# Patient Record
Sex: Female | Born: 1935 | Race: White | Hispanic: No | Marital: Married | Smoking: Former smoker
Health system: Southern US, Community
[De-identification: ages and names within clinical notes are randomized; demographics above are authoritative.]

## PROBLEM LIST (undated history)

## (undated) DIAGNOSIS — M1990 Unspecified osteoarthritis, unspecified site: Secondary | ICD-10-CM

## (undated) DIAGNOSIS — E785 Hyperlipidemia, unspecified: Secondary | ICD-10-CM

## (undated) DIAGNOSIS — G4733 Obstructive sleep apnea (adult) (pediatric): Secondary | ICD-10-CM

## (undated) DIAGNOSIS — J189 Pneumonia, unspecified organism: Secondary | ICD-10-CM

## (undated) DIAGNOSIS — E079 Disorder of thyroid, unspecified: Secondary | ICD-10-CM

## (undated) DIAGNOSIS — I447 Left bundle-branch block, unspecified: Secondary | ICD-10-CM

## (undated) DIAGNOSIS — I4891 Unspecified atrial fibrillation: Secondary | ICD-10-CM

## (undated) HISTORY — PX: CYSTOSCOPY: SUR368

## (undated) HISTORY — PX: ABDOMINAL HYSTERECTOMY: SHX81

## (undated) HISTORY — DX: Left bundle-branch block, unspecified: I44.7

## (undated) HISTORY — DX: Unspecified atrial fibrillation: I48.91

## (undated) HISTORY — DX: Disorder of thyroid, unspecified: E07.9

## (undated) HISTORY — DX: Hyperlipidemia, unspecified: E78.5

## (undated) HISTORY — DX: Unspecified osteoarthritis, unspecified site: M19.90

## (undated) HISTORY — DX: Obstructive sleep apnea (adult) (pediatric): G47.33

---

## 1964-06-04 HISTORY — PX: CHOLECYSTECTOMY: SHX55

## 1964-06-04 HISTORY — PX: APPENDECTOMY: SHX54

## 1997-11-11 ENCOUNTER — Emergency Department (HOSPITAL_COMMUNITY): Admission: EM | Admit: 1997-11-11 | Discharge: 1997-11-11 | Payer: Self-pay | Admitting: Emergency Medicine

## 1998-04-07 ENCOUNTER — Ambulatory Visit (HOSPITAL_COMMUNITY): Admission: RE | Admit: 1998-04-07 | Discharge: 1998-04-07 | Payer: Self-pay | Admitting: Orthopedic Surgery

## 1998-07-08 ENCOUNTER — Encounter: Payer: Self-pay | Admitting: Cardiology

## 1998-07-08 ENCOUNTER — Ambulatory Visit (HOSPITAL_COMMUNITY): Admission: RE | Admit: 1998-07-08 | Discharge: 1998-07-08 | Payer: Self-pay | Admitting: Cardiology

## 2000-05-10 ENCOUNTER — Encounter: Payer: Self-pay | Admitting: Internal Medicine

## 2000-05-10 ENCOUNTER — Ambulatory Visit (HOSPITAL_COMMUNITY): Admission: RE | Admit: 2000-05-10 | Discharge: 2000-05-10 | Payer: Self-pay | Admitting: Internal Medicine

## 2002-06-04 HISTORY — PX: COLONOSCOPY: SHX174

## 2003-02-25 ENCOUNTER — Encounter: Payer: Self-pay | Admitting: Internal Medicine

## 2003-02-25 ENCOUNTER — Ambulatory Visit (HOSPITAL_COMMUNITY): Admission: RE | Admit: 2003-02-25 | Discharge: 2003-02-25 | Payer: Self-pay | Admitting: Internal Medicine

## 2003-06-05 DIAGNOSIS — E785 Hyperlipidemia, unspecified: Secondary | ICD-10-CM

## 2003-06-05 HISTORY — DX: Hyperlipidemia, unspecified: E78.5

## 2003-08-05 ENCOUNTER — Encounter: Payer: Self-pay | Admitting: Internal Medicine

## 2003-12-21 ENCOUNTER — Encounter: Payer: Self-pay | Admitting: Internal Medicine

## 2004-01-05 ENCOUNTER — Encounter: Admission: RE | Admit: 2004-01-05 | Discharge: 2004-04-04 | Payer: Self-pay | Admitting: Internal Medicine

## 2005-04-17 ENCOUNTER — Ambulatory Visit: Payer: Self-pay | Admitting: Internal Medicine

## 2005-04-30 ENCOUNTER — Ambulatory Visit: Payer: Self-pay | Admitting: Internal Medicine

## 2005-05-04 ENCOUNTER — Ambulatory Visit: Payer: Self-pay | Admitting: Cardiovascular Disease

## 2005-05-15 ENCOUNTER — Ambulatory Visit: Payer: Self-pay | Admitting: Pulmonary Disease

## 2005-06-06 ENCOUNTER — Ambulatory Visit (HOSPITAL_BASED_OUTPATIENT_CLINIC_OR_DEPARTMENT_OTHER): Admission: RE | Admit: 2005-06-06 | Discharge: 2005-06-06 | Payer: Self-pay | Admitting: Pulmonary Disease

## 2005-06-13 ENCOUNTER — Ambulatory Visit: Payer: Self-pay | Admitting: Pulmonary Disease

## 2005-07-05 ENCOUNTER — Ambulatory Visit: Payer: Self-pay | Admitting: Pulmonary Disease

## 2005-08-10 ENCOUNTER — Ambulatory Visit: Payer: Self-pay | Admitting: Pulmonary Disease

## 2005-09-04 ENCOUNTER — Ambulatory Visit: Payer: Self-pay | Admitting: Pulmonary Disease

## 2005-11-08 ENCOUNTER — Ambulatory Visit: Payer: Self-pay | Admitting: Pulmonary Disease

## 2006-07-05 ENCOUNTER — Ambulatory Visit (HOSPITAL_COMMUNITY): Admission: RE | Admit: 2006-07-05 | Discharge: 2006-07-05 | Payer: Self-pay | Admitting: Internal Medicine

## 2006-07-05 LAB — HM MAMMOGRAPHY: HM Mammogram: NORMAL

## 2006-07-25 ENCOUNTER — Ambulatory Visit: Payer: Self-pay | Admitting: Internal Medicine

## 2006-08-01 ENCOUNTER — Ambulatory Visit: Payer: Self-pay | Admitting: Internal Medicine

## 2006-08-01 LAB — CONVERTED CEMR LAB
BUN: 19 mg/dL (ref 6–23)
Creatinine, Ser: 0.9 mg/dL (ref 0.4–1.2)
Glucose, Bld: 78 mg/dL (ref 70–99)
Potassium: 3.6 meq/L (ref 3.5–5.1)
TSH: 6.05 microintl units/mL — ABNORMAL HIGH (ref 0.35–5.50)

## 2007-07-10 DIAGNOSIS — J84111 Idiopathic interstitial pneumonia, not otherwise specified: Secondary | ICD-10-CM | POA: Insufficient documentation

## 2007-07-10 DIAGNOSIS — Z87448 Personal history of other diseases of urinary system: Secondary | ICD-10-CM

## 2007-07-10 DIAGNOSIS — M1990 Unspecified osteoarthritis, unspecified site: Secondary | ICD-10-CM | POA: Insufficient documentation

## 2007-07-14 ENCOUNTER — Ambulatory Visit: Payer: Self-pay | Admitting: Internal Medicine

## 2007-07-14 DIAGNOSIS — T887XXA Unspecified adverse effect of drug or medicament, initial encounter: Secondary | ICD-10-CM

## 2007-07-14 DIAGNOSIS — E039 Hypothyroidism, unspecified: Secondary | ICD-10-CM | POA: Insufficient documentation

## 2007-07-14 DIAGNOSIS — R002 Palpitations: Secondary | ICD-10-CM | POA: Insufficient documentation

## 2007-07-14 DIAGNOSIS — R079 Chest pain, unspecified: Secondary | ICD-10-CM

## 2007-07-14 DIAGNOSIS — E782 Mixed hyperlipidemia: Secondary | ICD-10-CM

## 2007-07-21 ENCOUNTER — Ambulatory Visit: Payer: Self-pay | Admitting: Cardiology

## 2007-07-21 LAB — CONVERTED CEMR LAB
INR: 0.9 (ref 0.8–1.0)
Prothrombin Time: 11.3 s (ref 10.9–13.3)
aPTT: 24.3 s (ref 21.7–29.8)

## 2007-07-24 ENCOUNTER — Encounter (INDEPENDENT_AMBULATORY_CARE_PROVIDER_SITE_OTHER): Payer: Self-pay | Admitting: *Deleted

## 2007-07-24 ENCOUNTER — Ambulatory Visit: Payer: Self-pay | Admitting: Cardiology

## 2007-07-24 ENCOUNTER — Ambulatory Visit: Payer: Self-pay | Admitting: Internal Medicine

## 2007-07-26 LAB — CONVERTED CEMR LAB
ALT: 15 units/L (ref 0–35)
AST: 24 units/L (ref 0–37)
BUN: 16 mg/dL (ref 6–23)
Cholesterol: 182 mg/dL (ref 0–200)
Creatinine, Ser: 1 mg/dL (ref 0.4–1.2)
HDL: 71.5 mg/dL (ref >39.0)
LDL Cholesterol: 95 mg/dL (ref 0–99)
Potassium: 4 meq/L (ref 3.5–5.1)
TSH: 3.67 microintl units/mL (ref 0.35–5.50)
Total CHOL/HDL Ratio: 2.5
Triglycerides: 77 mg/dL (ref 0–149)
VLDL: 15 mg/dL (ref 0–40)

## 2007-07-30 ENCOUNTER — Ambulatory Visit: Payer: Self-pay | Admitting: Internal Medicine

## 2007-08-07 ENCOUNTER — Ambulatory Visit: Payer: Self-pay | Admitting: Cardiovascular Disease

## 2007-08-15 ENCOUNTER — Ambulatory Visit: Payer: Self-pay | Admitting: Internal Medicine

## 2007-08-21 ENCOUNTER — Encounter: Payer: Self-pay | Admitting: Cardiology

## 2007-08-21 ENCOUNTER — Ambulatory Visit: Payer: Self-pay

## 2007-08-29 ENCOUNTER — Ambulatory Visit: Payer: Self-pay | Admitting: Cardiology

## 2007-09-02 ENCOUNTER — Ambulatory Visit: Payer: Self-pay | Admitting: Cardiology

## 2007-09-18 ENCOUNTER — Ambulatory Visit: Payer: Self-pay | Admitting: Cardiology

## 2007-10-10 ENCOUNTER — Ambulatory Visit: Payer: Self-pay | Admitting: Cardiology

## 2007-11-11 ENCOUNTER — Ambulatory Visit: Payer: Self-pay | Admitting: Internal Medicine

## 2007-12-01 ENCOUNTER — Ambulatory Visit: Payer: Self-pay | Admitting: Cardiology

## 2007-12-29 ENCOUNTER — Ambulatory Visit: Payer: Self-pay | Admitting: Cardiovascular Disease

## 2008-01-28 ENCOUNTER — Ambulatory Visit: Payer: Self-pay | Admitting: Cardiovascular Disease

## 2008-02-05 ENCOUNTER — Ambulatory Visit: Payer: Self-pay | Admitting: Cardiology

## 2008-02-17 ENCOUNTER — Ambulatory Visit: Payer: Self-pay | Admitting: Cardiology

## 2008-03-10 ENCOUNTER — Ambulatory Visit: Payer: Self-pay | Admitting: Internal Medicine

## 2008-04-07 ENCOUNTER — Ambulatory Visit: Payer: Self-pay | Admitting: Cardiovascular Disease

## 2008-05-05 ENCOUNTER — Ambulatory Visit: Payer: Self-pay | Admitting: Internal Medicine

## 2008-06-02 ENCOUNTER — Ambulatory Visit: Payer: Self-pay | Admitting: Internal Medicine

## 2008-09-17 ENCOUNTER — Telehealth (INDEPENDENT_AMBULATORY_CARE_PROVIDER_SITE_OTHER): Payer: Self-pay | Admitting: *Deleted

## 2008-11-03 ENCOUNTER — Encounter: Payer: Self-pay | Admitting: *Deleted

## 2008-12-08 ENCOUNTER — Encounter: Payer: Self-pay | Admitting: *Deleted

## 2008-12-13 ENCOUNTER — Ambulatory Visit: Payer: Self-pay | Admitting: Cardiology

## 2008-12-13 ENCOUNTER — Encounter (INDEPENDENT_AMBULATORY_CARE_PROVIDER_SITE_OTHER): Payer: Self-pay | Admitting: Cardiology

## 2008-12-13 LAB — CONVERTED CEMR LAB
POC INR: 1.7
Prothrombin Time: 16.2 s

## 2009-01-03 ENCOUNTER — Telehealth (INDEPENDENT_AMBULATORY_CARE_PROVIDER_SITE_OTHER): Payer: Self-pay | Admitting: *Deleted

## 2009-01-18 ENCOUNTER — Ambulatory Visit: Payer: Self-pay | Admitting: Internal Medicine

## 2009-01-18 DIAGNOSIS — I4891 Unspecified atrial fibrillation: Secondary | ICD-10-CM | POA: Insufficient documentation

## 2009-01-18 LAB — CONVERTED CEMR LAB

## 2009-01-24 ENCOUNTER — Ambulatory Visit: Payer: Self-pay | Admitting: Internal Medicine

## 2009-01-24 LAB — CONVERTED CEMR LAB
AST: 20 units/L (ref 0–37)
Albumin: 3.4 g/dL — ABNORMAL LOW (ref 3.5–5.2)
Alkaline Phosphatase: 55 units/L (ref 39–117)
Basophils Absolute: 0.1 10*3/uL (ref 0.0–0.1)
Calcium: 9.3 mg/dL (ref 8.4–10.5)
Cholesterol: 207 mg/dL — ABNORMAL HIGH (ref 0–200)
Direct LDL: 116.2 mg/dL
GFR calc non Af Amer: 51.45 mL/min (ref >60)
HCT: 40.9 % (ref 36.0–46.0)
Lymphs Abs: 3.1 10*3/uL (ref 0.7–4.0)
MCV: 96.7 fL (ref 78.0–100.0)
Monocytes Absolute: 0.8 10*3/uL (ref 0.1–1.0)
Nitrite: NEGATIVE
Platelets: 276 10*3/uL (ref 150.0–400.0)
RDW: 13.5 % (ref 11.5–14.6)
Sodium: 141 meq/L (ref 135–145)
Total Bilirubin: 0.6 mg/dL (ref 0.3–1.2)
Total CHOL/HDL Ratio: 3
Urobilinogen, UA: 0.2 (ref 0.0–1.0)

## 2009-02-01 ENCOUNTER — Telehealth (INDEPENDENT_AMBULATORY_CARE_PROVIDER_SITE_OTHER): Payer: Self-pay | Admitting: *Deleted

## 2009-02-02 ENCOUNTER — Ambulatory Visit: Payer: Self-pay | Admitting: Internal Medicine

## 2009-02-02 ENCOUNTER — Telehealth (INDEPENDENT_AMBULATORY_CARE_PROVIDER_SITE_OTHER): Payer: Self-pay | Admitting: *Deleted

## 2009-02-02 LAB — CONVERTED CEMR LAB
OCCULT 3: NEGATIVE
OCCULT 4: NEGATIVE

## 2009-02-03 ENCOUNTER — Ambulatory Visit: Payer: Self-pay | Admitting: Internal Medicine

## 2009-02-03 DIAGNOSIS — D721 Eosinophilia: Secondary | ICD-10-CM

## 2009-02-03 DIAGNOSIS — K921 Melena: Secondary | ICD-10-CM

## 2009-02-17 ENCOUNTER — Encounter: Payer: Self-pay | Admitting: Cardiology

## 2009-03-03 ENCOUNTER — Ambulatory Visit: Payer: Self-pay | Admitting: Internal Medicine

## 2009-03-03 DIAGNOSIS — J45901 Unspecified asthma with (acute) exacerbation: Secondary | ICD-10-CM | POA: Insufficient documentation

## 2009-03-03 LAB — CONVERTED CEMR LAB: INR: 1.6

## 2009-03-08 ENCOUNTER — Encounter (INDEPENDENT_AMBULATORY_CARE_PROVIDER_SITE_OTHER): Payer: Self-pay | Admitting: *Deleted

## 2009-03-08 ENCOUNTER — Ambulatory Visit: Payer: Self-pay | Admitting: Internal Medicine

## 2009-03-08 DIAGNOSIS — R0602 Shortness of breath: Secondary | ICD-10-CM

## 2009-03-08 LAB — CONVERTED CEMR LAB
BUN: 30 mg/dL — ABNORMAL HIGH (ref 6–23)
Basophils Absolute: 0.1 10*3/uL (ref 0.0–0.1)
Creatinine, Ser: 1.3 mg/dL — ABNORMAL HIGH (ref 0.4–1.2)
Eosinophils Absolute: 0.8 10*3/uL — ABNORMAL HIGH (ref 0.0–0.7)
HCT: 44 % (ref 36.0–46.0)
INR: 3.9 — ABNORMAL HIGH (ref 0.8–1.0)
Lymphs Abs: 4 10*3/uL (ref 0.7–4.0)
Monocytes Absolute: 1.1 10*3/uL — ABNORMAL HIGH (ref 0.1–1.0)
Monocytes Relative: 9 % (ref 3.0–12.0)
Platelets: 319 10*3/uL (ref 150.0–400.0)
RDW: 13.1 % (ref 11.5–14.6)

## 2009-03-09 ENCOUNTER — Encounter (INDEPENDENT_AMBULATORY_CARE_PROVIDER_SITE_OTHER): Payer: Self-pay | Admitting: *Deleted

## 2009-03-10 ENCOUNTER — Telehealth: Payer: Self-pay | Admitting: Internal Medicine

## 2009-03-11 ENCOUNTER — Ambulatory Visit: Payer: Self-pay | Admitting: Internal Medicine

## 2009-03-15 ENCOUNTER — Telehealth: Payer: Self-pay | Admitting: Cardiology

## 2009-03-15 ENCOUNTER — Ambulatory Visit: Payer: Self-pay | Admitting: Internal Medicine

## 2009-03-15 ENCOUNTER — Encounter (INDEPENDENT_AMBULATORY_CARE_PROVIDER_SITE_OTHER): Payer: Self-pay | Admitting: *Deleted

## 2009-03-16 ENCOUNTER — Telehealth (INDEPENDENT_AMBULATORY_CARE_PROVIDER_SITE_OTHER): Payer: Self-pay | Admitting: *Deleted

## 2009-03-16 ENCOUNTER — Encounter (INDEPENDENT_AMBULATORY_CARE_PROVIDER_SITE_OTHER): Payer: Self-pay | Admitting: *Deleted

## 2009-03-16 LAB — CONVERTED CEMR LAB
Creatinine, Ser: 1.2 mg/dL (ref 0.4–1.2)
INR: 3 — ABNORMAL HIGH (ref 0.8–1.0)
Potassium: 3.6 meq/L (ref 3.5–5.1)
Prothrombin Time: 30.5 s — ABNORMAL HIGH (ref 9.1–11.7)

## 2009-03-24 ENCOUNTER — Telehealth: Payer: Self-pay | Admitting: Cardiology

## 2009-04-06 ENCOUNTER — Ambulatory Visit: Payer: Self-pay | Admitting: Internal Medicine

## 2009-04-20 ENCOUNTER — Ambulatory Visit: Payer: Self-pay | Admitting: Internal Medicine

## 2009-04-20 DIAGNOSIS — J8410 Pulmonary fibrosis, unspecified: Secondary | ICD-10-CM

## 2009-04-20 DIAGNOSIS — K219 Gastro-esophageal reflux disease without esophagitis: Secondary | ICD-10-CM | POA: Insufficient documentation

## 2009-04-20 DIAGNOSIS — K5900 Constipation, unspecified: Secondary | ICD-10-CM | POA: Insufficient documentation

## 2009-04-24 ENCOUNTER — Encounter: Payer: Self-pay | Admitting: Internal Medicine

## 2009-06-09 ENCOUNTER — Telehealth (INDEPENDENT_AMBULATORY_CARE_PROVIDER_SITE_OTHER): Payer: Self-pay | Admitting: *Deleted

## 2009-06-10 ENCOUNTER — Ambulatory Visit: Payer: Self-pay | Admitting: Internal Medicine

## 2009-06-10 LAB — HM COLONOSCOPY

## 2009-07-15 ENCOUNTER — Telehealth (INDEPENDENT_AMBULATORY_CARE_PROVIDER_SITE_OTHER): Payer: Self-pay | Admitting: *Deleted

## 2009-07-15 ENCOUNTER — Ambulatory Visit: Payer: Self-pay | Admitting: Internal Medicine

## 2009-08-08 ENCOUNTER — Ambulatory Visit: Payer: Self-pay | Admitting: Internal Medicine

## 2009-08-22 ENCOUNTER — Telehealth: Payer: Self-pay | Admitting: Internal Medicine

## 2009-09-05 ENCOUNTER — Ambulatory Visit: Payer: Self-pay | Admitting: Internal Medicine

## 2009-09-21 ENCOUNTER — Ambulatory Visit: Payer: Self-pay | Admitting: Internal Medicine

## 2009-09-22 ENCOUNTER — Ambulatory Visit: Payer: Self-pay | Admitting: Internal Medicine

## 2009-09-26 ENCOUNTER — Telehealth: Payer: Self-pay | Admitting: Internal Medicine

## 2009-10-03 ENCOUNTER — Ambulatory Visit: Payer: Self-pay | Admitting: Internal Medicine

## 2009-10-17 ENCOUNTER — Telehealth (INDEPENDENT_AMBULATORY_CARE_PROVIDER_SITE_OTHER): Payer: Self-pay | Admitting: *Deleted

## 2009-11-03 ENCOUNTER — Ambulatory Visit: Payer: Self-pay | Admitting: Internal Medicine

## 2010-01-26 ENCOUNTER — Ambulatory Visit: Payer: Self-pay | Admitting: Internal Medicine

## 2010-01-26 LAB — CONVERTED CEMR LAB: POC INR: 2.2

## 2010-04-07 ENCOUNTER — Ambulatory Visit: Payer: Self-pay | Admitting: Internal Medicine

## 2010-04-07 LAB — CONVERTED CEMR LAB: POC INR: 1.2

## 2010-04-13 ENCOUNTER — Ambulatory Visit: Payer: Self-pay | Admitting: Internal Medicine

## 2010-04-13 LAB — CONVERTED CEMR LAB: INR: 2.7

## 2010-05-04 ENCOUNTER — Ambulatory Visit: Payer: Self-pay | Admitting: Internal Medicine

## 2010-05-04 LAB — CONVERTED CEMR LAB: INR: 1.5

## 2010-05-11 ENCOUNTER — Ambulatory Visit: Payer: Self-pay | Admitting: Internal Medicine

## 2010-05-11 LAB — CONVERTED CEMR LAB: INR: 1.9

## 2010-05-22 ENCOUNTER — Ambulatory Visit: Payer: Self-pay | Admitting: Internal Medicine

## 2010-05-26 ENCOUNTER — Ambulatory Visit: Payer: Self-pay | Admitting: Internal Medicine

## 2010-06-16 ENCOUNTER — Encounter: Payer: Self-pay | Admitting: Internal Medicine

## 2010-06-16 ENCOUNTER — Ambulatory Visit
Admission: RE | Admit: 2010-06-16 | Discharge: 2010-06-16 | Payer: Self-pay | Source: Home / Self Care | Attending: Internal Medicine | Admitting: Internal Medicine

## 2010-06-17 ENCOUNTER — Encounter: Payer: Self-pay | Admitting: Internal Medicine

## 2010-06-28 ENCOUNTER — Encounter: Payer: Self-pay | Admitting: Cardiovascular Disease

## 2010-07-04 ENCOUNTER — Other Ambulatory Visit: Payer: Self-pay | Admitting: Internal Medicine

## 2010-07-04 ENCOUNTER — Ambulatory Visit
Admission: RE | Admit: 2010-07-04 | Discharge: 2010-07-04 | Payer: Self-pay | Source: Home / Self Care | Attending: Internal Medicine | Admitting: Internal Medicine

## 2010-07-04 LAB — CBC WITH DIFFERENTIAL/PLATELET
Eosinophils Relative: 8.8 % — ABNORMAL HIGH (ref 0.0–5.0)
HCT: 38.5 % (ref 36.0–46.0)
Hemoglobin: 12.9 g/dL (ref 12.0–15.0)
Lymphs Abs: 2.3 10*3/uL (ref 0.7–4.0)
MCV: 97.5 fl (ref 78.0–100.0)
Monocytes Relative: 10.6 % (ref 3.0–12.0)
Neutro Abs: 3.4 10*3/uL (ref 1.4–7.7)
WBC: 7.2 10*3/uL (ref 4.5–10.5)

## 2010-07-04 LAB — LIPID PANEL
HDL: 80.2 mg/dL (ref >39.00)
LDL Cholesterol: 98 mg/dL (ref 0–99)
Total CHOL/HDL Ratio: 2

## 2010-07-04 LAB — HEPATIC FUNCTION PANEL
Albumin: 3.3 g/dL — ABNORMAL LOW (ref 3.5–5.2)
Alkaline Phosphatase: 48 U/L (ref 39–117)
Total Bilirubin: 0.2 mg/dL — ABNORMAL LOW (ref 0.3–1.2)

## 2010-07-04 LAB — BASIC METABOLIC PANEL
BUN: 21 mg/dL (ref 6–23)
CO2: 29 mEq/L (ref 19–32)
Chloride: 105 mEq/L (ref 96–112)
Creatinine, Ser: 1 mg/dL (ref 0.4–1.2)
Glucose, Bld: 78 mg/dL (ref 70–99)

## 2010-07-04 NOTE — Progress Notes (Signed)
Summary: Refill Request  Phone Note Refill Request Call back at Home Phone [phone] Message from:  Patient   Medication #2:  ADVAIR DISKUS 100-50 MCG/DOSE AEPB 1 inhalation every 12 hours [facility]   Method Requested: Fax to Local Pharmacy Initial call taken by: Shonna Chock,  Oct 17, 2009 4:42 PM    New/Updated Medications: HYDROCHLOROTHIAZIDE 25 MG  TABS (HYDROCHLOROTHIAZIDE) Hold Prescriptions: WARFARIN SODIUM 5 MG TABS (WARFARIN SODIUM) Use as directed by anticoagulation clinic.  #30 Each x 2   Entered by:   Shonna Chock   Authorized by:   Marga Melnick MD   Signed by:   Shonna Chock on 10/17/2009   Method used:   Electronically to        [facility]  [phone]* (retail)       [address]       [place], [place]  [address]       Ph: [phone] or [phone]       Fax: [phone]   RxID:   [id number] ADVAIR DISKUS 100-50 MCG/DOSE AEPB (FLUTICASONE-SALMETEROL) 1 inhalation every 12 hours  #1 x 3   Entered by:   Shonna Chock   Authorized by:   Marga Melnick MD   Signed by:   Shonna Chock on 10/17/2009   Method used:   Electronically to        [facility]  [phone]* (retail)       [address]       [place], [place]  [address]       Ph: [phone] or [phone]       Fax: [phone]   RxID:   [id number]

## 2010-07-04 NOTE — Procedures (Signed)
Summary: Colonoscopy  Patient: [name] Note: All result statuses are Final unless otherwise noted.  Tests: (1) Colonoscopy (COL)   COL Colonoscopy           DONE     [facility]     [address]     [place], [place]  [address]           COLONOSCOPY PROCEDURE REPORT           PATIENT:  [name], [name]  MR#:  [id number]     BIRTHDATE:  06/23/1931, 77 yrs. old  GENDER:  female           ENDOSCOPIST:  Hedwig Morton. Juanda Chance, MD     Referred by:  Marga Melnick, M.D.           PROCEDURE DATE:  06/10/2009     PROCEDURE:  Colonoscopy 62[address]     ASA CLASS:  Class II     INDICATIONS:  FOBT positive stool one out of 6 cards positive for     blood, Hgb 14.6. Pt on Coumadin for AF, D/C'd 5 days ago           MEDICATIONS:   Versed 5 mg, Fentanyl 50 mcg           DESCRIPTION OF PROCEDURE:   After the risks benefits and     alternatives of the procedure were thoroughly explained, informed     consent was obtained.  Digital rectal exam was performed and     revealed no rectal masses.   The LB [id number] endoscope     was introduced through the anus and advanced to the cecum, which     was identified by both the appendix and ileocecal valve, without     limitations.  The quality of the prep was good, using MiraLax.     The instrument was then slowly withdrawn as the colon was fully     examined.     <<PROCEDUREIMAGES>>           FINDINGS:  Moderate diverticulosis was found sigmoid to descending     This was otherwise a normal examination of the colon (see image2,     image3, image4, and image5).   Retroflexed views in the rectum     revealed no abnormalities.    The scope was then withdrawn from     the patient and the procedure completed.           COMPLICATIONS:  None           ENDOSCOPIC IMPRESSION:     1) Moderate diverticulosis in the sigmoid to descending     2) Otherwise normal examination     RECOMMENDATIONS:     1) high fiber diet     Resume Coumadin today  follow hemoccults, if positive, consider EGD           REPEAT EXAM:  In 10 year(s) for.           ______________________________     Hedwig Morton. Juanda Chance, MD           CC:           n.     eSIGNED:   Hedwig Morton. Kenniya Westrich at 06/10/2009 09:42 AM           Tawanna Cooler, [phone]  Note: An exclamation mark (!) indicates a result that was not dispersed into the flowsheet. Document Creation Date: 06/10/2009 9:42 AM _______________________________________________________________________  (1) Order result  status: Final Collection or observation date-time: 06/10/2009 09:09 Requested date-time:  Receipt date-time:  Reported date-time:  Referring Physician:   Ordering Physician: Lina Sar [phone]) Specimen Source:  Source: Launa Grill Order Number: [id number] Lab site:   Appended Document: Colonoscopy    Clinical Lists Changes  Observations: Added new observation of COLONNXTDUE: 06/2019 (06/10/2009 13:04)

## 2010-07-04 NOTE — Assessment & Plan Note (Signed)
Summary: nep/recurrant afib/jml  Medications Added HYDROCHLOROTHIAZIDE 25 MG  TABS (HYDROCHLOROTHIAZIDE) HOLD      Allergies Added: NKDA  Primary Provider:  Marga Melnick, MD  CC:  pt of Dr. Antoine Poche.  Recurrent Afib and referred  by Dr. Alwyn Ren.  History of Present Illness: [name] is seen at the request of Dr. Alwyn Ren because of recurrent symptomatic paroxysmal atrial fibrillation. This dates back a number of years. The  symptomatic episodes have been relatively infrequent occurring just a couple of times a year. It lasted 8-12 hours. They are associated with weakness and worsening shortness of breath; there has been no associated lightheadedness or chest pain. She has seen Dr. Antoine Poche for these in the past. he treated her with Cardizem and warfarin. A stress echo was undertaken a year or so ago and it was normal.  In early October she had a prolonged episode similar in quality but of 3-4 days in duration. She saw Dr. Alwyn Ren during the episode and atrial fibrillation was documented on ECG. We have reviewed that today. It is also noted that she was mildly hypokalemic attributed to her diuretic. Has also thought that she had some bronchitis. Antibiotics were prescribed. After 3-4 days the episode terminated. She feels now that she is having some irregularities.  She has chronic dyspnea on exertion attributed to pulmonary fibrosis. This has been stable for a number of years. When she walks she sometimes gets a sensation of a "dill pickle" in her throat with these spells; this is relieved by rest.  her CHADS VASC score is 3, age x 2 and gender; she denies diabetes hypertension or prior stroke  Current Medications (verified): 1)  Synthroid 50 Mcg  Tabs (Levothyroxine Sodium) .... Take 1 Tablet Daily Except Take 1 1/2 Tablets On Tues,th, & Sat. 2)  Advair Hfa 115-21 Mcg/act  Aero (Fluticasone-Salmeterol) .... Use 1-2 Puffs Daily 3)  Hydrochlorothiazide 25 Mg  Tabs (Hydrochlorothiazide) ....  1/2 Tab Once Daily 4)  Advil Prn 5)  Warfarin Sodium 5 Mg Tabs (Warfarin Sodium) .... Use As Directed By Anticoagulation Clinic. 6)  Cardizem 120 Mg Tabs (Diltiazem Hcl) .... Take One Tablet Daily 7)  Advair Diskus 100-50 Mcg/dose Aepb (Fluticasone-Salmeterol) .Marland Kitchen.. 1 Inhalation Every 12 Hours  Allergies (verified): No Known Drug Allergies  Vital Signs:  Patient profile:   77 year old female Height:      66.5 inches Weight:      186 pounds BMI:     29.68 Pulse rate:   64 / minute Pulse rhythm:   irregular BP sitting:   132 / 64  (right arm) Cuff size:   regular  Vitals Entered By: Judithe Modest CMA (April 06, 2009 11:06 AM)  Physical Exam  General:  Alert and oriented in no acute distress. HEENT  normal . Neck veins were flat; carotids brisk and full without bruits. No lymphadenopathy. Back without kyphosis. Lungs clear. Heart sounds were somewhat irregular with a 2/6 early systolic murmur and an S4 . PMI nondisplaced. Abdomen soft with active bowel sounds with  midline pulsation present; there was nohepatomegaly. Femoral pulses and distal pulses intact. Extremities were without clubbing cyanosis or edemaSkin warm and dry. Neurological exam grossly normal    Impression & Recommendations:  Problem # 1:  ATRIAL FIBRILLATION (ICD-427.31) The patient has had recurrent episodes of atrial fibrillation mostly of relatively short duration. The episode in October was much longer and that is more symptomatic. Potential contributors to that episode might have included hypokalemia and/or the  bronchitis. It may also reflect a change natural history of atrial fibrillation as it is known that many people with paroxysmal atrial fibrillation will evolve to persistent and then to permanent atrial fibrillation.  Given the relative infrequency of the episodes, I think at this point is not necessary to undertake daily antiarrhythmic therapy to prevent recurrences. If the frequency were to change  significantly, daily therapy could be considered. Given the fact that she was significantly symptomatic with a prolonged episode the idea of "pill in the pocket" her prolonged episode becomes attractive. This is done using flecainide and or Rythmol as a one-time dose. We have traditionally asked people to have this done the first time in the emergency room to make sure there is no proarrhythmia; thereafter it can be done at home.  In addition because of the potential contribution of hypokalemia, and the lack of hypertension as a problem and the fact that the the HCTZ was initiated for edema associated with prolonged standing I elected to stop it today.she will record her blood pressures over the next month or so and keep an eye on her edema. If these require therapy, I would suggest that we use a potassium sparing diuretic instead. Her updated medication list for this problem includes:    Warfarin Sodium 5 Mg Tabs (Warfarin sodium) ..... Use as directed by anticoagulation clinic.  Orders: EKG w/ Interpretation (93[address]  Problem # 2:  PALPITATIONS (ICD-785.1) Sis having palpitations today. Her electrocardiogram is notable for some variation in P wave morphology suggesting multiple atrial foci. This may be a harbinger of her atrial fibrillation; may also be contributing to her palpitations.  Problem # 3:  CHEST PAIN, EXERTIONAL (ICD-786.50)  Problem # 4:  CHEST PAIN, EXERTIONAL (ICD-786.50)  this is unlikely ischemia with negative stress echo a few years ago; It is likely pulmonary attriubtuable to her pulmonary fibrosis;  if she were to continue to have symptoims, esp if they were to become inccreasingly a problem, I would have a low threshold for undertaking scanning  Her updated medication list for this problem includes:    Warfarin Sodium 5 Mg Tabs (Warfarin sodium) ..... Use as directed by anticoagulation clinic.    Cardizem 120 Mg Tabs (Diltiazem hcl) .Marland Kitchen... Take one tablet daily  Other  Orders: EKG w/ Interpretation ([address])  Patient Instructions: 1)  You were given a prescription today to carry with you at all times. You should present it if you have to go to the ER with AF. They will give you 1 oral dose of Flecainide 300mg  and observe you for 6 hours prior to discharging you home.  2)  Your physician recommends that you schedule a follow-up appointment in: 6 months with Dr. [name], pt no longer needs to see Dr. Antoine Poche 3)  Your physician has recommended you make the following change in your medication: HOLD your HCTZ for now.

## 2010-07-04 NOTE — Progress Notes (Signed)
Summary: f/u coumadin  Phone Note Call from Patient   Caller: Patient Summary of Call: Pt was due to have PT check this week however pt missed about 4 doses last week and would like to know if she should continue current regimen of 5mg  daily except 7.5mg  on Wed and have it check on next week or should she still come in this week to have it check pt was 2.5 on 08-08-09 and 1.54 on  07/15/2009. pls advise .............Marland KitchenFelecia Deloach CMA  August 22, 2009 10:01 AM   Follow-up for Phone Call        per dr Adelia Baptista pt to resume usual schedule and check PT/INR in 1 week.............Marland KitchenFelecia Deloach CMA  August 22, 2009 11:19 AM   Additional Follow-up for Phone Call Additional follow up Details #1::        spoke with pt husband advise him on dr Taleya Whitcher suggestion and to have pt to call to schedule PT/INR in 1 week..............Marland KitchenFelecia Deloach CMA  August 22, 2009 11:21 AM

## 2010-07-04 NOTE — Assessment & Plan Note (Signed)
Summary: congested,cough/cbs   Vital Signs:  Patient profile:   77 year old female Weight:      184.6 pounds O2 Sat:      88 % Temp:     98.0 degrees F oral Pulse rate:   71 / minute Resp:     17 per minute BP sitting:   130 / 80  (left arm) Cuff size:   large  Vitals Entered By: Shonna Chock (September 21, 2009 3:42 PM) CC: SOB, cough, and congestion x 4 weeks, 02 Stats increased to 92%, Cough Comments REVIEWED MED LIST, PATIENT AGREED DOSE AND INSTRUCTION CORRECT    Primary Care Provider:  Marga Melnick, MD   CC:  SOB, cough, and congestion x 4 weeks, 02 Stats increased to 92%, and Cough.  History of Present Illness:  Cough      This is a 77 year old woman who presents with Cough X 2 weeks.  The patient reports productive cough with thick / white secretions, shortness of breath, wheezing, exertional dyspnea, and malaise, but denies pleuritic chest pain, fever, and hemoptysis.  Associated symtpoms include cold/URI symptoms with facial pain and nasal congestion & purulence X 2-3 days only.  The patient denies the following symptoms: sore throat, chronic rhinitis, weight loss, acid reflux symptoms,PNDyspnea  and peripheral edema.  Ineffective prior treatments have included other asthma medication(Advair).  Risk factors include history of allergic rhinitis and history of reflux.  No PMH of astma; remote smoker.PT/INR was 2.00 on 09/05/2009.  Allergies (verified): No Known Drug Allergies  Review of Systems General:  Denies chills and sweats. ENT:  Complains of sinus pressure; denies difficulty swallowing, ear discharge, earache, and hoarseness. GI:  Denies indigestion. Allergy:  Complains of itching eyes and seasonal allergies; Minor extrinsic symptoms.  Physical Exam  General:  Appears tired but well-nourished,in no acute distress; alert,appropriate and cooperative throughout examination Ears:  External ear exam shows no significant lesions or deformities.  Otoscopic examination  reveals clear canals, tympanic membranes are intact bilaterally without bulging, retraction, inflammation or discharge. Hearing is grossly normal bilaterally. Nose:  External nasal examination shows no deformity or inflammation. Nasal mucosa are pink and moist without lesions or exudates. Minimal edemaL nare Mouth:  Oral mucosa and oropharynx without lesions or exudates.  Teeth in good repair. Lungs:  Normal respiratory effort, chest expands symmetrically. Lungs : diffuse musical pops , wheezes & rhonchi w/o increased WOB superimposed on background of dry rales. O2 sats initially 88% , subsequently 92% Heart:  Distant heart sounds,regular rhythm and bradycardia.  No NVD or HJR Extremities:  No clubbing, cyanosis. trace left pedal edema.     Impression & Recommendations:  Problem # 1:  BRONCHITIS-ACUTE (ICD-466.0)  RAD component Her updated medication list for this problem includes:    Advair Hfa 115-21 Mcg/act Aero (Fluticasone-salmeterol) ..... Use 1-2 puffs daily    Advair Diskus 100-50 Mcg/dose Aepb (Fluticasone-salmeterol) .Marland Kitchen... 1 inhalation every 12 hours    Symbicort 160-4.5 Mcg/act Aero (Budesonide-formoterol fumarate) .Marland Kitchen... 1-2 puffs every 12 hrs as needed; gargle  & spit after use    Proair Hfa 108 (90 Base) Mcg/act Aers (Albuterol sulfate) .Marland Kitchen... 1-2 puffs every 4 hrs as needed    Amoxicillin-pot Clavulanate 875-125 Mg Tabs (Amoxicillin-pot clavulanate) .Marland Kitchen... 1 every 12 hrs with a meal    Promethazine Vc/codeine 6.25-5-10 Mg/93ml Syrp (Phenyleph-promethazine-cod) .Marland Kitchen... 1 tsp every 6 hrs as needed cough  Orders: T-2 View CXR (71020TC)  Problem # 2:  PULMONARY FIBROSIS (ICD-515)  Orders: T-2 View  CXR (71020TC)  Problem # 3:  ATRIAL FIBRILLATION (ICD-427.31) PMH of Her updated medication list for this problem includes:    Warfarin Sodium 5 Mg Tabs (Warfarin sodium) ..... Use as directed by anticoagulation clinic.    Cardizem 120 Mg Tabs (Diltiazem hcl) .Marland Kitchen... Take one tablet  daily  Complete Medication List: 1)  Synthroid 50 Mcg Tabs (Levothyroxine sodium) .... Take 1 tablet daily except take 1 1/2 tablets on tues,th, & sat. 2)  Advair Hfa 115-21 Mcg/act Aero (Fluticasone-salmeterol) .... Use 1-2 puffs daily 3)  Hydrochlorothiazide 25 Mg Tabs (Hydrochlorothiazide) .... Hold 4)  Advil 200 Mg Tabs (Ibuprofen) .... As needed 5)  Warfarin Sodium 5 Mg Tabs (Warfarin sodium) .... Use as directed by anticoagulation clinic. 6)  Cardizem 120 Mg Tabs (Diltiazem hcl) .... Take one tablet daily 7)  Advair Diskus 100-50 Mcg/dose Aepb (Fluticasone-salmeterol) .Marland Kitchen.. 1 inhalation every 12 hours 8)  Symbicort 160-4.5 Mcg/act Aero (Budesonide-formoterol fumarate) .Marland Kitchen.. 1-2 puffs every 12 hrs as needed; gargle  & spit after use 9)  Proair Hfa 108 (90 Base) Mcg/act Aers (Albuterol sulfate) .Marland Kitchen.. 1-2 puffs every 4 hrs as needed 10)  Amoxicillin-pot Clavulanate 875-125 Mg Tabs (Amoxicillin-pot clavulanate) .Marland Kitchen.. 1 every 12 hrs with a meal 11)  Promethazine Vc/codeine 6.25-5-10 Mg/69ml Syrp (Phenyleph-promethazine-cod) .Marland Kitchen.. 1 tsp every 6 hrs as needed cough  Patient Instructions: 1)  Drink as much fluid as you can tolerate for the next few days. Prescriptions: PROMETHAZINE VC/CODEINE 6.25-5-10 MG/5ML SYRP (PHENYLEPH-PROMETHAZINE-COD) 1 tsp every 6 hrs as needed cough  #120 cc x 0   Entered and Authorized by:   Marga Melnick MD   Signed by:   Marga Melnick MD on 09/21/2009   Method used:   Printed then faxed to ...       [facility] [address]  [phone]* (retail)       [address]       [place], [place]  [address]       Ph: [phone] or [phone]       Fax: [phone]   RxID:   [id number] AMOXICILLIN-POT CLAVULANATE 875-125 MG TABS (AMOXICILLIN-POT CLAVULANATE) 1 every 12 hrs WITH A MEAL  #20 x 0   Entered and Authorized by:   Marga Melnick MD   Signed by:   Marga Melnick MD on 09/21/2009   Method used:   Faxed to ...       [facility] [address]  [phone]* (retail)       [address]       [place], [place]  [address]       Ph: [phone] or [phone]       Fax: [phone]   RxID:   [id number] PROAIR HFA 108 (90 BASE) MCG/ACT AERS (ALBUTEROL SULFATE) 1-2 puffs every 4 hrs as needed  #1 x 2   Entered and Authorized by:   Marga Melnick MD   Signed by:   Marga Melnick MD on 09/21/2009   Method used:   Samples Given   RxID:   [id number] SYMBICORT 160-4.5 MCG/ACT AERO (BUDESONIDE-FORMOTEROL FUMARATE) 1-2 puffs every 12 hrs as needed; GARGLE  & SPIT AFTER USE  #1 x 5   Entered and Authorized by:   Marga Melnick MD   Signed by:   Marga Melnick MD on 09/21/2009   Method used:   Samples Given   RxID:   [id number]

## 2010-07-04 NOTE — Assessment & Plan Note (Signed)
Summary: PT Check/scm   Nurse Visit   Vital Signs:  Patient profile:   77 year old female Height:      66.5 inches Weight:      185 pounds BMI:     29.52 Pulse rate:   70 / minute BP sitting:   120 / 66   Allergies: 1)  ! Amoxicillin  Orders Added: 1)  Est. Patient Level I [10272] 2)  Protime [53664QI]  Anticoagulant Therapy  Managed by: Marga Melnick, MD PCP: Marga Melnick, MD  Supervising MD: Jens Som MD, Arlys John Indication 1: Atrial fibrillation Lab Used: [place] Site: [address] INR POC 1.2 INR RANGE 2-3  Vital Signs: Weight: 185 lbs.  Pulse Rate: 70  Blood Pressure:  120 / 66   Dietary changes: no    Health status changes: no    Bleeding/hemorrhagic complications: no    Recent/future hospitalizations: no    Any changes in medication regimen? no    Recent/future dental: no  Any missed doses?: yes     Details: has been out for 2 days   Is patient compliant with meds? no     Details: last pt/inr was 01/26/10  Comments: current dose- 5mg  daily except 7.5mg  on wed.  NEW DOSE: per Hop 10mg  for next 2 days, then 7.5mg  daily recheck in 5 days.....Marland KitchenMarland KitchenDoristine Devoid CMA  April 07, 2010 10:41 AM  patient aware of dose and instructions.Marland KitchenMarland KitchenDoristine Devoid CMA  April 07, 2010 10:44 AM

## 2010-07-04 NOTE — Progress Notes (Signed)
Summary: med reaction  Phone Note Call from Patient   Summary of Call: pt seen on 09-21-09 for bronchitis and was rx AMOXICILLIN-POT CLAVULANATE 875-125 MG. pt states that she is taking med with food however it is causing her stomach to be upset and make her feel real bad. pt would like to know if there is another med that she can try. pls advise...............Marland KitchenFelecia Deloach CMA  September 26, 2009 3:47 PM   Follow-up for Phone Call        per dr Tomislav Micale pt can try metronidazole 500mg  tid #21 avoid alcohol...........Marland KitchenFelecia Deloach CMA  September 26, 2009 5:02 PM   spoke with pt husband advise him of med change and new rx sent to pharmacy. husband verified pharmacy and will inform pt...............Marland KitchenFelecia Deloach CMA  September 26, 2009 5:04 PM    New Allergies: ! AMOXICILLIN New/Updated Medications: METRONIDAZOLE 500 MG TABS (METRONIDAZOLE) take 1 tab three times a day *avoid alcohol* New Allergies: ! AMOXICILLINPrescriptions: METRONIDAZOLE 500 MG TABS (METRONIDAZOLE) take 1 tab three times a day *avoid alcohol*  #21 x 0   Entered by:   Jeremy Johann CMA   Authorized by:   Marga Melnick MD   Signed by:   Jeremy Johann CMA on 09/26/2009   Method used:   Faxed to ...       [facility] [address]  [phone]* (retail)       [address]       [place], [place]  [address]       Ph: [phone] or [phone]       Fax: [phone]   RxID:   [id number]

## 2010-07-04 NOTE — Assessment & Plan Note (Signed)
Summary: pt check///sph   Nurse Visit   Vital Signs:  Patient profile:   77 year old female Height:      66.5 inches (168.91 cm) Weight:      184 pounds (83.64 kg) BMI:     29.36 Temp:     98.2 degrees F (36.78 degrees C) oral BP sitting:   130 / 68  (left arm) Cuff size:   regular  Vitals Entered By: Lucious Groves CMA (April 13, 2010 9:44 AM)  Allergies: 1)  ! Amoxicillin Laboratory Results   Blood Tests      INR: 2.7   (Normal Range: 0.88-1.12   Therap INR: 2.0-3.5)    Orders Added: 1)  Est. Patient Level I [16109] 2)  Protime [60454UJ]   ANTICOAGULATION RECORD PREVIOUS REGIMEN & LAB RESULTS Anticoagulation Diagnosis:  Atrial fibrillation on  01/18/2009 Previous INR Goal Range:  2-3 on  01/18/2009 Previous INR:  2.5 on  08/08/2009 Previous Coumadin Dose(mg):  5mg  daily except 7.5mg  on Wed on  08/08/2009 Previous Regimen:  5mg  daily on  08/08/2009  NEW REGIMEN & LAB RESULTS Anticoag. Dx: Atrial fibrillation Current INR: 2.7 Current Coumadin Dose(mg): 10mg  2 days and 7.5 all other days Regimen: 5mg  qd except 7.5mg  on Wed.  Egidio Lofgren: Alwyn Ren Repeat testing in: 3 weeks Other Comments: Patient notes that she has 5mg  tabs as has taken 10mg  for two days, then 7.5mg  all other days.  **Per MD patient can go back to 5mg  once daily except for 7.5mg  on Wednesdays. Lucious Groves CMA  April 13, 2010 9:41 AM   Dose has been reviewed with patient or caretaker during this visit. Reviewed by: Lucious Groves  Anticoagulation Visit Questionnaire Coumadin dose missed/changed:  No Abnormal Bleeding Symptoms:  No  Any diet changes including alcohol intake, vegetables or greens since the last visit:  No Any illnesses or hospitalizations since the last visit:  No Any signs of clotting since the last visit (including chest discomfort, dizziness, shortness of breath, arm tingling, slurred speech, swelling or redness in leg):  No  MEDICATIONS SYNTHROID 50 MCG  TABS  (LEVOTHYROXINE SODIUM) Take 1 tablet daily except take 1 1/2 tablets on Tues,Th, & Sat. **APPOINTMENT DUE** ADVAIR HFA 115-21 MCG/ACT  AERO (FLUTICASONE-SALMETEROL) Use 1-2 puffs daily HYDROCHLOROTHIAZIDE 25 MG  TABS (HYDROCHLOROTHIAZIDE) Hold ADVIL 200 MG TABS (IBUPROFEN) as needed WARFARIN SODIUM 5 MG TABS (WARFARIN SODIUM) Use as directed by anticoagulation clinic. CARDIZEM 120 MG TABS (DILTIAZEM HCL) take one tablet daily ADVAIR DISKUS 100-50 MCG/DOSE AEPB (FLUTICASONE-SALMETEROL) 1 inhalation every 12 hours SYMBICORT 160-4.5 MCG/ACT AERO (BUDESONIDE-FORMOTEROL FUMARATE) 1-2 puffs every 12 hrs as needed; GARGLE  & SPIT AFTER USE PROAIR HFA 108 (90 BASE) MCG/ACT AERS (ALBUTEROL SULFATE) 1-2 puffs every 4 hrs as needed METRONIDAZOLE 500 MG TABS (METRONIDAZOLE) take 1 tab three times a day *avoid alcohol* PROMETHAZINE VC/CODEINE 6.25-5-10 MG/5ML SYRP (PHENYLEPH-PROMETHAZINE-COD) 1 tsp every 6 hrs as needed cough

## 2010-07-04 NOTE — Progress Notes (Signed)
Summary: Procedure tomorrow   Phone Note Call from Patient Call back at Home Phone [phone]   Call For: Dr Juanda Chance Summary of Call: Procedure tomorrow has a question for nurse. Initial call taken by: Leanor Kail [facility],  June 09, 2009 9:29 AM  Follow-up for Phone Call        pt's questions were answered. Follow-up by: Ezra Sites RN,  June 09, 2009 9:57 AM

## 2010-07-04 NOTE — Assessment & Plan Note (Signed)
Summary: pt check//lch   Nurse Visit   Allergies: 1)  ! Amoxicillin Laboratory Results   Blood Tests      INR: 1.9   (Normal Range: 0.88-1.12   Therap INR: 2.0-3.5)    Orders Added: 1)  Est. Patient Level I [16109] 2)  Protime [60454UJ]   ANTICOAGULATION RECORD PREVIOUS REGIMEN & LAB RESULTS Anticoagulation Diagnosis:  Atrial fibrillation on  05/04/2010 Previous INR Goal Range:  2-3 on  01/18/2009 Previous INR:  1.5 on  05/04/2010 Previous Coumadin Dose(mg):  10mg  2 days and 7.5 all other days on  04/13/2010 Previous Regimen:  see below on  05/04/2010  NEW REGIMEN & LAB RESULTS Anticoag. Dx: Atrial fibrillation Current INR: 1.9 Current Coumadin Dose(mg): vaired Regimen: varies--SEE BELOW  Provider: HOPPER Repeat testing in: 3 weeks Other Comments: The pt has 5mg  tabs at home and did the 10mg , then 7.5mg  and then resumed 5mg  as she was told.  Per MD the patient will begin: 5mg  daily except for 7.5mg  on Tues and Sat. She is aware that if it is ok in 3 weeks we will change to once [employer] CMA  May 11, 2010 10:27 AM   Dose has been reviewed with patient or caretaker during this visit. Reviewed by: Lucious Groves

## 2010-07-04 NOTE — Assessment & Plan Note (Signed)
Summary: PTCHECK//PH   Nurse Visit   Vital Signs:  Patient profile:   77 year old female Weight:      186.6 pounds Pulse rate:   60 / minute BP sitting:   132 / 80  (left arm) Cuff size:   large  Vitals Entered By: Shonna Chock (August 08, 2009 4:03 PM)  Impression & Recommendations:  Problem # 1:  CONSTIPATION (ICD-564.00)  Problem # 2:  COUMADIN THERAPY (ICD-V58.61)  Orders: Est. Patient Level I (83151) Protime (76160VP)  Complete Medication List: 1)  Synthroid 50 Mcg Tabs (Levothyroxine sodium) .... Take 1 tablet daily except take 1 1/2 tablets on tues,th, & sat. 2)  Advair Hfa 115-21 Mcg/act Aero (Fluticasone-salmeterol) .... Use 1-2 puffs daily 3)  Hydrochlorothiazide 25 Mg Tabs (Hydrochlorothiazide) .... Hold 4)  Advil 200 Mg Tabs (Ibuprofen) .... As needed 5)  Warfarin Sodium 5 Mg Tabs (Warfarin sodium) .... Use as directed by anticoagulation clinic. 6)  Cardizem 120 Mg Tabs (Diltiazem hcl) .... Take one tablet daily 7)  Advair Diskus 100-50 Mcg/dose Aepb (Fluticasone-salmeterol) .Marland Kitchen.. 1 inhalation every 12 hours   Patient Instructions: 1)  as per written instructions  CC: PT Check Comments REVIEWED MED LIST, PATIENT AGREED DOSE AND INSTRUCTION CORRECT    Allergies: No Known Drug Allergies Laboratory Results   Blood Tests      INR: 2.5   (Normal Range: 0.88-1.12   Therap INR: 2.0-3.5)    Orders Added: 1)  Est. Patient Level I [71062] 2)  Protime [[id number]]    ANTICOAGULATION RECORD PREVIOUS REGIMEN & LAB RESULTS Anticoagulation Diagnosis:  Atrial fibrillation on  01/18/2009 Previous INR Goal Range:  2-3 on  01/18/2009 Previous INR:  1.54 on  07/15/2009 Previous Coumadin Dose(mg):  5MG  DAILY, EXCEPT 7.5MG  ON M/F on  03/03/2009   NEW REGIMEN & LAB RESULTS Current INR: 2.5 Current Coumadin Dose(mg): 5mg  daily except 7.5mg  on Wed Regimen: 5mg  daily  Provider: Korey Arroyo      Repeat testing in: 2 weeks MEDICATIONS SYNTHROID 50 MCG  TABS  (LEVOTHYROXINE SODIUM) Take 1 tablet daily except take 1 1/2 tablets on Tues,Th, & Sat. ADVAIR HFA 115-21 MCG/ACT  AERO (FLUTICASONE-SALMETEROL) Use 1-2 puffs daily HYDROCHLOROTHIAZIDE 25 MG  TABS (HYDROCHLOROTHIAZIDE) HOLD ADVIL 200 MG TABS (IBUPROFEN) as needed WARFARIN SODIUM 5 MG TABS (WARFARIN SODIUM) Use as directed by anticoagulation clinic. CARDIZEM 120 MG TABS (DILTIAZEM HCL) take one tablet daily ADVAIR DISKUS 100-50 MCG/DOSE AEPB (FLUTICASONE-SALMETEROL) 1 inhalation every 12 hours   Anticoagulation Visit Questionnaire      Coumadin dose missed/changed:  No      Abnormal Bleeding Symptoms:  No   Any diet changes including alcohol intake, vegetables or greens since the last visit:  No Any illnesses or hospitalizations since the last visit:  No Any signs of clotting since the last visit (including chest discomfort, dizziness, shortness of breath, arm tingling, slurred speech, swelling or redness in leg):  No

## 2010-07-04 NOTE — Letter (Signed)
Summary: Handicapped Placard/[facility]  Handicapped Placard/[facility]   Imported By: Lanelle Bal 09/27/2009 13:09:38  _____________________________________________________________________  External Attachment:    Type:   Image     Comment:   External Document

## 2010-07-04 NOTE — Assessment & Plan Note (Signed)
Summary: pt check///sph   Nurse Visit   Vital Signs:  Patient profile:   77 year old female Height:      66.5 inches (168.91 cm) Weight:      184 pounds (83.64 kg) BMI:     29.36 Temp:     97.7 degrees F (36.50 degrees C) oral BP sitting:   120 / 60  (left arm) Cuff size:   regular  Vitals Entered By: Lucious Groves CMA (May 04, 2010 9:43 AM) CC: PT check./kb   Allergies: 1)  ! Amoxicillin Laboratory Results   Blood Tests      INR: 1.5   (Normal Range: 0.88-1.12   Therap INR: 2.0-3.5)    Orders Added: 1)  Est. Patient Level I [37106] 2)  Protime [[id number]]   ANTICOAGULATION RECORD PREVIOUS REGIMEN & LAB RESULTS Anticoagulation Diagnosis:  Atrial fibrillation on  04/13/2010 Previous INR Goal Range:  2-3 on  01/18/2009 Previous INR:  2.7 on  04/13/2010 Previous Coumadin Dose(mg):  10mg  2 days and 7.5 all other days on  04/13/2010 Previous Regimen:  5mg  qd except 7.5mg  on Wed. on  04/13/2010  NEW REGIMEN & LAB RESULTS Anticoag. Dx: Atrial fibrillation Current INR: 1.5 Regimen: see below  Provider: Alwyn Ren Repeat testing in: 1 week Other Comments: Patient notes that she has been taking 5mg  once daily, but 7.5mg  on Wednesdays. She has missed 2 doses due to running out of the med. Per MD she was advised to take 10mg  today, 7.5mg  tomorrow, and 5mg  once daily thereafter. Patient expressed understanding and per MD we will re-check in 1 week. Lucious Groves CMA  May 04, 2010 9:45 AM   Dose has been reviewed with patient or caretaker during this visit. Reviewed by: Lucious Groves  Anticoagulation Visit Questionnaire Coumadin dose missed/changed:  Yes Coumadin Dose Comments:  ran out out med Abnormal Bleeding Symptoms:  No  Any diet changes including alcohol intake, vegetables or greens since the last visit:  No Any illnesses or hospitalizations since the last visit:  No Any signs of clotting since the last visit (including chest discomfort, dizziness, shortness of  breath, arm tingling, slurred speech, swelling or redness in leg):  No  MEDICATIONS SYNTHROID 50 MCG  TABS (LEVOTHYROXINE SODIUM) Take 1 tablet daily except take 1 1/2 tablets on Tues,Th, & Sat. **APPOINTMENT DUE** ADVAIR HFA 115-21 MCG/ACT  AERO (FLUTICASONE-SALMETEROL) Use 1-2 puffs daily HYDROCHLOROTHIAZIDE 25 MG  TABS (HYDROCHLOROTHIAZIDE) Hold ADVIL 200 MG TABS (IBUPROFEN) as needed WARFARIN SODIUM 5 MG TABS (WARFARIN SODIUM) Use as directed by anticoagulation clinic. CARDIZEM 120 MG TABS (DILTIAZEM HCL) take one tablet daily ADVAIR DISKUS 100-50 MCG/DOSE AEPB (FLUTICASONE-SALMETEROL) 1 inhalation every 12 hours SYMBICORT 160-4.5 MCG/ACT AERO (BUDESONIDE-FORMOTEROL FUMARATE) 1-2 puffs every 12 hrs as needed; GARGLE  & SPIT AFTER USE PROAIR HFA 108 (90 BASE) MCG/ACT AERS (ALBUTEROL SULFATE) 1-2 puffs every 4 hrs as needed METRONIDAZOLE 500 MG TABS (METRONIDAZOLE) take 1 tab three times a day *avoid alcohol* PROMETHAZINE VC/CODEINE 6.25-5-10 MG/5ML SYRP (PHENYLEPH-PROMETHAZINE-COD) 1 tsp every 6 hrs as needed cough

## 2010-07-04 NOTE — Progress Notes (Signed)
Summary: Refill Request  Phone Note Call from Patient Call back at Home Phone [phone]   Caller: Patient Summary of Call: Pt had her coumdin check and need some more call in. [facility] [address] Initial call taken by: Freddy Jaksch,  July 15, 2009 3:32 PM    Prescriptions: WARFARIN SODIUM 5 MG TABS (WARFARIN SODIUM) Use as directed by anticoagulation clinic.  #30 Each x 2   Entered by:   Shonna Chock   Authorized by:   Marga Melnick MD   Signed by:   Shonna Chock on 07/15/2009   Method used:   Electronically to        [facility]  [phone]* (retail)       [address]       [place], [place]  [address]       Ph: [phone] or [phone]       Fax: [phone]   RxID:   [id number]

## 2010-07-06 ENCOUNTER — Telehealth (INDEPENDENT_AMBULATORY_CARE_PROVIDER_SITE_OTHER): Payer: Self-pay | Admitting: *Deleted

## 2010-07-06 NOTE — Assessment & Plan Note (Signed)
Summary: yearly exam///sph   Vital Signs:  Patient profile:   77 year old female Height:      67.75 inches Weight:      182.4 pounds BMI:     28.04 Temp:     97.5 degrees F oral Pulse rate:   64 / minute Resp:     16 per minute BP sitting:   134 / 80  (left arm) Cuff size:   large  Vitals Entered By: Shonna Chock CMA (June 16, 2010 1:41 PM) CC: Yearly exam , COPD follow-up  Vision Screening:Left eye with correction: 20 / 40 Right eye with correction: 20 / 100 Both eyes with correction: 20 / 30        Vision Entered By: Shonna Chock CMA (June 16, 2010 1:55 PM)   Primary Care Provider:  Marga Melnick, MD   CC:  Yearly exam  and COPD follow-up.  History of Present Illness: Here for Medicare AWV: 1.Risk factors based on Past M, S, F history:see Diagnoses; chart updated 2.Physical Activities: no program 3.Depression/mood:no issues  4.Hearing: whisper heard @ 6 ft 5.ADL's:no limitations  6.Fall Risk: denied 7.Home Safety:safety proofed  8.Height, weight, &visual acuity: 9.Counseling:POA & Living Will ? need  updated 10.Labs ordered based on risk factors: see Orders 11. Referral Coordination: none requested, but Mammograms & BMD recommended. Colonoscopy 2010. Dental care F/U  indicated 12. Care Plan:see Instructions 13.  Cognitive Assessment: Oriented X 3;  memory & recall  excelent  ; "WORLD" spelled  backwards; mood & affect  normal.      Chronic Lung Disease: She  has pulmonary fibrosis.  The patient reports wheezing and exercise induced symptoms, but denies shortness of breath, chest tightness, cough, increased sputum, heat intolerance, and nocturnal awakening.  Medication use includes quick relief med  which is used rarely and controller med intermittently. She questioned whether  Advair  aggravated AF. Respiratoy symptoms  are worse with cold.  Flu shot up to date.  Preventive Screening-Counseling & Management  Alcohol-Tobacco     Alcohol drinks/day: 1-2   Smoking Status: quit > 6 months     Packs/Day: 0.75     Year Started: 1953     Year Quit: 1987  Caffeine-Diet-Exercise     Caffeine use/day: 2-3 cups  Hep-HIV-STD-Contraception     Dental Visit-last 6 months no     Dental Care Counseling: due     Sun Exposure-Excessive: no  Safety-Violence-Falls     Seat Belt Use: yes     Smoke Detectors: yes      Blood Transfusions:  no.        Travel History:  [place] in [place].    Current Medications (verified): 1)  Synthroid 50 Mcg  Tabs (Levothyroxine Sodium) .... Take 1 Tablet Daily Except Take 1 1/2 Tablets On Tues,th, & Sat. **appointment Due** 2)  Advair Hfa 115-21 Mcg/act  Aero (Fluticasone-Salmeterol) .... Use 1-2 Puffs Daily 3)  Hydrochlorothiazide 25 Mg  Tabs (Hydrochlorothiazide) .... Hold 4)  Advil 200 Mg Tabs (Ibuprofen) .... As Needed 5)  Warfarin Sodium 5 Mg Tabs (Warfarin Sodium) .... Use As Directed By Anticoagulation Clinic. 6)  Cardizem 120 Mg Tabs (Diltiazem Hcl) .... Take One Tablet Daily 7)  Advair Diskus 100-50 Mcg/dose Aepb (Fluticasone-Salmeterol) .Marland Kitchen.. 1 Inhalation Every 12 Hours 8)  Symbicort 160-4.5 Mcg/act Aero (Budesonide-Formoterol Fumarate) .Marland Kitchen.. 1-2 Puffs Every 12 Hrs As Needed; Gargle  & Spit After Use 9)  Promethazine Vc/codeine 6.25-5-10 Mg/41ml Syrp (Phenyleph-Promethazine-Cod) .Marland Kitchen.. 1 Tsp Every 6 Hrs As  Needed Cough  Allergies: 1)  ! Amoxicillin  Past History:  Past Medical History: Hypothyroidism degenerative joint disease pulmonary fibrosis, idiopathic vs chronic ERD rheumatoid arthritis, Dr Kellie Simmering OSA , CPAP intolerant Hyperlipidemia: NMR Lipoprofile 2005: LDL 144(1570/732), HDL 61,TG 103. LDL goal = < 130. recurrent atrial fibrillation  Past Surgical History: Hysterectomy for abnl. pap smear; ; ovaries remain 1987 Cholecystectomy ? with Appendectomy  1966 G1 P1 Cystoscopy for hematuria : cystitis 1974; Retinal detachment OD  Family History: Father: CAD, TIA's, died @ 72  post   fall Mother:   Asthma, died @ 19 Siblings: none No FH of Colon Cancer:  Social History: Former Smoker: quit 1987 Married Alcohol use-yes: socially Regular exercise-no Occupation:Sales:Retired  Caffeine use/day:  2-3 cups Dental Care w/in 6 mos.:  no Sun Exposure-Excessive:  no Seat Belt Use:  yes Blood Transfusions:  no Smoking Status:  quit > 6 months Packs/Day:  0.75  Review of Systems       The patient complains of peripheral edema.  The patient denies anorexia, fever, weight loss, weight gain, hoarseness, syncope, headaches, hemoptysis, abdominal pain, melena, hematochezia, severe indigestion/heartburn, hematuria, incontinence, suspicious skin lesions, depression, unusual weight change, enlarged lymph nodes, and angioedema.         Cataracts ? progressing MS:  Complains of joint pain; denies joint redness and joint swelling; hands & R  hip pain mainly; taking 3-5 Advil daily.  Physical Exam  General:  well-nourished,in no acute distress; alert,appropriate and cooperative throughout examination Head:  Normocephalic and atraumatic without obvious abnormalities.  Eyes:  No corneal or conjunctival inflammation noted. Perrla. Funduscopic exam benign, without hemorrhages, exudates or papilledema. No cataracts noted Ears:  External ear exam shows no significant lesions or deformities.  Otoscopic examination reveals clear canals, tympanic membranes are intact bilaterally without bulging, retraction, inflammation or discharge. Hearing is grossly normal bilaterally. Nose:  External nasal examination shows no deformity or inflammation. Nasal mucosa are pink and moist without lesions or exudates. Mouth:  Oral mucosa and oropharynx without lesions or exudates.  Teeth in good repair. Neck:  No deformities, masses, or tenderness noted. Breasts:  Mammograms recommended Lungs:  Scattered musical rhonchi & scattered rales, crackles Heart:  bradycardia.   Split S1 Flow murmur Abdomen:  Bowel sounds  positive,abdomen soft and non-tender without masses, organomegaly or hernias noted. Rectal:  Colonoscopy 2010 Msk:  No deformity or scoliosis noted of thoracic or lumbar spine.   Pulses:  R and L carotid,radial,dorsalis pedis and posterior tibial pulses are full and equal bilaterally Extremities:  trace left pedal edema and trace right pedal edema. DIP  feformities   Neurologic:  alert & oriented X3 and DTRs symmetrical  but 0 @ knees Skin:  Intact without suspicious lesions or rashes Cervical Nodes:  No lymphadenopathy noted Axillary Nodes:  No palpable lymphadenopathy Psych:  memory intact for recent and remote, normally interactive, and good eye contact.     Impression & Recommendations:  Problem # 1:  PREVENTIVE HEALTH CARE (ICD-V70.0)  Orders: Medicare -1st Annual Wellness Visit [phone])  Problem # 2:  OSTEOARTHRITIS (ICD-715.90)  Her updated medication list for this problem includes:    Advil 200 Mg Tabs (Ibuprofen) .Marland Kitchen... As needed    Tramadol Hcl 50 Mg Tabs (Tramadol hcl) .Marland Kitchen... 1 every 6 hrs as needed for joint pain in place of advil  Problem # 3:  ATRIAL FIBRILLATION (ICD-427.31) PAF, clinically normal rhythm today Her updated medication list for this problem includes:    Warfarin Sodium 5 Mg  Tabs (Warfarin sodium) ..... Use as directed by anticoagulation clinic.    Cardizem 120 Mg Tabs (Diltiazem hcl) .Marland Kitchen... Take one tablet daily  Orders: Protime (84696EX) EKG w/ Interpretation (93000)  Problem # 4:  COUMADIN THERAPY (ICD-V58.61) using NSAIDS Orders: Protime (52841LK)  Problem # 5:  PULMONARY FIBROSIS (ICD-515)  Complete Medication List: 1)  Synthroid 50 Mcg Tabs (Levothyroxine sodium) .... Take 1 tablet daily except take 1 1/2 tablets on tues,th, & sat. **appointment due** 2)  Advair Hfa 115-21 Mcg/act Aero (Fluticasone-salmeterol) .... Use 1-2 puffs daily 3)  Hydrochlorothiazide 25 Mg Tabs (Hydrochlorothiazide) .... Hold 4)  Advil 200 Mg Tabs (Ibuprofen) .... As  needed 5)  Warfarin Sodium 5 Mg Tabs (Warfarin sodium) .... Use as directed by anticoagulation clinic. 6)  Cardizem 120 Mg Tabs (Diltiazem hcl) .... Take one tablet daily 7)  Advair Diskus 100-50 Mcg/dose Aepb (Fluticasone-salmeterol) .Marland Kitchen.. 1 inhalation every 12 hours 8)  Symbicort 160-4.5 Mcg/act Aero (Budesonide-formoterol fumarate) .Marland Kitchen.. 1-2 puffs every 12 hrs as needed; gargle  & spit after use 9)  Promethazine Vc/codeine 6.25-5-10 Mg/3ml Syrp (Phenyleph-promethazine-cod) .Marland Kitchen.. 1 tsp every 6 hrs as needed cough 10)  Tramadol Hcl 50 Mg Tabs (Tramadol hcl) .Marland Kitchen.. 1 every 6 hrs as needed for joint pain in place of advil  Other Orders: Pneumococcal Vaccine (44010) Admin 1st Vaccine (27253)  Patient Instructions: 1)  Advil is CONTRAINDICATED with Coumadin. Check the PT/INR in 2 weeks because of change to Tramadol. Update your POA & Living Will. 2)  See your eye doctor yearly to check for  eye damage. 3)  Schedule your mammograms, Bone density & dental care follow-up. Prescriptions: TRAMADOL HCL 50 MG TABS (TRAMADOL HCL) 1 every 6 hrs as needed for joint pain in place of Advil  #30 x 5   Entered and Authorized by:   Marga Melnick MD   Signed by:   Marga Melnick MD on 06/16/2010   Method used:   Electronically to        [facility]  [phone]* (retail)       [address]       [place], [place]  [address]       Ph: [phone] or [phone]       Fax: [phone]   RxID:   [id number]    Orders Added: 1)  Protime [[id number]] 2)  Pneumococcal Vaccine [90732] 3)  Admin 1st Vaccine [90471] 4)  Est. Patient Level III [62831] 5)  Medicare -1st Annual Wellness Visit [G0438] 6)  EKG w/ Interpretation [93000]   Immunizations Administered:  Pneumonia Vaccine:    Vaccine Type: Pneumovax (Medicare)    Site: right deltoid    Mfr: Merck    Dose: 0.5 ml    Route: IM    Given by: Shonna Chock CMA    Exp. Date: 10/04/2011    Lot #:  [id number]   Immunizations Administered:  Pneumonia Vaccine:    Vaccine Type: Pneumovax (Medicare)    Site: right deltoid    Mfr: Merck    Dose: 0.5 ml    Route: IM    Given by: Shonna Chock CMA    Exp. Date: 10/04/2011    Lot #: [id number]     ANTICOAGULATION RECORD PREVIOUS REGIMEN & LAB RESULTS Anticoagulation Diagnosis:  Atrial fibrillation on  05/26/2010 Previous INR Goal Range:  2-3 on  01/18/2009 Previous INR:  1.4 on  05/26/2010 Previous Coumadin Dose(mg):  5mg  on  05/26/2010 Previous Regimen:  see below on  05/26/2010  NEW REGIMEN & LAB RESULTS Current INR: 2.7 Current Coumadin Dose(mg): 5MG  daily except 7.5MG  on Wed Regimen: see below  (no change)  Provider: Kiptyn Rafuse MEDICATIONS SYNTHROID 50 MCG  TABS (LEVOTHYROXINE SODIUM) Take 1 tablet daily except take 1 1/2 tablets on Tues,Th, & Sat. **APPOINTMENT DUE** ADVAIR HFA 115-21 MCG/ACT  AERO (FLUTICASONE-SALMETEROL) Use 1-2 puffs daily HYDROCHLOROTHIAZIDE 25 MG  TABS (HYDROCHLOROTHIAZIDE) Hold ADVIL 200 MG TABS (IBUPROFEN) as needed WARFARIN SODIUM 5 MG TABS (WARFARIN SODIUM) Use as directed by anticoagulation clinic. CARDIZEM 120 MG TABS (DILTIAZEM HCL) take one tablet daily ADVAIR DISKUS 100-50 MCG/DOSE AEPB (FLUTICASONE-SALMETEROL) 1 inhalation every 12 hours SYMBICORT 160-4.5 MCG/ACT AERO (BUDESONIDE-FORMOTEROL FUMARATE) 1-2 puffs every 12 hrs as needed; GARGLE  & SPIT AFTER USE PROMETHAZINE VC/CODEINE 6.25-5-10 MG/5ML SYRP (PHENYLEPH-PROMETHAZINE-COD) 1 tsp every 6 hrs as needed cough TRAMADOL HCL 50 MG TABS (TRAMADOL HCL) 1 every 6 hrs as needed for joint pain in place of Advil   Anticoagulation Visit Questionnaire      Coumadin dose missed/changed:  No      Abnormal Bleeding Symptoms:  No   Any diet changes including alcohol intake, vegetables or greens since the last visit:  Yes      Diet Comments:patient states she had a little bit of greens on New Year's day Any illnesses or hospitalizations since  the last visit:  No Any signs of clotting since the last visit (including chest discomfort, dizziness, shortness of breath, arm tingling, slurred speech, swelling or redness in leg):  No   Appended Document: yearly exam///sph I reviewed her chart in detail. In 2 weeks have PT/INR performed with fasting labs (these are overdue): BMET, Lipids, Hepatic panel;CBC & dif, TSH ( Codes: 427.31, 995.20,401.9,244.9). Also BMD , mammograms & followup Dental care indicated. Call if referrals needed  Appended Document: yearly exam///sph mailed.

## 2010-07-06 NOTE — Medication Information (Signed)
Summary: [facility]  Anticoagulant Therapy  Managed by: Inactive PCP: Marga Melnick, MD  Supervising MD: Eden Emms MD, Theron Arista Indication 1: Atrial fibrillation Lab Used: [facility] [place] Site: [address] INR RANGE 2-3          Comments: Followed by Dr Alwyn Ren  Allergies: 1)  ! Amoxicillin  Anticoagulation Management History:      Positive risk factors for bleeding include an age of 14 years or older and history of GI bleeding.  The bleeding index is 'intermediate risk'.  Positive CHADS2 values include Age > 89 years old.  The start date was 07/18/2007.  Her last INR was 2.7.  Anticoagulation responsible provider: Eden Emms MD, Theron Arista.    Anticoagulation Management Assessment/Plan:      The patient's current anticoagulation dose is Warfarin sodium 5 mg tabs: Use as directed by anticoagulation clinic..  The target INR is 2 - 3.  The next INR is due 3 .  Anticoagulation instructions were given to patient.  Results were reviewed/authorized by Inactive.         Prior Anticoagulation Instructions: INR 1.7 Take 1.5 tablets today and tomorrow, then resume 1.5 tablets on each Monday and Friday and 1 tablet on all other days.

## 2010-07-06 NOTE — Assessment & Plan Note (Signed)
Summary: HOP PT/Possible URI--COUGH AND MUCOUS PRODUCTION/KB   Vital Signs:  Patient profile:   77 year old female Weight:      182.4 pounds BMI:     29.10 O2 Sat:      93 % on Room air Temp:     97.8 degrees F oral Pulse rate:   57 / minute Resp:     17 per minute BP sitting:   122 / 70  (left arm) Cuff size:   large  Vitals Entered By: Shonna Chock CMA (May 23, 2010 8:17 AM)  O2 Flow:  Room air CC: URI and SOB, COPD follow-up, URI symptoms   Primary Care Provider:  Marga Melnick, MD   CC:  URI and SOB, COPD follow-up, and URI symptoms.  History of Present Illness:      This is a 77 year old woman who presents for  acute RTI symptoms since 12/15 as dry cough.  The patient now  reports shortness of breath, chest tightness, wheezing, increased sputum as white frothy secretions, and exercise induced symptoms, but denies nocturnal awakening.  The patient reports limitation of most activities.   She has a MDI with spacer, Proair , but has not used it @ all.  Medication use includes controller med daily, Symbicort ONE puff once daily.The patient reports nasal congestion, sore throat, and sick contacts(husband ill first), but denies purulent nasal discharge and earache.  The patient denies fever.  The patient denies headache.  Risk factors for Strep sinusitis include bilateral facial pain and tooth pain.  The patient denies the following risk factors for Strep sinusitis: tender adenopathy. She has had Flu shot. Pneumovax ? in 2006. PMH of Pulmonary Fibrosis.  Current Medications (verified): 1)  Synthroid 50 Mcg  Tabs (Levothyroxine Sodium) .... Take 1 Tablet Daily Except Take 1 1/2 Tablets On Tues,th, & Sat. **appointment Due** 2)  Advair Hfa 115-21 Mcg/act  Aero (Fluticasone-Salmeterol) .... Use 1-2 Puffs Daily 3)  Hydrochlorothiazide 25 Mg  Tabs (Hydrochlorothiazide) .... Hold 4)  Advil 200 Mg Tabs (Ibuprofen) .... As Needed 5)  Warfarin Sodium 5 Mg Tabs (Warfarin Sodium) .... Use  As Directed By Anticoagulation Clinic. 6)  Cardizem 120 Mg Tabs (Diltiazem Hcl) .... Take One Tablet Daily 7)  Advair Diskus 100-50 Mcg/dose Aepb (Fluticasone-Salmeterol) .Marland Kitchen.. 1 Inhalation Every 12 Hours 8)  Symbicort 160-4.5 Mcg/act Aero (Budesonide-Formoterol Fumarate) .Marland Kitchen.. 1-2 Puffs Every 12 Hrs As Needed; Gargle  & Spit After Use 9)  Promethazine Vc/codeine 6.25-5-10 Mg/23ml Syrp (Phenyleph-Promethazine-Cod) .Marland Kitchen.. 1 Tsp Every 6 Hrs As Needed Cough  Allergies: 1)  ! Amoxicillin  Physical Exam  General:  in no acute distress; alert,appropriate and cooperative throughout examination Eyes:  No corneal or conjunctival inflammation noted. EOMI. Vision grossly normal. Ears:  External ear exam shows no significant lesions or deformities.  Otoscopic examination reveals clear canals, tympanic membranes are intact bilaterally without bulging, retraction, inflammation or discharge. Hearing is grossly normal bilaterally. Nose:  External nasal examination shows no deformity or inflammation. Nasal mucosa are pink and moist without lesions or exudates. Mouth:  Oral mucosa and oropharynx without lesions or exudates.  Teeth in good repair. Mild pharyngeal erythema.   Lungs:  Normal respiratory effort, chest expands symmetrically. Lungs : diffuse crackles / rales homogenously Heart:  regular rhythm, no murmur, no gallop, no rub, and bradycardia.   Extremities:  No clubbing, cyanosis, edema. Skin:  Intact without suspicious lesions or rashes Cervical Nodes:  No lymphadenopathy noted Axillary Nodes:  No palpable lymphadenopathy  Impression & Recommendations:  Problem # 1:  BRONCHITIS-ACUTE (ICD-466.0)  The following medications were removed from the medication list:    Proair Hfa 108 (90 Base) Mcg/act Aers (Albuterol sulfate) .Marland Kitchen... 1-2 puffs every 4 hrs as needed    Metronidazole 500 Mg Tabs (Metronidazole) .Marland Kitchen... Take 1 tab three times a day *avoid alcohol* Her updated medication list for this problem  includes:    Advair Hfa 115-21 Mcg/act Aero (Fluticasone-salmeterol) ..... Use 1-2 puffs daily    Advair Diskus 100-50 Mcg/dose Aepb (Fluticasone-salmeterol) .Marland Kitchen... 1 inhalation every 12 hours    Symbicort 160-4.5 Mcg/act Aero (Budesonide-formoterol fumarate) .Marland Kitchen... 1-2 puffs every 12 hrs as needed; gargle  & spit after use    Promethazine Vc/codeine 6.25-5-10 Mg/19ml Syrp (Phenyleph-promethazine-cod) .Marland Kitchen... 1 tsp every 6 hrs as needed cough    Azithromycin 250 Mg Tabs (Azithromycin) .Marland Kitchen... As per pack  Orders: Prescription Created Electronically [phone])  Problem # 2:  URI (ICD-465.9)  Her updated medication list for this problem includes:    Advil 200 Mg Tabs (Ibuprofen) .Marland Kitchen... As needed    Promethazine Vc/codeine 6.25-5-10 Mg/37ml Syrp (Phenyleph-promethazine-cod) .Marland Kitchen... 1 tsp every 6 hrs as needed cough  Problem # 3:  PULMONARY FIBROSIS (ICD-515)  Problem # 4:  ATRIAL FIBRILLATION (ICD-427.31)  clinically absent  Her updated medication list for this problem includes:    Warfarin Sodium 5 Mg Tabs (Warfarin sodium) ..... Use as directed by anticoagulation clinic.    Cardizem 120 Mg Tabs (Diltiazem hcl) .Marland Kitchen... Take one tablet daily  Orders: Protime (60454UJ)  Problem # 5:  LONG-TERM (CURRENT) USE OF ANTICOAGULANTS (ICD-V58.61)  Orders: Prescription Created Electronically [phone]) Protime ([id number])  Complete Medication List: 1)  Synthroid 50 Mcg Tabs (Levothyroxine sodium) .... Take 1 tablet daily except take 1 1/2 tablets on tues,th, & sat. **appointment due** 2)  Advair Hfa 115-21 Mcg/act Aero (Fluticasone-salmeterol) .... Use 1-2 puffs daily 3)  Hydrochlorothiazide 25 Mg Tabs (Hydrochlorothiazide) .... Hold 4)  Advil 200 Mg Tabs (Ibuprofen) .... As needed 5)  Warfarin Sodium 5 Mg Tabs (Warfarin sodium) .... Use as directed by anticoagulation clinic. 6)  Cardizem 120 Mg Tabs (Diltiazem hcl) .... Take one tablet daily 7)  Advair Diskus 100-50 Mcg/dose Aepb (Fluticasone-salmeterol) .Marland Kitchen..  1 inhalation every 12 hours 8)  Symbicort 160-4.5 Mcg/act Aero (Budesonide-formoterol fumarate) .Marland Kitchen.. 1-2 puffs every 12 hrs as needed; gargle  & spit after use 9)  Promethazine Vc/codeine 6.25-5-10 Mg/79ml Syrp (Phenyleph-promethazine-cod) .Marland Kitchen.. 1 tsp every 6 hrs as needed cough 10)  Azithromycin 250 Mg Tabs (Azithromycin) .... As per pack  Patient Instructions: 1)  ProAir every 4-6 hrs as needed for wheezing or SOB as discussed. If ProAir is needed , use Symbicort 1-2 puffs every 12 hrs as needed; gargle & spit after use.Advair can be substituted for Symbicort if very stable ( not requiring ProAir ) as maintenance agent. Do not use Advair & Symbicor simultaneously.  PT/INR must be checked @ least every 30 days. 2)     Warfarin Instructions: None today, 2.5 Wed/Thursday and recheck Friday 05/26/2010 Prescriptions: WARFARIN SODIUM 5 MG TABS (WARFARIN SODIUM) Use as directed by anticoagulation clinic.  #60 x 0   Entered and Authorized by:   Megan Melnick MD   Signed by:   Megan Melnick MD on 05/23/2010   Method used:   Faxed to ...       [facility]  [phone]* (retail)       [phone] [facility] [address]       [place]  [place], [place]  [address]       Ph: [phone] or [phone]       Fax: [phone]   RxID:   [id number] AZITHROMYCIN 250 MG TABS (AZITHROMYCIN) as per pack  #1 x 0   Entered and Authorized by:   Megan Melnick MD   Signed by:   Megan Melnick MD on 05/23/2010   Method used:   Faxed to ...       [facility] [address]  [phone]* (retail)       [address]       [place], [place]  [address]       Ph: [phone] or [phone]       Fax: [phone]   RxID:   [id number]    Orders Added: 1)  Est. Patient Level IV [31517] 2)  Prescription Created Electronically [[id number]] 3)  Protime [[id number]]     ANTICOAGULATION RECORD PREVIOUS REGIMEN & LAB RESULTS Anticoagulation Diagnosis:  Atrial fibrillation on   05/11/2010 Previous INR Goal Range:  2-3 on  01/18/2009 Previous INR:  1.9 on  05/11/2010 Previous Coumadin Dose(mg):  vaired on  05/11/2010 Previous Regimen:  varies--SEE BELOW on  05/11/2010  NEW REGIMEN & LAB RESULTS Current INR: 3.3 Current Coumadin Dose(mg): 7.5mg  Tue/Sat, 5mg  all other days Regimen: Hold today, 2.5mg  Wed/Thurs  Provider: Hopper,William      Repeat testing in: Friday 05/26/2010 MEDICATIONS SYNTHROID 50 MCG  TABS (LEVOTHYROXINE SODIUM) Take 1 tablet daily except take 1 1/2 tablets on Tues,Th, & Sat. **APPOINTMENT DUE** ADVAIR HFA 115-21 MCG/ACT  AERO (FLUTICASONE-SALMETEROL) Use 1-2 puffs daily HYDROCHLOROTHIAZIDE 25 MG  TABS (HYDROCHLOROTHIAZIDE) Hold ADVIL 200 MG TABS (IBUPROFEN) as needed WARFARIN SODIUM 5 MG TABS (WARFARIN SODIUM) Use as directed by anticoagulation clinic. CARDIZEM 120 MG TABS (DILTIAZEM HCL) take one tablet daily ADVAIR DISKUS 100-50 MCG/DOSE AEPB (FLUTICASONE-SALMETEROL) 1 inhalation every 12 hours SYMBICORT 160-4.5 MCG/ACT AERO (BUDESONIDE-FORMOTEROL FUMARATE) 1-2 puffs every 12 hrs as needed; GARGLE  & SPIT AFTER USE PROMETHAZINE VC/CODEINE 6.25-5-10 MG/5ML SYRP (PHENYLEPH-PROMETHAZINE-COD) 1 tsp every 6 hrs as needed cough AZITHROMYCIN 250 MG TABS (AZITHROMYCIN) as per pack

## 2010-07-06 NOTE — Assessment & Plan Note (Signed)
Summary: pt check///sph   Nurse Visit   Vital Signs:  Patient profile:   77 year old female Height:      66.5 inches (168.91 cm) Weight:      184.38 pounds (83.81 kg) BMI:     29.42 Temp:     97.4 degrees F (36.33 degrees C) oral BP sitting:   110 / 50  (left arm) Cuff size:   large  Vitals Entered By: Lucious Groves CMA (May 26, 2010 9:49 AM)  Allergies: 1)  ! Amoxicillin Laboratory Results   Blood Tests      INR: 1.4   (Normal Range: 0.88-1.12   Therap INR: 2.0-3.5)    Orders Added: 1)  Est. Patient Level I [16109] 2)  Protime [[id number]]   ANTICOAGULATION RECORD PREVIOUS REGIMEN & LAB RESULTS Anticoagulation Diagnosis:  Atrial fibrillation on  05/11/2010 Previous INR Goal Range:  2-3 on  01/18/2009 Previous INR:  3.3 on  05/23/2010 Previous Coumadin Dose(mg):  7.5mg  Tue/Sat, 5mg  all other days on  05/23/2010 Previous Regimen:  Hold today, 2.5mg  Wed/Thurs on  05/23/2010  NEW REGIMEN & LAB RESULTS Anticoag. Dx: Atrial fibrillation Current INR: 1.4 Current Coumadin Dose(mg): 5mg  Regimen: see below  Provider: Alwyn Ren  Repeat testing in: 3  Other Comments: Patient notes that she has 5mg  tabs at home and she took nothing on 12/20, 1/2 tab on that 21st and 22nd as she was told. Per MD patient will do 7.5mg  today and resume her previous schedule. Lucious Groves CMA  May 26, 2010 9:35 AM   Dose has been reviewed with patient or caretaker during this visit. Reviewed by: Lucious Groves  Anticoagulation Visit Questionnaire Coumadin dose missed/changed:  No Abnormal Bleeding Symptoms:  No  Any diet changes including alcohol intake, vegetables or greens since the last visit:  No Any illnesses or hospitalizations since the last visit:  No Any signs of clotting since the last visit (including chest discomfort, dizziness, shortness of breath, arm tingling, slurred speech, swelling or redness in leg):  No  MEDICATIONS SYNTHROID 50 MCG  TABS (LEVOTHYROXINE SODIUM) Take  1 tablet daily except take 1 1/2 tablets on Tues,Th, & Sat. **APPOINTMENT DUE** ADVAIR HFA 115-21 MCG/ACT  AERO (FLUTICASONE-SALMETEROL) Use 1-2 puffs daily HYDROCHLOROTHIAZIDE 25 MG  TABS (HYDROCHLOROTHIAZIDE) Hold ADVIL 200 MG TABS (IBUPROFEN) as needed WARFARIN SODIUM 5 MG TABS (WARFARIN SODIUM) Use as directed by anticoagulation clinic. CARDIZEM 120 MG TABS (DILTIAZEM HCL) take one tablet daily ADVAIR DISKUS 100-50 MCG/DOSE AEPB (FLUTICASONE-SALMETEROL) 1 inhalation every 12 hours SYMBICORT 160-4.5 MCG/ACT AERO (BUDESONIDE-FORMOTEROL FUMARATE) 1-2 puffs every 12 hrs as needed; GARGLE  & SPIT AFTER USE PROMETHAZINE VC/CODEINE 6.25-5-10 MG/5ML SYRP (PHENYLEPH-PROMETHAZINE-COD) 1 tsp every 6 hrs as needed cough AZITHROMYCIN 250 MG TABS (AZITHROMYCIN) as per pack

## 2010-07-12 NOTE — Progress Notes (Signed)
Summary: refill  Phone Note Refill Request Message from:  Fax from Pharmacy on July 06, 2010 2:59 PM  Refills Requested: Medication #1:  SYNTHROID 50 MCG  TABS Take 1 tablet daily except take 1 1/2 tablets on Tues [facility] -- fax [phone]   Initial call taken by: Okey Regal [place],  July 06, 2010 3:02 PM  Follow-up for Phone Call        TSH is high normal; please INCREASE thyroid to 1&1/2 M,W, F & Sun; 1 once daily T,Th & Sat; recheck TSH in 3-4 months (244.90 Follow-up by: Marga Melnick MD,  July 06, 2010 3:51 PM    New/Updated Medications: SYNTHROID 50 MCG  TABS (LEVOTHYROXINE SODIUM) Take  1 1/2 tablets on M,W, F & Sun and 1 once daily Tues, Th & Sat; check TSH in 3-4 months Prescriptions: SYNTHROID 50 MCG  TABS (LEVOTHYROXINE SODIUM) Take  1 1/2 tablets on M,W, F & Sun and 1 once daily Tues, Th & Sat; check TSH in 3-4 months  #108 x 2   Entered by:   Shonna Chock CMA   Authorized by:   Marga Melnick MD   Signed by:   Shonna Chock CMA on 07/06/2010   Method used:   Faxed to ...       [facility] [address]  [phone]* (retail)       [address]       [place], [place]  [address]       Ph: [phone] or [phone]       Fax: [phone]   RxID:   [id number]

## 2010-07-12 NOTE — Assessment & Plan Note (Signed)
Summary: pt check; lab=bmet, lip, hep, cbcd, tsh 427.31, 995.20, 401.9.Marland KitchenMarland Kitchen   Nurse Visit   Vital Signs:  Patient profile:   77 year old female Height:      67.75 inches (172.09 cm) Weight:      179.25 pounds (81.48 kg) BMI:     27.56 Temp:     97.9 degrees F (36.61 degrees C) oral BP sitting:   120 / 60  (left arm) Cuff size:   large  Vitals Entered By: Lucious Groves CMA (July 04, 2010 9:45 AM) CC: PT check./kb   Allergies: 1)  ! Amoxicillin Laboratory Results   Blood Tests      INR: 2.3   (Normal Range: 0.88-1.12   Therap INR: 2.0-3.5)    Orders Added: 1)  Est. Patient Level I [62130] 2)  Protime [[id number]]   ANTICOAGULATION RECORD PREVIOUS REGIMEN & LAB RESULTS Anticoagulation Diagnosis:  Atrial fibrillation on  05/26/2010 Previous INR Goal Range:  2-3 on  01/18/2009 Previous INR:  2.7 on  06/16/2010 Previous Coumadin Dose(mg):  5MG  daily except 7.5MG  on Wed on  06/16/2010 Previous Regimen:  see below on  05/26/2010  NEW REGIMEN & LAB RESULTS Anticoag. Dx: Atrial fibrillation Current INR: 2.3 Current Coumadin Dose(mg): 5mg  qd EXCEPT wed takes 7.5 Regimen: same  Provider: Alwyn Ren Repeat testing in: 4 weeks Dose has been reviewed with patient or caretaker during this visit. Reviewed by: Lucious Groves  Anticoagulation Visit Questionnaire Coumadin dose missed/changed:  No Abnormal Bleeding Symptoms:  No  Any diet changes including alcohol intake, vegetables or greens since the last visit:  No Any illnesses or hospitalizations since the last visit:  No Any signs of clotting since the last visit (including chest discomfort, dizziness, shortness of breath, arm tingling, slurred speech, swelling or redness in leg):  No  MEDICATIONS SYNTHROID 50 MCG  TABS (LEVOTHYROXINE SODIUM) Take 1 tablet daily except take 1 1/2 tablets on Tues,Th, & Sat. **APPOINTMENT DUE** ADVAIR HFA 115-21 MCG/ACT  AERO (FLUTICASONE-SALMETEROL) Use 1-2 puffs daily HYDROCHLOROTHIAZIDE 25  MG  TABS (HYDROCHLOROTHIAZIDE) Hold ADVIL 200 MG TABS (IBUPROFEN) as needed WARFARIN SODIUM 5 MG TABS (WARFARIN SODIUM) Use as directed by anticoagulation clinic. CARDIZEM 120 MG TABS (DILTIAZEM HCL) take one tablet daily ADVAIR DISKUS 100-50 MCG/DOSE AEPB (FLUTICASONE-SALMETEROL) 1 inhalation every 12 hours SYMBICORT 160-4.5 MCG/ACT AERO (BUDESONIDE-FORMOTEROL FUMARATE) 1-2 puffs every 12 hrs as needed; GARGLE  & SPIT AFTER USE PROMETHAZINE VC/CODEINE 6.25-5-10 MG/5ML SYRP (PHENYLEPH-PROMETHAZINE-COD) 1 tsp every 6 hrs as needed cough TRAMADOL HCL 50 MG TABS (TRAMADOL HCL) 1 every 6 hrs as needed for joint pain in place of Advil    Appended Document: pt check; lab=bmet, lip, hep, cbcd, tsh 427.31, 995.20, 401.9.Marland KitchenMarland Kitchen

## 2010-08-01 ENCOUNTER — Ambulatory Visit: Payer: Self-pay | Admitting: Internal Medicine

## 2010-08-04 ENCOUNTER — Ambulatory Visit (INDEPENDENT_AMBULATORY_CARE_PROVIDER_SITE_OTHER): Payer: Medicare Other

## 2010-08-04 ENCOUNTER — Encounter: Payer: Self-pay | Admitting: Internal Medicine

## 2010-08-04 DIAGNOSIS — I4891 Unspecified atrial fibrillation: Secondary | ICD-10-CM

## 2010-08-04 DIAGNOSIS — Z7901 Long term (current) use of anticoagulants: Secondary | ICD-10-CM

## 2010-08-04 LAB — CONVERTED CEMR LAB: POC INR: 2.3

## 2010-08-10 NOTE — Medication Information (Signed)
Summary: pt check/nta   Managed by: Inactive PCP: Marga Melnick, MD  Supervising MD: Eden Emms MD, Theron Arista Indication 1: Atrial fibrillation Lab Used: [facility] [place] Site: [address] INR POC 2.3 INR RANGE 2-3  Vital Signs: Weight: 178 lbs.  Pulse Rate: 70  Blood Pressure:  116 / 64   Dietary changes: no    Health status changes: no    Bleeding/hemorrhagic complications: no    Recent/future hospitalizations: no    Any changes in medication regimen? no    Recent/future dental: no  Any missed doses?: yes     Details: missed 1 dose  Is patient compliant with meds? yes      Comments: Current dose: 5mg  daily except 7.5mg  on wed. continue current dose recheck in 4 weeks.....Marland KitchenMarland KitchenDoristine Devoid CMA  August 04, 2010 10:22 AM   Allergies: 1)  ! Amoxicillin  Anticoagulation Management History:      Positive risk factors for bleeding include an age of 40 years or older and history of GI bleeding.  The bleeding index is 'intermediate risk'.  Positive CHADS2 values include Age > 73 years old.  The start date was 07/18/2007.  Her last INR was 2.3.  Anticoagulation responsible provider: Eden Emms MD, Theron Arista.  INR POC: 2.3.    Anticoagulation Management Assessment/Plan:      The patient's current anticoagulation dose is Warfarin sodium 5 mg tabs: Use as directed by anticoagulation clinic..  The target INR is 2 - 3.  The next INR is due 4 weeks.  Anticoagulation instructions were given to patient.  Results were reviewed/authorized by Inactive.         Prior Anticoagulation Instructions: INR 1.7 Take 1.5 tablets today and tomorrow, then resume 1.5 tablets on each Monday and Friday and 1 tablet on all other days.

## 2010-08-15 ENCOUNTER — Encounter: Payer: Self-pay | Admitting: Internal Medicine

## 2010-08-24 ENCOUNTER — Encounter: Payer: Self-pay | Admitting: Internal Medicine

## 2010-08-31 ENCOUNTER — Ambulatory Visit (INDEPENDENT_AMBULATORY_CARE_PROVIDER_SITE_OTHER): Payer: Medicare Other | Admitting: *Deleted

## 2010-08-31 DIAGNOSIS — Z7901 Long term (current) use of anticoagulants: Secondary | ICD-10-CM

## 2010-08-31 NOTE — Patient Instructions (Signed)
Advise no change recheck 4 weeks Chemira Yetta Barre

## 2010-09-05 ENCOUNTER — Ambulatory Visit (INDEPENDENT_AMBULATORY_CARE_PROVIDER_SITE_OTHER): Payer: Medicare Other | Admitting: Internal Medicine

## 2010-09-05 ENCOUNTER — Encounter: Payer: Self-pay | Admitting: Internal Medicine

## 2010-09-05 VITALS — BP 140/68 | HR 67 | Ht 69.0 in | Wt 179.0 lb

## 2010-09-05 DIAGNOSIS — I447 Left bundle-branch block, unspecified: Secondary | ICD-10-CM

## 2010-09-05 DIAGNOSIS — I4891 Unspecified atrial fibrillation: Secondary | ICD-10-CM

## 2010-09-05 NOTE — Assessment & Plan Note (Signed)
Stable without intercurrent symptoms of lightheadedness

## 2010-09-05 NOTE — Assessment & Plan Note (Signed)
No significant intercurrent atrial fibrillation. I have rearranged the value going to the emergency room for her flecainide dose.

## 2010-09-05 NOTE — Patient Instructions (Signed)
Your physician recommends that you schedule a follow-up appointment in: YEAR WITH DR KLEIN  Your physician recommends that you continue on your current medications as directed. Please refer to the Current Medication list given to you today. 

## 2010-09-05 NOTE — Progress Notes (Signed)
HPI: [name] is a 77 y.o. female  seen Followup of recurrent symptomatic paroxysmal atrial fibrillation. This dates back a number of years. The  symptomatic episodes have been relatively infrequent occurring just a couple of times a year.They have sometimes lasted as long as 8-12 hours. They are associated with weakness and worsening shortness of breath; there has been no associated lightheadedness or chest pain.Because of this she was given a prescription for flecainide to use as a cocktail. It  was to be tried first in the emergency room. She has however had no interval episodes. The only attributable cause would be the discontinuation of her potassium wasting diuretic  Current Outpatient Prescriptions  Medication Sig Dispense Refill  . budesonide-formoterol (SYMBICORT) 160-4.5 MCG/ACT inhaler Inhale 2 puffs into the lungs 2 (two) times daily. Gargle and spit after use       . diltiazem (CARDIZEM CD) 120 MG 24 hr capsule Take 120 mg by mouth daily.        . Fluticasone-Salmeterol (ADVAIR DISKUS) 100-50 MCG/DOSE AEPB Inhale 1 puff into the lungs every 12 (twelve) hours.        . fluticasone-salmeterol (ADVAIR HFA) 115-21 MCG/ACT inhaler Inhale 2 puffs into the lungs daily.        Marland Kitchen ibuprofen (ADVIL) 200 MG tablet Take 200 mg by mouth every 6 (six) hours as needed.        Marland Kitchen levothyroxine (SYNTHROID, LEVOTHROID) 50 MCG tablet Take 50 mcg by mouth daily. 1 tab on M,W,F, & Sun and one & one half tablet all other days. Check TSH 3-4 months      . Phenyleph-Promethazine-Cod (PROMETHAZINE VC/CODEINE) 5-6.25-10 MG/5ML SYRP Take by mouth as directed.        . warfarin (COUMADIN) 5 MG tablet Take 5 mg by mouth daily. As directed per anticoagulation clinic       . hydrochlorothiazide 25 MG tablet Take 25 mg by mouth daily.        . traMADol (ULTRAM) 50 MG tablet Take 50 mg by mouth every 6 (six) hours as needed.          Allergies  Allergen Reactions  . Amoxicillin     REACTION: upset stomach     Past Medical History  Diagnosis Date  . Obstructive sleep apnea     CPAP Intolerant  . Rheumatoid arthritis     Dr.Truslow  . Cystitis 1974  . Thyroid disease     HYPOTHROIDISM  . DJD (degenerative joint disease)   . Idiopathic pulmonary fibrosis     IDIOPATHIC vs CHRONIC ERD  . Hyperlipidemia 2005    NMR LIPOPROFILE: LDL 144 (1570/732), HDL 61, TG 103., LDL GOAL = < 130  . Arrhythmia     RECURRENT ATRIAL FIBRILLATION    Past Surgical History  Procedure Date  . Abdominal hysterectomy     Abnormal pap smear; ovaries remaining  . Cholecystectomy 1966  . Appendectomy 1966  . Cystoscopy     Hematuria   . Retinal detachment surgery     OD-both eyes    Family History  Problem Relation Age of Onset  . Coronary artery disease Father   . Transient ischemic attack Father   . Asthma Mother     History   Social History  . Marital Status: Married    Spouse Name: N/A    Number of Children: N/A  . Years of Education: N/A   Occupational History  . Retired     Airline pilot   Social History  Main Topics  . Smoking status: Former Smoker    Quit date: 06/04/1985  . Smokeless tobacco: Not on file  . Alcohol Use: Yes     Social  . Drug Use: No  . Sexually Active: Not on file   Other Topics Concern  . Not on file   Social History Narrative   NO REGULAR EXERCISE6/2/11 [employer]M SIGNED APPOINTING HUSBAND JAMES Buford; OK TO LEAVE MESSAGE ON HOME # [phone]    Fourteen point review of systems was negative except as noted in HPI and PMH   PHYSICAL EXAMINATION  Blood pressure 140/68, pulse 67, height 5\' 9"  (1.753 m), weight 179 lb (81.194 kg).   Well developed and nourished in no acute distress HENT normal Neck supple with JVP-flat Carotids brisk and full without bruits Back without scoliosis or kyphosis Clear Regular rate and rhythm, no murmurs or gallops Abd-soft with active BS without hepatomegaly or midline pulsation Femoral pulses 2+ distal pulses  intact No Clubbing cyanosis edema Skin-warm and dry LN-neg submandibular and supraclavicular A & Oriented CN 3-12 normal  Grossly normal sensory and motor function Affect engaging .

## 2010-09-14 ENCOUNTER — Telehealth: Payer: Self-pay | Admitting: *Deleted

## 2010-09-14 MED ORDER — HYDROCODONE-HOMATROPINE 5-1.5 MG/5ML PO SYRP: ORAL_SOLUTION | ORAL | Status: DC

## 2010-09-14 NOTE — Telephone Encounter (Signed)
Hydromet 120 cc 1 teaspoon every 6 hours as needed for cough his appropriate. If she feels she needs an antibiotic and told she needed to  Be see the pulmonologist or  here in a

## 2010-09-14 NOTE — Telephone Encounter (Signed)
Spoke w/ pt says the pollen has been making her pulmonary fibrosis worse has been doing a lot of wheezing also mention that co worker came to work sick and thinks she may be getting sick also no fevers but has been doing some coughing would like to know if she could have cough med sent to pharmacy along w/ ATB feels ok but would like to have something and if no better she will come in for office visit. [facility].

## 2010-09-14 NOTE — Telephone Encounter (Signed)
Spoke w/ pt aware of information and rx sent to pharmacy.

## 2010-10-09 ENCOUNTER — Ambulatory Visit (INDEPENDENT_AMBULATORY_CARE_PROVIDER_SITE_OTHER): Payer: Medicare Other | Admitting: Family Medicine

## 2010-10-09 ENCOUNTER — Ambulatory Visit (INDEPENDENT_AMBULATORY_CARE_PROVIDER_SITE_OTHER)
Admission: RE | Admit: 2010-10-09 | Discharge: 2010-10-09 | Disposition: A | Discharge status: Home / Self Care | Payer: Medicare Other | Source: Ambulatory Visit | Attending: Family Medicine | Admitting: Family Medicine

## 2010-10-09 VITALS — BP 130/64 | HR 65 | Temp 98.9°F | Wt 177.5 lb

## 2010-10-09 DIAGNOSIS — R05 Cough: Secondary | ICD-10-CM | POA: Insufficient documentation

## 2010-10-09 DIAGNOSIS — I4891 Unspecified atrial fibrillation: Secondary | ICD-10-CM

## 2010-10-09 DIAGNOSIS — J8410 Pulmonary fibrosis, unspecified: Secondary | ICD-10-CM

## 2010-10-09 DIAGNOSIS — Z7901 Long term (current) use of anticoagulants: Secondary | ICD-10-CM

## 2010-10-09 LAB — POCT INR: INR: 2.1

## 2010-10-09 MED ORDER — AZITHROMYCIN 250 MG PO TABS: 250.0000 mg | ORAL_TABLET | Freq: Every day | ORAL | Status: AC

## 2010-10-09 MED ORDER — BENZONATATE 200 MG PO CAPS: 200.0000 mg | ORAL_CAPSULE | Freq: Three times a day (TID) | ORAL | Status: AC | PRN

## 2010-10-09 MED ORDER — HYDROCODONE-HOMATROPINE 5-1.5 MG/5ML PO SYRP: ORAL_SOLUTION | ORAL | Status: DC

## 2010-10-09 NOTE — Patient Instructions (Signed)
Please go to the [place] office and get your chest xray- we'll call you with these results Start the Azithromycin as directed Use the cough pills for daytime cough and the syrup for night cough Call with any questions or concerns Hang in there!!

## 2010-10-09 NOTE — Progress Notes (Signed)
  Subjective:    Patient ID: [name], female    DOB: 13-Nov-1931, 77 y.o.   MRN: [id number]  HPI Cough- 'the last time i felt this bad i had PNA'.  Has underlying pulmonary fibrosis.  Has been coughing for 4 weeks.  Thought sxs were allergy related b/c sxs started w/ 'itchy throat'.  Has been using Hycodan cough syrup w/ some relief to allow pt to sleep.  Has SOB at baseline but pt feels this is worse than usual.  Cough is productive of 'white frothy stuff'.  No fevers.  + sick contacts.  Denies facial pain/pressure, ear pain.  Mild nasal congestion and sore throat.  Has never used albuterol inhaler, only using Advair prn.   Review of Systems For ROS see HPI     Objective:   Physical Exam  Constitutional: She appears well-developed and well-nourished. No distress.  HENT:  Head: Normocephalic and atraumatic.       TMs normal bilaterally Mild nasal congestion Throat w/out erythema, edema, or exudate  Eyes: Conjunctivae and EOM are normal. Pupils are equal, round, and reactive to light.  Neck: Normal range of motion. Neck supple.  Cardiovascular: Normal rate, normal heart sounds and intact distal pulses.        Irregular S1/S2  Pulmonary/Chest: Effort normal. No respiratory distress. She has wheezes.       + hacking cough Pt's BS can best be described as a inspiratory honking- completely disappeared w/ albuterol neb  Lymphadenopathy:    She has no cervical adenopathy.          Assessment & Plan:

## 2010-10-10 ENCOUNTER — Encounter: Payer: Self-pay | Admitting: Family Medicine

## 2010-10-10 DIAGNOSIS — Z7901 Long term (current) use of anticoagulants: Secondary | ICD-10-CM | POA: Insufficient documentation

## 2010-10-10 NOTE — Assessment & Plan Note (Signed)
Given pt's underlying lung dz will start Zpack and get CXR.  Encouraged her to use inhalers regularly rather than prn.  Reviewed supportive care and red flags that should prompt return.  Pt expressed understanding and is in agreement w/ plan.

## 2010-10-10 NOTE — Assessment & Plan Note (Signed)
INR at goal today.  Continue current regimen and repeat INR in 2 weeks due to abx use.

## 2010-10-17 NOTE — Assessment & Plan Note (Signed)
[facility]                            CARDIOLOGY OFFICE NOTE   NAME:[name]                      MRN:          [id number]  DATE:09/02/2007                            DOB:          [birth year]    PRIMARY CARE PHYSICIAN:  Titus Dubin. Alwyn Ren, MD, FACP, FCCP.   REASON FOR PRESENTATION:  Evaluate the patient with atrial fibrillation.   HISTORY OF PRESENT ILLNESS:  The patient is a lovely 77 year old with  recently diagnosed atrial fibrillation.  She had a rapid ventricular  rate.  I saw her and started her on Cardizem and Coumadin given her age  and gender.  She converted to atrial fibrillation spontaneously some 24  hours or so after our office visit.  I did send her for a dobutamine  echocardiogram to further evaluate dyspnea and make sure she has a  structurally normal heart.  She did have atrial fibrillation,  paroxysmally during this test.  This demonstrated no regional wall  motion abnormalities.  She has a conduction defect and so has some  abnormal septal motion.  She did have an echocardiogram demonstrating EF  of 55% with no evidence of valvular heart disease or significant  abnormalities otherwise.   She returns now and says that she is not having any symptomatic  palpitations.  She may notice her heart racing a little bit when she  climbs a flight of stairs.  However, she feels much better than she did  when I saw her initially.  She continues to have a chronic dyspnea.,  probably related to her pulmonary fibrosis.  She is fatigued.   PAST MEDICAL HISTORY:  1. Hypothyroidism.  2. Pulmonary fibrosis.  3. Sleep apnea (she does not tolerate CPAP).  4. Paroxysmal atrial fibrillation.  5. Hysterectomy.  6. Cholecystectomy.   ALLERGIES:  None.   MEDICATIONS:  1. Synthroid 50 mcg daily.  2. Hydrochlorothiazide 12 1/2 mg daily.  3. Advair 250/50 [name].i.d.  4. Synthroid 75 mcg Tuesday and Saturday.  5. Coumadin per our Coumadin  clinic.  6. Cardizem 120 mg daily.   REVIEW OF SYSTEMS:  As stated in the HPI otherwise have other systems.   PHYSICAL EXAMINATION:  GENERAL:  The patient is in no distress.  VITAL SIGNS:  Blood pressure 137/70, heart rate 65 and regular.  HEENT:  Eyes unremarkable, pupils equal and reactive to light, fundi not  visualized, oral mucosa unremarkable.  NECK:  No jugular distention, waveform within normal limits, carotid  upstroke brisk and symmetric, no bruits, no thyromegaly.  LYMPHATICS:  No cervical, axillary, inguinal adenopathy.  LUNGS:  Diffuse bilateral fine crackles.  BACK:  No costovertebral mass.  CHEST:  Unremarkable.  HEART:  PMI not displaced or sustained, S1-S2 within normal.  No S3, no  S4, no clicks, rubs, murmurs.  ABDOMEN:  Flat, positive bowel sounds normal frequency and pitch.  No  bruits, rebound, guarding or midline pulsatile mass.  No hepatomegaly,  no splenomegaly.  SKIN:  No rash.  EXTREMITIES:  Pulse 2+ throughout.  No edema, cyanosis, clubbing.  NEUROLOGICAL:  Oriented to person, place, time, cranial  nerves II-XII  grossly intact, motor grossly intact.   ASSESSMENT/PLAN:  1. Atrial fibrillation.  The patient has atrial fibrillation but she      is converted to sinus rhythm and has had no prolonged symptomatic      episodes on the Cardizem.  She is tolerating Coumadin.  She      understands risk and benefits of this, and I believe, given her age      and gender, that it is an indication (at age 59 it will be a class      I indication).  2. Dyspnea.  This is most likely related to pulmonary fibrosis.  She      had negative dobutamine echo with no evidence of ischemia.  No      further workup is planned.  3. Fatigue.  This may well be related to the sleep apnea, and I have      encouraged her to follow with Dr. Alwyn Ren or a new pulmonologist to      further consider whether she would be able to tolerate CPAP.  4. Follow-up.  I will see her back in 6 months  or sooner if needed.     Rollene Rotunda, MD, [facility]  Electronically Signed    JH/MedQ  DD: 09/02/2007  DT: 09/02/2007  Job #: [id number]   cc:   Titus Dubin. Alwyn Ren, MD,FACP,FCCP

## 2010-10-17 NOTE — Assessment & Plan Note (Signed)
[facility]                            CARDIOLOGY OFFICE NOTE   NAME:[name], [name]                      MRN:          [id number]  DATE:07/21/2007                            DOB:          12-11-1931    PRIMARY CARE PHYSICIAN:  Marga Melnick, MD.   REASON FOR CONSULTATION:  Evaluate patient with palpitations.   HISTORY OF PRESENT ILLNESS:  The patient is a lovely 77 year old white  female with mild significant past cardiac history.  She did have a  stress perfusion study, adenosine perfusion study in 2004 because of  some jaw discomfort.  She had a hypotensive episode with this and really  felt terrible during the test.  However, there was no evidence of  ischemia or infarct and she had well-preserved ejection fraction.   Over the last month or so,  the patient has had palpitations.  These  last sometimes 24-36 hours.  She feels her heart beating quickly.  She  feels drained with this.  She has not had any pre-syncope or syncope.  She has had about three of these episodes.  She does not recall having  this before.  When she was last in Dr. Frederik Pear office in February she  had EKG with a new left bundle branch block, but was in sinus rhythm.  Today, coincidentally,  she felt the heart rate increase and is in  atrial fibrillation.   The patient also describes increased dyspnea with exertion.  She has  noticed this with activities such as climbing stairs.  She had an  episode about a month ago pulling garbage cans up her daughter's  driveway.  She was quite dyspneic with this and actually got some  discomfort in her jaw.  This may have been similar to what she had back  in 2004.  She has not had chest discomfort otherwise.  She has not been  having any neck or arm discomfort.  She has not been having any PND or  orthopnea.   PAST MEDICAL HISTORY:  1. Hypothyroidism.  2. Pulmonary fibrosis.  3. Sleep apnea (she did not tolerate CPAP).   PAST  SURGICAL HISTORY:  Hysterectomy, cholecystectomy.   ALLERGIES:  None.   MEDICATIONS:  1. Synthroid 50 mcg daily except Tuesdays and Saturdays she takes 75      mg.  2. Advair.  3. Hydrochlorothiazide 12.5 mg daily.  4. Advil.   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  Her husband is a patient of mine.  She has one child and one  grandchild.   FAMILY HISTORY:  Noncontributory for early coronary artery disease,  sudden cardiac death, syncope or heart failure.   REVIEW OF SYSTEMS:  As stated in the HPI, positive for mild cough,  reflux, mild lower extremity swelling.  Negative for other systems.   PHYSICAL EXAMINATION:  The patient the patient is well-appearing and in  no distress.  Blood pressure 133/85, heart rate 90 and irregular, weight  188 pounds.  HEENT: Eyelids unremarkable. Pupils equal, round, and reactive to light.  Fundi  not visualized. Oral mucosa is unremarkable.  NECK: No jugular venous distention at 45 degrees. Carotid upstroke brisk  and symmetrical. No bruits, no thyromegaly.  LYMPHATICS: No cervical, axillary or inguinal adenopathy.  LUNGS: Diffuse bilateral fine crackles. No wheezing.  BACK: No costovertebral angle tenderness.  CHEST: Unremarkable.  HEART: PMI not displaced or sustained. S1, S2 within normal limits. No  S3. No murmurs.  ABDOMEN: Flat, positive bowel sounds, normal in frequency and pitch. No  bruits. No rebound. No guarding. No midline pulsatile mass. No  hepatomegaly, no splenomegaly.  SKIN: No rashes, no nodules.  EXTREMITIES: 2+ pulses throughout. No edema or cyanosis or clubbing.  NEURO: Oriented to person, place and time. Cranial nerves II-XII grossly  intact. Motor grossly intact.   EKG:  Atrial fibrillation, left bundle branch block and rapid rate.   ASSESSMENT/PLAN:  1. Arrhythmia.  The patient has atrial fibrillation with rapid      ventricular response.  At this point, I am going to add  Cardizem      120 mg CD daily.  She is also going to need to start Coumadin.  I      am going to plan for rate control and anticoagulation to see if she      has fewer or less sustained paroxysms.  I am going to choose the      Coumadin.  Though her [place] score is low, her ages 77 almost, which      puts her close to a class I indication for Coumadin.  She and I      discussed this.  I will make sure with Dr. Alwyn Ren that her thyroid      has been well controlled.  2. Throat discomfort.  The patient describes this as well as      increasing dyspnea. This could be pulmonary fibrosis.  She needs a      stress test.  She did not tolerate the adenosine perfusion study in      the past.  Therefore, I will do a dobutamine echocardiogram.  This      will also allow me to visualize her heart to some extent to see if      any further imaging is necessary.  3. Sleep apnea.  This may be contributing to some dysrhythmia along      with her primary pulmonary problems.  I will suggested that she      continue to follow the pulmonologist and      choose a sleep doctor to see if they can work with her to wear the      CPAP.  4. Follow-up.  I will see her back in about 6 weeks or sooner if      needed.     Rollene Rotunda, MD, [facility]  Electronically Signed    JH/MedQ  DD: 07/21/2007  DT: 07/21/2007  Job #: [id number]   cc:   Titus Dubin. Alwyn Ren, MD,FACP,FCCP

## 2010-10-17 NOTE — Assessment & Plan Note (Signed)
[name]                            CARDIOLOGY OFFICE NOTE   NAME:[name], [name]                      MRN:          [id number]  DATE:02/05/2008                            DOB:          March 27, 1932    PRIMARY CARE PHYSICIAN:  Megan Dubin. Alwyn Ren, MD, FACP, FCCP   REASON FOR PRESENTATION:  Evaluate the patient with atrial fibrillation.   HISTORY OF PRESENT ILLNESS:  The patient is now 77 years old.  She has  done quite well since I last saw her.  She has had a few skipped beats,  but no sustained tachy palpitations.  She does not think she has been in  atrial fibrillation.  She has had no chest discomfort, neck or arm  discomfort.  She gets no new shortness of breath or she gets dyspneic  with moderate-to-significant exertion such as walking up a hill.  She  does not have any PND or orthopnea.  She does get some epigastric  discomfort and burping that she associates with certain foods.   PAST MEDICAL HISTORY:  Paroxysmal atrial fibrillation, hypothyroidism,  pulmonary fibrosis, sleep apnea, (she does not tolerate CPAP),  hysterectomy, and cholecystectomy.   ALLERGIES:  None.   MEDICATIONS:  1. Synthroid 50 mg 5 days a week, 75 mg Tuesdays and Saturdays.  2. Coumadin.  3. Cardizem 120 mg daily.   REVIEW OF SYSTEMS:  As stated in the HPI and otherwise negative for  other systems.   PHYSICAL EXAMINATION:  GENERAL:  The patient is in no distress.  VITAL SIGNS:  Blood pressure 137/76, heart rate 56 and regular, and  weight 188 pounds.  HEENT:  Eyelids unremarkable; pupils equal, round and reactive to light;  fundi not visualized; oral mucosa unremarkable.  NECK:  No jugular venous distension at 45 degrees; carotid upstroke  brisk and symmetrical; no bruits, no thyromegaly.  LYMPHATICS:  No cervical, axillary or inguinal adenopathy.  LUNGS:  Clear to auscultation bilaterally.  BACK:  No costovertebral angle tenderness.  CHEST:  Unremarkable.  HEART:  PMI not displaced or sustained; S1 and S2 within normal limits;  no S3, no S4; no clicks, no rubs, no murmurs.  ABDOMEN:  Flat; positive bowel sounds, normal in frequency and pitch; no  bruits, no rebound, no guarding; no midline pulsatile mass; no  hepatomegaly, no splenomegaly.  SKIN:  No rashes, no nodules.  EXTREMITIES:  2+ pulses throughout; no edema, no cyanosis, no clubbing.  NEURO:  Oriented to person, place, and time; cranial nerves II through  XII grossly intact; motor grossly intact.   EKG sinus bradycardia, rate 56, left bundle-branch block, left axis  deviation, and first-degree AV block.   ASSESSMENT AND PLAN:  1. Atrial fibrillation.  The patient has had no symptomatic sustained      paroxysmal risk.  She will remain on the Coumadin, however, given      her thromboembolic risk.  2. Hypertension.  Blood pressure is well controlled.  She will      continue medications as listed.  3. Bradycardia/left bundle-branch block.  The patient is not having  any symptoms consistent with symptomatic bradyarrhythmia.  We will      watch this closely.  4. Sleep apnea.  She does not tolerate continuous positive airway      pressure.  5. Pulmonary fibrosis.  She follows with Dr. Alwyn [name].  She has not seen      a pulmonologist since Dr. Jayme Cloud left. She does not think there      is an indication.  6. Followup.  I will see the patient again in 12 months or sooner if      she has any problems.     Rollene Rotunda, MD, [facility]  Electronically Signed    JH/MedQ  DD: 02/05/2008  DT: 02/06/2008  Job #: [id number]   cc:   Megan Dubin. Alwyn Ren, MD,FACP,FCCP

## 2010-10-20 NOTE — Procedures (Signed)
NAMELATANGA, [name]               ACCOUNT NO.:  [id number]   MEDICAL RECORD NO.:  [id number]          PATIENT TYPE:  OUT   LOCATION:  SLEEP CENTER                 FACILITY:  [facility]   PHYSICIAN:  Marcelyn Bruins, M.D. [facility] DATE OF BIRTH:  1932/05/07   DATE OF STUDY:  06/06/2005                              NOCTURNAL POLYSOMNOGRAM   REFERRING PHYSICIAN:  Dr. Danice Goltz   INDICATIONS FOR PROCEDURE:  Hypersomnia with sleep apnea.   EPWORTH SCORE:  5   SLEEP ARCHITECTURE:  The patient had a total sleep time of 303 minutes with  decreased slow wave sleep and REM. Sleep onset latency was prolonged at 44  minutes and REM onset was fairly rapid at 41 minutes. Sleep efficiency was  76%.   RESPIRATORY DATA:  The patient was found to have 36 hypopneas and 13 apneas  for a respiratory disturbance index of 10 events per hour. Events were not  positional but there was moderate to loud snoring noted.   OXYGEN DATA:  There was O2 desaturation as low as 80% associated with the  obstructive events.   CARDIAC DATA:  Rare PVCs were noted.   MOVEMENT/PARASOMNIA:  The patient was found to have 124 leg jerks with three  per hour resulting in arousal or awakening.   IMPRESSION/RECOMMENDATION:  1.  Mild obstructive sleep apnea with a respiratory disturbance index of 10      events per hour and oxygen desaturation as low as 80%.  Treatment for this degree of sleep apnea may include weight loss alone if  applicable, upper airway surgery, oral appliance, and CPAP. Treatment  options should be based on the patient's lifestyle and degree of  symptomatology.  1.  Large numbers of leg jerks with significant sleep disruption. It is      unclear how much of this is due to the patient's sleep disordered      breathing, and how much may be due to a movement disorder. Clinical      correlation is suggested.          ______________________________  Marcelyn Bruins, M.D. [facility]  Diplomate, American Board of Sleep  Medicine    KC/MEDQ  D:  06/13/2005 11:14:34  T:  06/13/2005 15:03:13  Job:  [id number]

## 2010-11-03 ENCOUNTER — Ambulatory Visit (INDEPENDENT_AMBULATORY_CARE_PROVIDER_SITE_OTHER): Payer: Medicare Other

## 2010-11-03 VITALS — BP 130/60 | HR 66 | Temp 97.6°F | Wt 177.8 lb

## 2010-11-03 DIAGNOSIS — Z7901 Long term (current) use of anticoagulants: Secondary | ICD-10-CM

## 2010-11-03 LAB — POCT INR: INR: 2.4

## 2010-12-15 ENCOUNTER — Ambulatory Visit (INDEPENDENT_AMBULATORY_CARE_PROVIDER_SITE_OTHER): Payer: Medicare Other | Admitting: *Deleted

## 2010-12-15 DIAGNOSIS — Z7901 Long term (current) use of anticoagulants: Secondary | ICD-10-CM

## 2010-12-15 DIAGNOSIS — I4891 Unspecified atrial fibrillation: Secondary | ICD-10-CM

## 2010-12-15 NOTE — Patient Instructions (Signed)
Per Dr. Alwyn Ren change to 5mg  daily recheck 3 weeks [name]

## 2010-12-17 ENCOUNTER — Other Ambulatory Visit: Payer: Self-pay | Admitting: Internal Medicine

## 2010-12-20 ENCOUNTER — Other Ambulatory Visit: Payer: Self-pay | Admitting: Internal Medicine

## 2011-01-05 ENCOUNTER — Ambulatory Visit (INDEPENDENT_AMBULATORY_CARE_PROVIDER_SITE_OTHER): Payer: Medicare Other | Admitting: *Deleted

## 2011-01-05 DIAGNOSIS — Z7901 Long term (current) use of anticoagulants: Secondary | ICD-10-CM

## 2011-01-05 DIAGNOSIS — I4891 Unspecified atrial fibrillation: Secondary | ICD-10-CM

## 2011-01-05 NOTE — Patient Instructions (Signed)
Take 5mg  daily-- return in 4 weeks per Dr.Hopper.

## 2011-02-17 ENCOUNTER — Other Ambulatory Visit: Payer: Self-pay | Admitting: Internal Medicine

## 2011-03-08 ENCOUNTER — Other Ambulatory Visit: Payer: Self-pay | Admitting: Internal Medicine

## 2011-03-08 DIAGNOSIS — Z79899 Other long term (current) drug therapy: Secondary | ICD-10-CM

## 2011-03-09 ENCOUNTER — Ambulatory Visit (INDEPENDENT_AMBULATORY_CARE_PROVIDER_SITE_OTHER): Payer: Medicare Other | Admitting: *Deleted

## 2011-03-09 DIAGNOSIS — Z7901 Long term (current) use of anticoagulants: Secondary | ICD-10-CM

## 2011-03-09 DIAGNOSIS — I4891 Unspecified atrial fibrillation: Secondary | ICD-10-CM

## 2011-03-09 NOTE — Patient Instructions (Signed)
Return to office in 1 weeks PT/INR   New dosing: 7.5 mg today and tomorrow then resume 5 mg daily.

## 2011-03-15 ENCOUNTER — Ambulatory Visit: Payer: Medicare Other

## 2011-03-16 ENCOUNTER — Ambulatory Visit (INDEPENDENT_AMBULATORY_CARE_PROVIDER_SITE_OTHER): Payer: Medicare Other | Admitting: *Deleted

## 2011-03-16 DIAGNOSIS — Z7901 Long term (current) use of anticoagulants: Secondary | ICD-10-CM

## 2011-03-16 DIAGNOSIS — I4891 Unspecified atrial fibrillation: Secondary | ICD-10-CM

## 2011-03-16 LAB — POCT INR: INR: 2.7

## 2011-03-16 NOTE — Patient Instructions (Signed)
Return to office in 4 weeks    No change continue 5 mg daily

## 2011-04-05 ENCOUNTER — Ambulatory Visit (INDEPENDENT_AMBULATORY_CARE_PROVIDER_SITE_OTHER): Payer: Medicare Other | Admitting: *Deleted

## 2011-04-05 VITALS — HR 75 | Resp 16 | Wt 179.5 lb

## 2011-04-05 DIAGNOSIS — I4891 Unspecified atrial fibrillation: Secondary | ICD-10-CM

## 2011-04-05 DIAGNOSIS — Z7901 Long term (current) use of anticoagulants: Secondary | ICD-10-CM

## 2011-04-05 NOTE — Patient Instructions (Signed)
No changes on dosage, recheck in one month. Per MD "[place]".

## 2011-04-28 ENCOUNTER — Other Ambulatory Visit: Payer: Self-pay | Admitting: Internal Medicine

## 2011-04-30 NOTE — Telephone Encounter (Signed)
RX called in, would not go through electronically  

## 2011-05-20 ENCOUNTER — Emergency Department (HOSPITAL_COMMUNITY)
Admission: EM | Admit: 2011-05-20 | Discharge: 2011-05-20 | Disposition: A | Discharge status: Home / Self Care | Payer: Medicare Other | Attending: Emergency Medicine | Admitting: Emergency Medicine

## 2011-05-20 ENCOUNTER — Other Ambulatory Visit: Payer: Self-pay

## 2011-05-20 ENCOUNTER — Encounter (HOSPITAL_COMMUNITY): Payer: Self-pay

## 2011-05-20 ENCOUNTER — Emergency Department (HOSPITAL_COMMUNITY): Payer: Medicare Other

## 2011-05-20 DIAGNOSIS — R059 Cough, unspecified: Secondary | ICD-10-CM | POA: Insufficient documentation

## 2011-05-20 DIAGNOSIS — E039 Hypothyroidism, unspecified: Secondary | ICD-10-CM | POA: Insufficient documentation

## 2011-05-20 DIAGNOSIS — G4733 Obstructive sleep apnea (adult) (pediatric): Secondary | ICD-10-CM | POA: Insufficient documentation

## 2011-05-20 DIAGNOSIS — R0609 Other forms of dyspnea: Secondary | ICD-10-CM | POA: Insufficient documentation

## 2011-05-20 DIAGNOSIS — R0602 Shortness of breath: Secondary | ICD-10-CM | POA: Insufficient documentation

## 2011-05-20 DIAGNOSIS — I447 Left bundle-branch block, unspecified: Secondary | ICD-10-CM | POA: Insufficient documentation

## 2011-05-20 DIAGNOSIS — J40 Bronchitis, not specified as acute or chronic: Secondary | ICD-10-CM | POA: Insufficient documentation

## 2011-05-20 DIAGNOSIS — E785 Hyperlipidemia, unspecified: Secondary | ICD-10-CM | POA: Insufficient documentation

## 2011-05-20 DIAGNOSIS — R0989 Other specified symptoms and signs involving the circulatory and respiratory systems: Secondary | ICD-10-CM | POA: Insufficient documentation

## 2011-05-20 DIAGNOSIS — M069 Rheumatoid arthritis, unspecified: Secondary | ICD-10-CM | POA: Insufficient documentation

## 2011-05-20 DIAGNOSIS — J8410 Pulmonary fibrosis, unspecified: Secondary | ICD-10-CM

## 2011-05-20 DIAGNOSIS — I4891 Unspecified atrial fibrillation: Secondary | ICD-10-CM | POA: Insufficient documentation

## 2011-05-20 DIAGNOSIS — R05 Cough: Secondary | ICD-10-CM | POA: Insufficient documentation

## 2011-05-20 LAB — BASIC METABOLIC PANEL
CO2: 22 mEq/L (ref 19–32)
Chloride: 102 mEq/L (ref 96–112)
GFR calc Af Amer: 59 mL/min — ABNORMAL LOW (ref >90)
Potassium: 3.2 mEq/L — ABNORMAL LOW (ref 3.5–5.1)
Sodium: 137 mEq/L (ref 135–145)

## 2011-05-20 LAB — DIFFERENTIAL
Basophils Absolute: 0.1 10*3/uL (ref 0.0–0.1)
Basophils Relative: 1 % (ref 0–1)
Lymphocytes Relative: 10 % — ABNORMAL LOW (ref 12–46)
Neutro Abs: 7 10*3/uL (ref 1.7–7.7)
Neutrophils Relative %: 85 % — ABNORMAL HIGH (ref 43–77)

## 2011-05-20 LAB — CBC
HCT: 40.8 % (ref 36.0–46.0)
Hemoglobin: 13.6 g/dL (ref 12.0–15.0)
MCHC: 33.3 g/dL (ref 30.0–36.0)
RBC: 4.26 MIL/uL (ref 3.87–5.11)
RDW: 13.9 % (ref 11.5–15.5)

## 2011-05-20 LAB — PRO B NATRIURETIC PEPTIDE: Pro B Natriuretic peptide (BNP): 1384 pg/mL — ABNORMAL HIGH (ref 0–450)

## 2011-05-20 LAB — PROTIME-INR
INR: 1.64 — ABNORMAL HIGH (ref 0.00–1.49)
Prothrombin Time: 19.7 seconds — ABNORMAL HIGH (ref 11.6–15.2)

## 2011-05-20 LAB — TROPONIN I: Troponin I: <0.3 ng/mL (ref <0.30)

## 2011-05-20 MED ORDER — IPRATROPIUM BROMIDE 0.02 % IN SOLN
RESPIRATORY_TRACT | Status: AC
  Administered 2011-05-20: 14:00:00
  Filled 2011-05-20: qty 2.5

## 2011-05-20 MED ORDER — ALBUTEROL SULFATE (5 MG/ML) 0.5% IN NEBU
INHALATION_SOLUTION | RESPIRATORY_TRACT | Status: AC
  Administered 2011-05-20: 14:00:00
  Filled 2011-05-20: qty 2

## 2011-05-20 MED ORDER — METHYLPREDNISOLONE SODIUM SUCC 125 MG IJ SOLR
INTRAMUSCULAR | Status: AC
  Administered 2011-05-20: 14:00:00
  Filled 2011-05-20: qty 2

## 2011-05-20 MED ORDER — ALBUTEROL SULFATE HFA 108 (90 BASE) MCG/ACT IN AERS: 2.0000 | INHALATION_SPRAY | RESPIRATORY_TRACT | Status: DC | PRN

## 2011-05-20 MED ORDER — PREDNISONE 50 MG PO TABS: 50.0000 mg | ORAL_TABLET | Freq: Every day | ORAL | Status: DC

## 2011-05-20 NOTE — ED Notes (Signed)
ZOX:[id number]<AV> Expected date:05/20/11<BR> Expected time:12:52 PM<BR> Means of arrival:Ambulance<BR> Comments:<BR> EMS 64 GC, 79 yof sob, pulmonary fibrosis

## 2011-05-20 NOTE — ED Provider Notes (Signed)
History     CSN: [id number] Arrival date & time: 05/20/2011  1:21 PM   First MD Initiated Contact with Patient 05/20/11 1404      Chief Complaint  Patient presents with  . Shortness of Breath    1 week  . Cough    white frothy sputum    (Consider location/radiation/quality/duration/timing/severity/associated sxs/prior treatment) Patient is a 77 y.o. female presenting with shortness of breath and cough. The history is provided by the patient.  Shortness of Breath  Associated symptoms include cough and shortness of breath.  Cough Associated symptoms include shortness of breath.  She has a history of pulmonary fibrosis and also history of atrial fibrillation. For the last 3 weeks, she has had a cough which is productive of clear to white sputum. Cough has been getting worse and she states that today she was just worn out by the cough. She has noted a dramatic increase in her baseline exertional dyspnea. She can normally go up a flight of steps before getting dyspneic, today she was not able to take a shower without developing dyspnea. She denies any chest pain, heaviness, tightness, or pressure. Fever, chills, sweats. She went to an urgent care Center today because of her worsening dyspnea, and she was noted to have an oxygen saturation of 82% on room air and she was transferred here by ambulance. He received albuterol, Atrovent, and Solu-Medrol in the ambulance and states that there has been some improvement with that. Symptoms are severe. She has been sick like this in the past, she has usually had pneumonia.  Past Medical History  Diagnosis Date  . Obstructive sleep apnea     CPAP Intolerant  . Rheumatoid arthritis     Dr.Truslow  . Cystitis 1974  . Thyroid disease     HYPOTHROIDISM  . DJD (degenerative joint disease)   . Idiopathic pulmonary fibrosis     IDIOPATHIC vs CHRONIC ERD  . Hyperlipidemia 2005    NMR LIPOPROFILE: LDL 144 (1570/732), HDL 61, TG 103., LDL GOAL = < 130    . Atrial fibrillation     Paroxysmal  . Left bundle branch block   . Osteoporosis     Past Surgical History  Procedure Date  . Abdominal hysterectomy     Abnormal pap smear; ovaries remaining  . Cholecystectomy 1966  . Appendectomy 1966  . Cystoscopy     Hematuria   . Retinal detachment surgery     OD-both eyes    Family History  Problem Relation Age of Onset  . Coronary artery disease Father   . Transient ischemic attack Father   . Asthma Mother     History  Substance Use Topics  . Smoking status: Former Smoker    Quit date: 06/04/1985  . Smokeless tobacco: Not on file  . Alcohol Use: Yes     Social    OB History    Grav Para Term Preterm Abortions TAB SAB Ect Mult Living                  Review of Systems  Respiratory: Positive for cough and shortness of breath.   All other systems reviewed and are negative.    Allergies  Amoxicillin  Home Medications   Current Outpatient Rx  Name Route Sig Dispense Refill  . DILTIAZEM HCL 120 MG PO TABS  TAKE ONE TABLET BY MOUTH EVERY DAY 90 tablet 3  . FLUTICASONE-SALMETEROL 100-50 MCG/DOSE IN AEPB Inhalation Inhale 1 puff into the lungs every  12 (twelve) hours.      Marland Kitchen HYDROCODONE-HOMATROPINE 5-1.5 MG/5ML PO SYRP Oral Take 5 mLs by mouth every 6 (six) hours as needed. For cough.     . IBUPROFEN 200 MG PO TABS Oral Take 600 mg by mouth every 6 (six) hours as needed. For pain.    Marland Kitchen LEVOTHYROXINE SODIUM 50 MCG PO TABS Oral Take 50-75 mcg by mouth daily. Take 1.5 tab on Tues, Thurs and Saturday, take 1 tab daily otherwise     . WARFARIN SODIUM 5 MG PO TABS Oral Take 5 mg by mouth daily.        BP 134/60  Pulse 87  Temp(Src) 97.8 F (36.6 C) (Oral)  Resp 18  Ht 5\' 8"  (1.727 m)  Wt 180 lb (81.647 kg)  BMI 27.37 kg/m2  SpO2 97%  Physical Exam  Nursing note and vitals reviewed.  77 year old female who appears mildly to moderately dyspneic at rest. Vital signs are normal in the emergency department, but oxygen  saturation was 82% at urgent care Center. It is normocephalic and atraumatic. PERRLA, EOMI. Oropharynx is clear. Neck is supple without adenopathy or JVD. Back is nontender and there is no CVA tenderness. Lungs have diffuse Velcro type rales consistent with pulmonary fibrosis. Heart is irregular without murmur. Cardiac monitor shows heart rate mainly 110-120. Abdomen is soft, flat, nontender without masses or hepatosplenomegaly. Extremities have no cyanosis or edema, full range of motion present. Skin is warm and moist without rash. Neurologic: Mental status is normal, cranial nerves are intact, there are no motor or sensory deficits. Psychiatric: No abnormalities of mood or affect.  ED Course  Procedures (including critical care time)   Labs Reviewed  CBC  DIFFERENTIAL  BASIC METABOLIC PANEL  PRO B NATRIURETIC PEPTIDE  TROPONIN I   No results found. Results for orders placed during the hospital encounter of 05/20/11  CBC      Component Value Range   WBC 8.2  4.0 - 10.5 (K/uL)   RBC 4.26  3.87 - 5.11 (MIL/uL)   Hemoglobin 13.6  12.0 - 15.0 (g/dL)   HCT 16.1  09.6 - 04.5 (%)   MCV 95.8  78.0 - 100.0 (fL)   MCH 31.9  26.0 - 34.0 (pg)   MCHC 33.3  30.0 - 36.0 (g/dL)   RDW 40.9  81.1 - 91.4 (%)   Platelets 245  150 - 400 (K/uL)  DIFFERENTIAL      Component Value Range   Neutrophils Relative 85 (*) 43 - 77 (%)   Neutro Abs 7.0  1.7 - 7.7 (K/uL)   Lymphocytes Relative 10 (*) 12 - 46 (%)   Lymphs Abs 0.8  0.7 - 4.0 (K/uL)   Monocytes Relative 4  3 - 12 (%)   Monocytes Absolute 0.3  0.1 - 1.0 (K/uL)   Eosinophils Relative 0  0 - 5 (%)   Eosinophils Absolute 0.0  0.0 - 0.7 (K/uL)   Basophils Relative 1  0 - 1 (%)   Basophils Absolute 0.1  0.0 - 0.1 (K/uL)  BASIC METABOLIC PANEL      Component Value Range   Sodium 137  135 - 145 (mEq/L)   Potassium 3.2 (*) 3.5 - 5.1 (mEq/L)   Chloride 102  96 - 112 (mEq/L)   CO2 22  19 - 32 (mEq/L)   Glucose, Bld 145 (*) 70 - 99 (mg/dL)   BUN 17  6  - 23 (mg/dL)   Creatinine, Ser 7.82  0.50 - 1.10 (mg/dL)  Calcium 9.4  8.4 - 10.5 (mg/dL)   GFR calc non Af Amer 50 (*) >90 (mL/min)   GFR calc Af Amer 59 (*) >90 (mL/min)  PRO B NATRIURETIC PEPTIDE      Component Value Range   Pro B Natriuretic peptide (BNP) 1384.0 (*) 0 - 450 (pg/mL)  TROPONIN I      Component Value Range   Troponin I <0.30  <0.30 (ng/mL)  PROTIME-INR      Component Value Range   Prothrombin Time 19.7 (*) 11.6 - 15.2 (seconds)   INR 1.64 (*) 0.00 - 1.49    Dg Chest 2 View  05/20/2011  *RADIOLOGY REPORT*  Clinical Data: Cough and fever  CHEST - 2 VIEW  Comparison: 10/09/2010  Findings: Heart size appears enlarged.  There is no pleural effusion or pulmonary edema.  Diffuse interstitial reticulation and peripheral honeycombing is again noted consistent with pulmonary fibrosis.  No superimposed airspace consolidation.  The visualized osseous structures appear normal.  IMPRESSION:  1.  No acute cardiopulmonary abnormalities.  Original Report Authenticated By: Rosealee Albee, M.D.      No diagnosis found. While in the emergency department, her heart rate dropped to about 100. The patient and was observed in the emergency department and although her heart rate would vary it mainly stayed in the range of 90-100. She is ambulated and oxygen saturation did not drop below 92%. At this point, I do not see any indication for hospitalization. There is no evidence of pneumonia. She will be discharged with  a prescription for iron and albuterol. I have discussed possible followup with a pulmonologist with the patient and her family.   MDM  Respiratory tract infection superimposed on pulmonary fibrosis, rule out pneumonia. Atrial fibrillation with poorly controlled ventricular rate.        Dione Booze, MD 05/20/11 [phone]

## 2011-05-20 NOTE — ED Notes (Signed)
Pt transported to ED via ambulance.  Complaining of cold for 4 weeks, shortness of breath for 1 week.  Positive cough, white frothy sputum.  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  History of pulmonary fibrosis.  Received 2 breathing treatments in ambulance, 1 duoneb, 1 solumedrol 125 mg.  Wheezing Bilateral upper lobes heard in ambulance.  82% room air.  2L nasal cannula placed.  02 saturation increased with breathing treatments.  Albesa Seen E 1:16 PM 05/20/2011

## 2011-05-21 ENCOUNTER — Ambulatory Visit: Payer: Medicare Other | Admitting: Internal Medicine

## 2011-05-24 ENCOUNTER — Encounter: Payer: Self-pay | Admitting: Internal Medicine

## 2011-05-25 ENCOUNTER — Ambulatory Visit (INDEPENDENT_AMBULATORY_CARE_PROVIDER_SITE_OTHER): Payer: Medicare Other | Admitting: Internal Medicine

## 2011-05-25 ENCOUNTER — Encounter: Payer: Self-pay | Admitting: Internal Medicine

## 2011-05-25 DIAGNOSIS — E876 Hypokalemia: Secondary | ICD-10-CM

## 2011-05-25 DIAGNOSIS — J209 Acute bronchitis, unspecified: Secondary | ICD-10-CM

## 2011-05-25 DIAGNOSIS — I4891 Unspecified atrial fibrillation: Secondary | ICD-10-CM

## 2011-05-25 DIAGNOSIS — J8410 Pulmonary fibrosis, unspecified: Secondary | ICD-10-CM

## 2011-05-25 DIAGNOSIS — Z7901 Long term (current) use of anticoagulants: Secondary | ICD-10-CM

## 2011-05-25 DIAGNOSIS — R7309 Other abnormal glucose: Secondary | ICD-10-CM

## 2011-05-25 LAB — POCT INR: INR: 3.2

## 2011-05-25 MED ORDER — HYDROCODONE-HOMATROPINE 5-1.5 MG/5ML PO SYRP: 5.0000 mL | ORAL_SOLUTION | Freq: Four times a day (QID) | ORAL | Status: DC | PRN

## 2011-05-25 MED ORDER — PREDNISONE 20 MG PO TABS: 20.0000 mg | ORAL_TABLET | ORAL | Status: AC

## 2011-05-25 MED ORDER — POTASSIUM CHLORIDE CRYS ER 20 MEQ PO TBCR: 20.0000 meq | EXTENDED_RELEASE_TABLET | Freq: Every day | ORAL | Status: DC

## 2011-05-25 MED ORDER — AZITHROMYCIN 250 MG PO TABS: ORAL_TABLET | ORAL | Status: AC

## 2011-05-25 NOTE — Progress Notes (Signed)
  Subjective:    Patient ID: [name], female    DOB: 12-20-1931, 77 y.o.   MRN: [id number]  HPI She was seen at urgent care 12/16 with dyspnea and cough productive of white sputum. O2 sats on room air were 82% prompting transfer to the emergency room. She was given IV Solu-Medrol en route. In the emergency room she received received a Combivent treatment. At the time of discharge her O2 sats were 90%. She was discharged on prednisone 50 mg daily x5 days. She was also told to use her albuterol 2 puffs every 4 hours. Advair was discontinued.  Emergency room records were reviewed. Her potassium was 3.2; she was placed on supplements. Her her glucose was 145. White count was normal at 8200.  Chest x-ray revealed stable interstitial and honeycomb changes of pulmonary fibrosis.    Review of Systems she denies fever, chills, sweats or purulent secretions. She has thick white sputum. She has had some yellow from her nose but not now. She continues to have facial pain but not frontal headaches.    Objective:   Physical Exam General appearancegood nourishment; no acute distress but some increased work of breathing is present.  No  lymphadenopathy about the head, neck, or axilla noted.   Eyes: No conjunctival inflammation or lid edema is present.   Ears:  External ear exam shows no significant lesions or deformities.  Otoscopic examination reveals clear canals, tympanic membranes are intact bilaterally without bulging, retraction, inflammation or discharge.  Nose:  External nasal examination shows no deformity or inflammation. Nasal mucosa are pink and moist without lesions or exudates. No septal dislocation .No obstruction to airflow.   Oral exam: Dental hygiene is good; lips and gums are healthy appearing.There is no oropharyngeal erythema or exudate noted.     Heart:  Normal rate and regular rhythm. S1 and S2 normal without gallop, murmur, click, rub or other extra sounds.  Heart sounds  distant; some respiratory variation  Lungs: She exhibits diffuse, essentially homogenous, juicy rales in all lung fields. Extremities:  No cyanosis, edema, or clubbing  noted    Skin: Warm & dry           Assessment & Plan:

## 2011-05-25 NOTE — Patient Instructions (Addendum)
Prednisone twice a day with meals for 7 days; then once daily for 7 days with a meal; then one half daily until completed. No Coumadin today; resume present schedule. Check the PT/INR with the potassium level  in 5 days. The antibiotic may drive the PT/ INR  Up. Advair one inhalation every 12 hours; gargle and spit after use

## 2011-05-25 NOTE — Assessment & Plan Note (Signed)
She has had an apparent acute bronchitis with compromise of her underlying pulmonary fibrosis manifested by oxygen desaturation to the low 80s. She will be treated for atypical bacteria. Steroids will be reinitiated and weaned over 10-14 days.  She'll be asked to resume the Advair every 12 hours.

## 2011-05-30 ENCOUNTER — Telehealth: Payer: Self-pay | Admitting: Internal Medicine

## 2011-05-30 MED ORDER — FLUTICASONE-SALMETEROL 100-50 MCG/DOSE IN AEPB: 1.0000 | INHALATION_SPRAY | Freq: Two times a day (BID) | RESPIRATORY_TRACT | Status: DC

## 2011-05-30 NOTE — Telephone Encounter (Signed)
Patient needs refill advair discus -[facility]

## 2011-05-30 NOTE — Telephone Encounter (Signed)
RX sent

## 2011-05-31 ENCOUNTER — Ambulatory Visit (INDEPENDENT_AMBULATORY_CARE_PROVIDER_SITE_OTHER): Payer: Medicare Other | Admitting: *Deleted

## 2011-05-31 DIAGNOSIS — Z7901 Long term (current) use of anticoagulants: Secondary | ICD-10-CM

## 2011-05-31 DIAGNOSIS — Z5181 Encounter for therapeutic drug level monitoring: Secondary | ICD-10-CM

## 2011-05-31 MED ORDER — FLUTICASONE-SALMETEROL 100-50 MCG/DOSE IN AEPB: 1.0000 | INHALATION_SPRAY | Freq: Two times a day (BID) | RESPIRATORY_TRACT | Status: DC

## 2011-05-31 NOTE — Patient Instructions (Addendum)
Change: Take 2.5 mg today 12.27.12; Then take 5 mg daily, except Saturday 7.5mg  daily; total 41.25 mg weekly. Return in [2] weeks for INR/PT.

## 2011-06-04 ENCOUNTER — Telehealth: Payer: Self-pay

## 2011-06-04 NOTE — Telephone Encounter (Signed)
Called and wanted to review her INR directions.     I tried contacting patient .Marland KitchenMarland KitchenMessage left for patient to return my call.     KP

## 2011-06-06 NOTE — Telephone Encounter (Signed)
Spoke with patient  Change: Take 2.5 mg today 12.27.12; Then take 5 mg daily, except Saturday 7.5mg  daily; total 41.25 mg weekly. Return in [2] weeks for INR/PT  Patient ok'd instruction and stated the part where it say"Except Sat 7.5mg  daily" had her confused, confusion was cleared with phone call.

## 2011-06-14 ENCOUNTER — Ambulatory Visit: Payer: Medicare Other

## 2011-06-22 ENCOUNTER — Ambulatory Visit: Payer: Medicare Other

## 2011-06-29 ENCOUNTER — Ambulatory Visit: Payer: Medicare Other

## 2011-06-29 ENCOUNTER — Telehealth: Payer: Self-pay

## 2011-06-29 ENCOUNTER — Ambulatory Visit (INDEPENDENT_AMBULATORY_CARE_PROVIDER_SITE_OTHER): Payer: Medicare Other | Admitting: *Deleted

## 2011-06-29 DIAGNOSIS — I4891 Unspecified atrial fibrillation: Secondary | ICD-10-CM

## 2011-06-29 DIAGNOSIS — Z7901 Long term (current) use of anticoagulants: Secondary | ICD-10-CM

## 2011-06-29 LAB — POCT INR: INR: 2.6

## 2011-06-29 NOTE — Telephone Encounter (Signed)
Patient was here for a PT/INR and noted she has a red, slightly raised area on her buttocks that increases in pain when she sits down. I offered patient to be seen today and she refused and stated she has to be at work at 12:00pm and will monitor and call next week for appointment if no improvement. Patient mentions this started when she was taking prednisone and she is not certain if it is related to her taking prednisone or not.   I then offered again for patient to be seen, patient refused again. Patient instructed to use warm/cold compresses and ok to take tylenol in very small amount. Unable to take anti-inflammatories due to blood thinner. Patient will call for appointment if needed, emergency room or urgent care if increase in pain or symptoms change for the worse. Patient agreed

## 2011-06-29 NOTE — Patient Instructions (Signed)
Return to office in 4 weeks   Continue current dose:  take 5 mg daily

## 2011-07-12 ENCOUNTER — Telehealth: Payer: Self-pay | Admitting: Internal Medicine

## 2011-07-12 NOTE — Telephone Encounter (Signed)
Spoke with patient and she states she has pulmonary fibrosis and was placed on Prednisone taper. During the time she was on Prednisone, she had a sore come up on her buttock cheek. It is not going away. She wants to know if she should see her PCP or Dr. Juanda Chance for this. Per patient the sore is not at the rectum but her "butt cheek". Patient instructed to call her PCP for this.

## 2011-07-12 NOTE — Telephone Encounter (Signed)
I agree. ?? Steroid induced acne?

## 2011-07-24 ENCOUNTER — Ambulatory Visit (INDEPENDENT_AMBULATORY_CARE_PROVIDER_SITE_OTHER): Payer: Medicare Other | Admitting: Internal Medicine

## 2011-07-24 DIAGNOSIS — J069 Acute upper respiratory infection, unspecified: Secondary | ICD-10-CM

## 2011-07-24 DIAGNOSIS — J209 Acute bronchitis, unspecified: Secondary | ICD-10-CM

## 2011-07-24 DIAGNOSIS — Z7901 Long term (current) use of anticoagulants: Secondary | ICD-10-CM

## 2011-07-24 DIAGNOSIS — I4891 Unspecified atrial fibrillation: Secondary | ICD-10-CM

## 2011-07-24 DIAGNOSIS — L8990 Pressure ulcer of unspecified site, unspecified stage: Secondary | ICD-10-CM

## 2011-07-24 MED ORDER — AZITHROMYCIN 250 MG PO TABS: ORAL_TABLET | ORAL | Status: AC

## 2011-07-24 NOTE — Progress Notes (Signed)
  Subjective:    Patient ID: [name], female    DOB: 1932-02-09, 78 y.o.   MRN: [id number]  HPI Respiratory tract infection Onset/symptoms:10 days ago as head congestion Exposures (illness/environmental/extrinsic):husband ill Progression of symptoms:to chest congestion Treatments/response:MDIs, Rx cough med with benefit Present symptoms: Fever/chills/sweats:no Frontal headache:no Facial pain:initially only Nasal purulence:initially , now white & frothy Sore throat:not now Dental pain:no Lymphadenopathy:no Wheezing/shortness of breath:yes but better today Cough/sputum/hemoptysis:white & frothy Pleuritic pain:no Associated extrinsic/allergic symptoms:itchy eyes/ sneezing:no Past medical history: Pulmonary Fibrosis & asthmatic bronchitis FH: mother had asthma Smoking history:quit 1987           Review of Systems She is due for PT/INR monitored. She's had no abnormal bruising or bleeding. With the present illness she has not noted any tachycardia or paroxysmal nocturnal dyspnea. After a high-dose steroids 4 weeks ago; she developed a sore of  right buttock. This was treated with zinc oxide and topical antibiotic ointment.    Objective:   Physical Exam General appearance:good health ;well nourished; no acute distress or increased work of breathing is present.  No  lymphadenopathy about the head, neck, or axilla noted.   Eyes: No conjunctival inflammation or lid edema is present.   Ears:  External ear exam shows no significant lesions or deformities.  Otoscopic examination reveals clear canals, tympanic membranes are intact bilaterally without bulging, retraction, inflammation or discharge.  Nose:  External nasal examination shows no deformity or inflammation. Nasal mucosa are dry without lesions or exudates. No septal dislocation or deviation.No obstruction to airflow.   Oral exam: Dental hygiene is good; lips and gums are healthy appearing.There is no oropharyngeal  erythema or exudate noted.      Heart:  Normal rate and regular rhythm. S1 and S2 normal without gallop, murmur, click, rub or other extra sounds. Heart sounds are somewhat distant  Lungs: She has diffuse, homogenous, juicy rales greatest at the bases.No increased work of breathing. As noted oxygen saturations are excellent  Extremities:  No cyanosis, edema, or clubbing  noted    Skin: Warm & dry ; she has a shallow decubitus of the right buttock which is healing. There is excellent granulation tissue at the base. There is no purulence or discharge          Assessment & Plan:  #1 upper respiratory infection without definite sinusitis #2 bronchitis , acute w/o bronchospasm but abnormal breath sounds related to her underlying pulmonary fibrosis. There is no respiratory compromise. The character of the sputum suggests an atypical organism.  #3 anticoagulation monitor; antibiotic choice certainly may impact her PT/INR Plan: See orders and recommendations

## 2011-07-24 NOTE — Patient Instructions (Addendum)
Dip gauze in  sterile saline and apply to the wound twice a day. Cover the wound with Telfa , non stick dressing  without any antibiotic ointment. The saline can be purchased at the drugstore or you can make your own .Boil cup of salt in a gallon of water. Store mixture  in a clean container.Report Warning  signs as discussed (red streaks, pus, fever, increasing pain).  Please employ a rubber donut to keep pressure off the decubitus ulcer. PT/INR is therapeutic. No change in warfarin dose; repeat PT/INR in 4 weeks

## 2011-07-31 ENCOUNTER — Other Ambulatory Visit: Payer: Self-pay | Admitting: Internal Medicine

## 2011-09-01 ENCOUNTER — Other Ambulatory Visit: Payer: Self-pay | Admitting: Internal Medicine

## 2011-09-11 ENCOUNTER — Ambulatory Visit (INDEPENDENT_AMBULATORY_CARE_PROVIDER_SITE_OTHER): Payer: Medicare Other | Admitting: *Deleted

## 2011-09-11 VITALS — BP 130/68 | HR 56 | Temp 97.7°F | Wt 175.0 lb

## 2011-09-11 DIAGNOSIS — I4891 Unspecified atrial fibrillation: Secondary | ICD-10-CM

## 2011-09-11 DIAGNOSIS — Z7901 Long term (current) use of anticoagulants: Secondary | ICD-10-CM

## 2011-09-11 NOTE — Patient Instructions (Addendum)
Return to office in 3 weeks  New dosing: 7.5mg  (1 1/2 tab) today and tomorrow then resume 5 mg (1 tab) daily  Please make sure that you are taking coumadin as indicated and that you follow-up at the appropriate date to ensure that INR/PT are being monitor. If unable to come in at schedule time will need to be referred to coumadin clinic.

## 2011-10-02 ENCOUNTER — Other Ambulatory Visit: Payer: Self-pay | Admitting: Internal Medicine

## 2011-10-02 NOTE — Telephone Encounter (Signed)
Refill done.  

## 2011-10-04 ENCOUNTER — Ambulatory Visit (INDEPENDENT_AMBULATORY_CARE_PROVIDER_SITE_OTHER): Payer: Medicare Other | Admitting: *Deleted

## 2011-10-04 ENCOUNTER — Other Ambulatory Visit: Payer: Self-pay | Admitting: Internal Medicine

## 2011-10-04 VITALS — BP 130/68 | HR 64 | Temp 97.9°F | Wt 178.0 lb

## 2011-10-04 DIAGNOSIS — Z7901 Long term (current) use of anticoagulants: Secondary | ICD-10-CM

## 2011-10-04 DIAGNOSIS — I4891 Unspecified atrial fibrillation: Secondary | ICD-10-CM

## 2011-10-04 LAB — POCT INR: INR: 2.9

## 2011-10-04 NOTE — Patient Instructions (Signed)
Return to office in 4 weeks  Continue current dose:  2.5 mg today then 5 mg daily except 2.5 mg on wed

## 2011-10-26 ENCOUNTER — Ambulatory Visit (INDEPENDENT_AMBULATORY_CARE_PROVIDER_SITE_OTHER): Payer: Medicare Other | Admitting: *Deleted

## 2011-10-26 ENCOUNTER — Encounter: Payer: Self-pay | Admitting: *Deleted

## 2011-10-26 DIAGNOSIS — I4891 Unspecified atrial fibrillation: Secondary | ICD-10-CM

## 2011-10-26 DIAGNOSIS — Z7901 Long term (current) use of anticoagulants: Secondary | ICD-10-CM

## 2011-10-26 NOTE — Patient Instructions (Signed)
PT/INR today 1.8 Take 7.5mg  today and then continue 5MG  daily Re-check in 4 weeks (10-26-11)

## 2011-11-01 ENCOUNTER — Ambulatory Visit: Payer: Medicare Other

## 2011-12-04 ENCOUNTER — Ambulatory Visit (INDEPENDENT_AMBULATORY_CARE_PROVIDER_SITE_OTHER): Payer: Medicare Other | Admitting: *Deleted

## 2011-12-04 VITALS — BP 122/60 | HR 67 | Temp 98.3°F | Wt 174.0 lb

## 2011-12-04 DIAGNOSIS — I4891 Unspecified atrial fibrillation: Secondary | ICD-10-CM

## 2011-12-04 DIAGNOSIS — Z7901 Long term (current) use of anticoagulants: Secondary | ICD-10-CM

## 2011-12-04 LAB — POCT INR: INR: 2.5

## 2011-12-04 NOTE — Patient Instructions (Signed)
Return to office in 4 weeks  Continue current dose: 5 mg daily 

## 2012-01-03 ENCOUNTER — Ambulatory Visit (INDEPENDENT_AMBULATORY_CARE_PROVIDER_SITE_OTHER): Payer: Medicare Other | Admitting: *Deleted

## 2012-01-03 VITALS — BP 130/68 | HR 68 | Wt 171.0 lb

## 2012-01-03 DIAGNOSIS — Z7901 Long term (current) use of anticoagulants: Secondary | ICD-10-CM

## 2012-01-03 DIAGNOSIS — I4891 Unspecified atrial fibrillation: Secondary | ICD-10-CM

## 2012-01-03 LAB — POCT INR: INR: 1.4

## 2012-01-03 NOTE — Patient Instructions (Signed)
Return to office in 1 week  New dosing: 7.5 mg next 2 days then 5 mg daily

## 2012-01-10 ENCOUNTER — Ambulatory Visit (INDEPENDENT_AMBULATORY_CARE_PROVIDER_SITE_OTHER): Payer: Medicare Other | Admitting: *Deleted

## 2012-01-10 VITALS — BP 136/72 | HR 71

## 2012-01-10 DIAGNOSIS — Z7901 Long term (current) use of anticoagulants: Secondary | ICD-10-CM

## 2012-01-12 ENCOUNTER — Other Ambulatory Visit: Payer: Self-pay | Admitting: Internal Medicine

## 2012-01-20 ENCOUNTER — Other Ambulatory Visit: Payer: Self-pay | Admitting: Internal Medicine

## 2012-01-29 ENCOUNTER — Ambulatory Visit (INDEPENDENT_AMBULATORY_CARE_PROVIDER_SITE_OTHER): Payer: Medicare Other | Admitting: *Deleted

## 2012-01-29 VITALS — BP 110/60 | HR 62 | Wt 171.0 lb

## 2012-01-29 DIAGNOSIS — I4891 Unspecified atrial fibrillation: Secondary | ICD-10-CM

## 2012-01-29 DIAGNOSIS — Z7901 Long term (current) use of anticoagulants: Secondary | ICD-10-CM

## 2012-01-29 LAB — POCT INR: INR: 2.5

## 2012-01-29 NOTE — Patient Instructions (Signed)
Return to office in 4 weeks  Continue current dose: 5 mg daily 

## 2012-02-02 ENCOUNTER — Other Ambulatory Visit: Payer: Self-pay | Admitting: Internal Medicine

## 2012-02-26 ENCOUNTER — Other Ambulatory Visit: Payer: Self-pay | Admitting: Internal Medicine

## 2012-02-26 ENCOUNTER — Encounter: Payer: Self-pay | Admitting: *Deleted

## 2012-02-26 ENCOUNTER — Ambulatory Visit (INDEPENDENT_AMBULATORY_CARE_PROVIDER_SITE_OTHER): Payer: Medicare Other | Admitting: *Deleted

## 2012-02-26 VITALS — BP 105/50 | HR 56 | Temp 97.7°F | Wt 170.6 lb

## 2012-02-26 DIAGNOSIS — Z7901 Long term (current) use of anticoagulants: Secondary | ICD-10-CM

## 2012-02-26 LAB — POCT INR: INR: 4

## 2012-02-26 NOTE — Patient Instructions (Addendum)
Today PT/INR 4.0 Hold dosage today 02-26-12 Take 2.5 mg on Tuesdays and Thursdays Take 5mg  all other days Keep records of Blood Pressure and pulse Re-check in one week, bring records of BP/Pulse with you to PT/INR check next week

## 2012-03-04 ENCOUNTER — Ambulatory Visit (INDEPENDENT_AMBULATORY_CARE_PROVIDER_SITE_OTHER): Payer: Medicare Other

## 2012-03-04 VITALS — BP 124/70 | HR 55 | Wt 170.4 lb

## 2012-03-04 DIAGNOSIS — I4891 Unspecified atrial fibrillation: Secondary | ICD-10-CM

## 2012-03-04 DIAGNOSIS — Z7901 Long term (current) use of anticoagulants: Secondary | ICD-10-CM

## 2012-03-04 NOTE — Patient Instructions (Addendum)
Per Dr.Hopper continue with regular regimen 5 mg daily and recheck in 3 weeks

## 2012-03-29 ENCOUNTER — Other Ambulatory Visit: Payer: Self-pay | Admitting: Internal Medicine

## 2012-04-15 ENCOUNTER — Ambulatory Visit (INDEPENDENT_AMBULATORY_CARE_PROVIDER_SITE_OTHER): Payer: Medicare Other

## 2012-04-15 VITALS — BP 130/70 | HR 76 | Wt 170.4 lb

## 2012-04-15 DIAGNOSIS — Z7901 Long term (current) use of anticoagulants: Secondary | ICD-10-CM

## 2012-04-15 DIAGNOSIS — I4891 Unspecified atrial fibrillation: Secondary | ICD-10-CM

## 2012-04-15 DIAGNOSIS — Z23 Encounter for immunization: Secondary | ICD-10-CM

## 2012-04-15 LAB — POCT INR: INR: 2.8

## 2012-04-15 MED ORDER — WARFARIN SODIUM 5 MG PO TABS: ORAL_TABLET | ORAL | Status: DC

## 2012-04-15 NOTE — Patient Instructions (Signed)
Per MD continue 5 mg daily and recheck in 4 weeks

## 2012-04-30 ENCOUNTER — Other Ambulatory Visit: Payer: Self-pay | Admitting: Internal Medicine

## 2012-05-21 ENCOUNTER — Ambulatory Visit (INDEPENDENT_AMBULATORY_CARE_PROVIDER_SITE_OTHER): Payer: Medicare Other

## 2012-05-21 VITALS — BP 132/80 | HR 69 | Wt 170.4 lb

## 2012-05-21 DIAGNOSIS — I4891 Unspecified atrial fibrillation: Secondary | ICD-10-CM

## 2012-05-21 DIAGNOSIS — Z79899 Other long term (current) drug therapy: Secondary | ICD-10-CM

## 2012-05-21 LAB — POCT INR: INR: 2

## 2012-05-21 NOTE — Patient Instructions (Signed)
No change in doses, 5 mg daily and recheck in 4 weeks

## 2012-06-18 ENCOUNTER — Ambulatory Visit (INDEPENDENT_AMBULATORY_CARE_PROVIDER_SITE_OTHER): Payer: Medicare Other | Admitting: *Deleted

## 2012-06-18 VITALS — BP 120/72 | HR 76 | Wt 173.0 lb

## 2012-06-18 DIAGNOSIS — I4891 Unspecified atrial fibrillation: Secondary | ICD-10-CM

## 2012-06-18 DIAGNOSIS — Z7901 Long term (current) use of anticoagulants: Secondary | ICD-10-CM

## 2012-06-18 NOTE — Patient Instructions (Signed)
Return to office in 4 weeks  New dosing: Hold today then 5 mg daily except 2.5 mg on wed

## 2012-06-19 ENCOUNTER — Encounter: Payer: Self-pay | Admitting: Internal Medicine

## 2012-07-19 ENCOUNTER — Other Ambulatory Visit: Payer: Self-pay | Admitting: Internal Medicine

## 2012-07-22 NOTE — Telephone Encounter (Signed)
Patient needs to schedule a CPX  

## 2012-07-31 ENCOUNTER — Ambulatory Visit: Payer: Medicare Other

## 2012-08-08 ENCOUNTER — Ambulatory Visit: Payer: Medicare Other

## 2012-08-13 ENCOUNTER — Ambulatory Visit (INDEPENDENT_AMBULATORY_CARE_PROVIDER_SITE_OTHER): Payer: Medicare Other

## 2012-08-13 VITALS — BP 118/70 | HR 74 | Wt 168.0 lb

## 2012-08-13 DIAGNOSIS — I4891 Unspecified atrial fibrillation: Secondary | ICD-10-CM

## 2012-08-13 NOTE — Patient Instructions (Signed)
Continue 5 mg daily EXCEPT 2.5 mg on Wed RECHECK in 4 weeks

## 2012-09-18 ENCOUNTER — Ambulatory Visit: Payer: Medicare Other

## 2012-10-02 ENCOUNTER — Ambulatory Visit (INDEPENDENT_AMBULATORY_CARE_PROVIDER_SITE_OTHER): Payer: Medicare Other | Admitting: *Deleted

## 2012-10-02 ENCOUNTER — Other Ambulatory Visit: Payer: Self-pay | Admitting: Internal Medicine

## 2012-10-02 VITALS — HR 71 | Wt 166.0 lb

## 2012-10-02 DIAGNOSIS — I4891 Unspecified atrial fibrillation: Secondary | ICD-10-CM

## 2012-10-02 DIAGNOSIS — Z7901 Long term (current) use of anticoagulants: Secondary | ICD-10-CM

## 2012-10-02 NOTE — Patient Instructions (Signed)
Return to office in 2 weeks  New dosing: 7.5 mg today then take 5 mg daily

## 2012-10-17 ENCOUNTER — Ambulatory Visit (INDEPENDENT_AMBULATORY_CARE_PROVIDER_SITE_OTHER): Payer: Medicare Other | Admitting: *Deleted

## 2012-10-17 VITALS — BP 118/80 | HR 78 | Temp 98.0°F | Resp 16 | Wt 164.1 lb

## 2012-10-17 DIAGNOSIS — Z7901 Long term (current) use of anticoagulants: Secondary | ICD-10-CM

## 2012-10-17 DIAGNOSIS — I4891 Unspecified atrial fibrillation: Secondary | ICD-10-CM

## 2012-10-17 LAB — POCT INR: INR: 4

## 2012-10-17 NOTE — Patient Instructions (Signed)
New Dose: 5mg  daily  Return In: 2 weeks

## 2012-10-25 ENCOUNTER — Other Ambulatory Visit: Payer: Self-pay | Admitting: Internal Medicine

## 2012-10-30 ENCOUNTER — Encounter: Payer: Self-pay | Admitting: Internal Medicine

## 2012-10-30 ENCOUNTER — Ambulatory Visit (INDEPENDENT_AMBULATORY_CARE_PROVIDER_SITE_OTHER): Payer: Medicare Other | Admitting: Internal Medicine

## 2012-10-30 ENCOUNTER — Encounter: Payer: Self-pay | Admitting: *Deleted

## 2012-10-30 VITALS — BP 140/78 | HR 73 | Ht 66.0 in | Wt 169.4 lb

## 2012-10-30 DIAGNOSIS — E876 Hypokalemia: Secondary | ICD-10-CM

## 2012-10-30 DIAGNOSIS — I4891 Unspecified atrial fibrillation: Secondary | ICD-10-CM

## 2012-10-30 DIAGNOSIS — R0609 Other forms of dyspnea: Secondary | ICD-10-CM

## 2012-10-30 LAB — CBC WITH DIFFERENTIAL/PLATELET
Basophils Relative: 1.2 % (ref 0.0–3.0)
Eosinophils Relative: 6 % — ABNORMAL HIGH (ref 0.0–5.0)
Lymphocytes Relative: 30.9 % (ref 12.0–46.0)
Monocytes Relative: 9.3 % (ref 3.0–12.0)
Neutrophils Relative %: 52.6 % (ref 43.0–77.0)
Platelets: 214 10*3/uL (ref 150.0–400.0)
RBC: 4.31 Mil/uL (ref 3.87–5.11)
WBC: 9.6 10*3/uL (ref 4.5–10.5)

## 2012-10-30 LAB — BASIC METABOLIC PANEL
Calcium: 9 mg/dL (ref 8.4–10.5)
Chloride: 105 mEq/L (ref 96–112)
Creatinine, Ser: 1.2 mg/dL (ref 0.4–1.2)
GFR: 46.52 mL/min — ABNORMAL LOW (ref >60.00)

## 2012-10-30 MED ORDER — FUROSEMIDE 20 MG PO TABS: 20.0000 mg | ORAL_TABLET | Freq: Every day | ORAL | Status: DC

## 2012-10-30 MED ORDER — POTASSIUM CHLORIDE CRYS ER 20 MEQ PO TBCR: 20.0000 meq | EXTENDED_RELEASE_TABLET | Freq: Every day | ORAL | Status: DC

## 2012-10-30 NOTE — Assessment & Plan Note (Signed)
As above.

## 2012-10-30 NOTE — Patient Instructions (Addendum)
Your physician has recommended you make the following change in your medication:  1) Start lasix (furosemide) 20 mg one tablet by mouth daily.  Your physician recommends that you have lab work today: bmp  Your physician has requested that you have an echocardiogram. Echocardiography is a painless test that uses sound waves to create images of your heart. It provides your doctor with information about the size and shape of your heart and how well your heart's chambers and valves are working. This procedure takes approximately one hour. There are no restrictions for this procedure.   Your physician has recommended that you have a Cardioversion (DCCV)- about 7 days after your echocardiogram is completed. Electrical Cardioversion uses a jolt of electricity to your heart either through paddles or wired patches attached to your chest. This is a controlled, usually prescheduled, procedure. Defibrillation is done under light anesthesia in the hospital, and you usually go home the day of the procedure. This is done to get your heart back into a normal rhythm. You are not awake for the procedure. Please see the instruction sheet given to you today.  Your physician recommends that you schedule a follow-up appointment in: 4 weeks with Dr. [name].

## 2012-10-30 NOTE — Assessment & Plan Note (Signed)
The patient has persistent atrial fibrillation of unknown duration. Her anticoagulation has been variable but above 2. Is not clear  Whether her atrial fibrillation or her pulmonary fibrosis is contributing to her worsening symptoms of shortness of breath and fatigue but is it seems an  easy decision to try to cardiovert her and see what impact we can have on her symptoms. We will undertake an echo to look at left atrial dimensions to help [place] decide as to whether it is likely that she will maintain sinus rhythm without antiarrhythmic support and what options we would happen this regard related to left ventricular function.  In the event that her symptoms do not improve following restoration of sinus rhythm it would likely be that this is related to her pulmonary fibrosis and rate control as a strategy would be reasonable. If we were to pursue that strategy, would undertake Holter monitoring to look for heart rate excursion with activity as dyspnea with exertion could possibly related to inadequate control of heart rate with activity.

## 2012-10-30 NOTE — Progress Notes (Signed)
Patient Care Team: Pecola Lawless, MD as PCP - General   HPI  [name] is a 79 y.o. female seen Followup of recurrent symptomatic paroxysmal atrial fibrillation. This dates back a number of years.  She has had intermittent problems with irregular palpitations. She is unaware of atrial fibrillation today. Her ECG confirms the presence of atrial fibrillation. sHe is having more problems with shortness of breath and some peripheral edema. sHe was previously on a diuretic which has been discontinued  She is on anticoagulation.  She also has a history of pulmonary fibrosis.  she is  Current Outpatient Prescriptions      Past Medical History  Diagnosis Date  . Obstructive sleep apnea     CPAP Intolerant  . Rheumatoid arthritis(714.0)     Dr.Truslow  . Cystitis 1974  . Thyroid disease     HYPOTHROIDISM  . DJD (degenerative joint disease)   . Idiopathic pulmonary fibrosis     IDIOPATHIC vs CHRONIC ERD  . Hyperlipidemia 2005    NMR LIPOPROFILE: LDL 144 (1570/732), HDL 61, TG 103., LDL GOAL = < 130  . Atrial fibrillation     Paroxysmal  . Left bundle branch block   . Osteoporosis     Past Surgical History  Procedure Laterality Date  . Abdominal hysterectomy      Abnormal pap smear; ovaries remaining  . Cholecystectomy  1966  . Appendectomy  1966  . Cystoscopy      Hematuria   . Retinal detachment surgery      OD-both eyes    Current Outpatient Prescriptions  Medication Sig Dispense Refill  . acetaminophen (TYLENOL) 325 MG tablet Take 325 mg by mouth every 6 (six) hours as needed for pain.      Marland Kitchen diltiazem (CARDIZEM) 120 MG tablet TAKE ONE TABLET BY MOUTH EVERY DAY.  30 tablet  0  . Fluticasone-Salmeterol (ADVAIR) 100-50 MCG/DOSE AEPB Inhale 1 puff into the lungs as needed.      Marland Kitchen HYDROcodone-homatropine (HYCODAN) 5-1.5 MG/5ML syrup Take 5 mLs by mouth every 6 (six) hours as needed. For cough.  120 mL  0  . levothyroxine (SYNTHROID, LEVOTHROID) 50 MCG tablet  APPOINTMENT OVERDUE, 1 1/2 by mouth on M/W/F/Sun and 1 on T/TH/Sat  45 tablet  1  . warfarin (COUMADIN) 5 MG tablet Take once daily or as directed based on PT/INR reading  60 tablet  0  . warfarin (COUMADIN) 5 MG tablet TAKE AS DIRECTED  60 tablet  0  . warfarin (COUMADIN) 5 MG tablet TAKE BY MOUTH ONCE DAILY OR AS DIRECTED BASED ON PT/INR READING  60 tablet  0  . albuterol (PROVENTIL HFA;VENTOLIN HFA) 108 (90 BASE) MCG/ACT inhaler Inhale 2 puffs into the lungs every 4 (four) hours as needed for wheezing (or cough).  1 Inhaler  0  . potassium chloride SA (K-DUR,KLOR-CON) 20 MEQ tablet Take 1 tablet (20 mEq total) by mouth daily.  30 tablet  0   No current facility-administered medications for this visit.    Allergies  Allergen Reactions  . Amoxicillin     REACTION: upset stomach    Review of Systems negative except from HPI and PMH  Physical Exam BP 140/78  Pulse 73  Ht 5\' 6"  (1.676 m)  Wt 169 lb 6.4 oz (76.839 kg)  BMI 27.35 kg/m2 Well developed and well nourished in no acute distress HENT normal E scleral and icterus clear Neck Supple JVP 8-9 cm; carotids brisk and full Clear  Diffuse fine  crackles Irregularly irregular with no significant murmurs Soft with active bowel sounds No clubbing cyanosis 1+ and non-pitting  Edema Alert and oriented, grossly normal motor and sensory function Skin Warm and Dry  Atrial fibrillation at 73 -/17/43 Access  -47 Left bundle branch block  Assessment and  Plan

## 2012-10-31 ENCOUNTER — Ambulatory Visit (HOSPITAL_COMMUNITY): Payer: Medicare Other | Attending: Cardiology | Admitting: Radiology

## 2012-10-31 DIAGNOSIS — I4891 Unspecified atrial fibrillation: Secondary | ICD-10-CM | POA: Insufficient documentation

## 2012-10-31 DIAGNOSIS — R0609 Other forms of dyspnea: Secondary | ICD-10-CM | POA: Insufficient documentation

## 2012-10-31 DIAGNOSIS — R0989 Other specified symptoms and signs involving the circulatory and respiratory systems: Secondary | ICD-10-CM | POA: Insufficient documentation

## 2012-10-31 DIAGNOSIS — I447 Left bundle-branch block, unspecified: Secondary | ICD-10-CM

## 2012-10-31 NOTE — Progress Notes (Signed)
Echocardiogram performed.  

## 2012-11-05 ENCOUNTER — Ambulatory Visit (INDEPENDENT_AMBULATORY_CARE_PROVIDER_SITE_OTHER): Payer: Medicare Other | Admitting: General Practice

## 2012-11-05 VITALS — BP 118/78 | HR 68 | Temp 97.4°F | Resp 16 | Wt 164.0 lb

## 2012-11-05 DIAGNOSIS — I4891 Unspecified atrial fibrillation: Secondary | ICD-10-CM

## 2012-11-05 DIAGNOSIS — Z7901 Long term (current) use of anticoagulants: Secondary | ICD-10-CM

## 2012-11-05 NOTE — Patient Instructions (Addendum)
Return in:  4 weeks  New Dose: 2.5 Wednesday, and 5mg  daily

## 2012-11-07 ENCOUNTER — Ambulatory Visit (HOSPITAL_COMMUNITY)
Admission: RE | Admit: 2012-11-07 | Discharge: 2012-11-07 | Disposition: A | Discharge status: Home / Self Care | Payer: Medicare Other | Source: Ambulatory Visit | Attending: Cardiovascular Disease | Admitting: Cardiovascular Disease

## 2012-11-07 ENCOUNTER — Encounter (HOSPITAL_COMMUNITY): Payer: Self-pay | Admitting: *Deleted

## 2012-11-07 ENCOUNTER — Encounter (HOSPITAL_COMMUNITY): Payer: Self-pay | Admitting: Certified Registered Nurse Anesthetist

## 2012-11-07 ENCOUNTER — Ambulatory Visit (HOSPITAL_COMMUNITY): Payer: Medicare Other | Admitting: Certified Registered Nurse Anesthetist

## 2012-11-07 ENCOUNTER — Encounter (HOSPITAL_COMMUNITY)
Admission: RE | Disposition: A | Discharge status: Home / Self Care | Payer: Self-pay | Source: Ambulatory Visit | Attending: Cardiovascular Disease

## 2012-11-07 DIAGNOSIS — M069 Rheumatoid arthritis, unspecified: Secondary | ICD-10-CM | POA: Insufficient documentation

## 2012-11-07 DIAGNOSIS — Z79899 Other long term (current) drug therapy: Secondary | ICD-10-CM | POA: Insufficient documentation

## 2012-11-07 DIAGNOSIS — G4733 Obstructive sleep apnea (adult) (pediatric): Secondary | ICD-10-CM | POA: Insufficient documentation

## 2012-11-07 DIAGNOSIS — E876 Hypokalemia: Secondary | ICD-10-CM

## 2012-11-07 DIAGNOSIS — I447 Left bundle-branch block, unspecified: Secondary | ICD-10-CM | POA: Insufficient documentation

## 2012-11-07 DIAGNOSIS — J45909 Unspecified asthma, uncomplicated: Secondary | ICD-10-CM | POA: Insufficient documentation

## 2012-11-07 DIAGNOSIS — E039 Hypothyroidism, unspecified: Secondary | ICD-10-CM | POA: Insufficient documentation

## 2012-11-07 DIAGNOSIS — M1990 Unspecified osteoarthritis, unspecified site: Secondary | ICD-10-CM | POA: Insufficient documentation

## 2012-11-07 DIAGNOSIS — K219 Gastro-esophageal reflux disease without esophagitis: Secondary | ICD-10-CM | POA: Insufficient documentation

## 2012-11-07 DIAGNOSIS — Z7901 Long term (current) use of anticoagulants: Secondary | ICD-10-CM | POA: Insufficient documentation

## 2012-11-07 DIAGNOSIS — I4891 Unspecified atrial fibrillation: Secondary | ICD-10-CM

## 2012-11-07 DIAGNOSIS — M810 Age-related osteoporosis without current pathological fracture: Secondary | ICD-10-CM | POA: Insufficient documentation

## 2012-11-07 DIAGNOSIS — J8410 Pulmonary fibrosis, unspecified: Secondary | ICD-10-CM | POA: Insufficient documentation

## 2012-11-07 DIAGNOSIS — E785 Hyperlipidemia, unspecified: Secondary | ICD-10-CM | POA: Insufficient documentation

## 2012-11-07 DIAGNOSIS — IMO0002 Reserved for concepts with insufficient information to code with codable children: Secondary | ICD-10-CM | POA: Insufficient documentation

## 2012-11-07 HISTORY — PX: CARDIOVERSION: SHX1299

## 2012-11-07 LAB — BASIC METABOLIC PANEL
CO2: 24 mEq/L (ref 19–32)
Calcium: 9.4 mg/dL (ref 8.4–10.5)
Creatinine, Ser: 1.13 mg/dL — ABNORMAL HIGH (ref 0.50–1.10)
Glucose, Bld: 97 mg/dL (ref 70–99)

## 2012-11-07 SURGERY — CARDIOVERSION
Anesthesia: General

## 2012-11-07 MED ORDER — SODIUM CHLORIDE 0.9 % IV SOLN
INTRAVENOUS | Status: DC
  Administered 2012-11-07: 500 mL via INTRAVENOUS

## 2012-11-07 MED ORDER — PROPOFOL 10 MG/ML IV BOLUS
INTRAVENOUS | Status: DC | PRN
  Administered 2012-11-07: 40 mg via INTRAVENOUS

## 2012-11-07 NOTE — CV Procedure (Signed)
Electrical Cardioversion Procedure Note TYEISHA DINAN [id number] Feb 14, 1932  Procedure: Electrical Cardioversion Indications:  Atrial Fibrillation  Procedure Details Consent: Risks of procedure as well as the alternatives and risks of each were explained to the (patient/caregiver).  Consent for procedure obtained. Time Out: Verified patient identification, verified procedure, site/side was marked, verified correct patient position, special equipment/implants available, medications/allergies/relevent history reviewed, required imaging and test results available.  Performed  Patient placed on cardiac monitor, pulse oximetry, supplemental oxygen as necessary.  Sedation given: propofol Pacer pads placed anterior and posterior chest.  Cardioverted 1 time(s).  Cardioverted at 120J.  Evaluation Findings: Post procedure EKG shows: NSR Complications: None Patient did tolerate procedure well.   Cassell Clement 11/07/2012, 1:43 PM

## 2012-11-07 NOTE — Anesthesia Preprocedure Evaluation (Addendum)
Anesthesia Evaluation  Patient identified by MRN, date of birth, ID band Patient awake    Reviewed: Allergy & Precautions, H&P , NPO status , Patient's Chart, lab work & pertinent test results  Airway Mallampati: II TM Distance: >3 FB Neck ROM: Full    Dental  (+) Dental Advisory Given   Pulmonary shortness of breath and with exertion, asthma , sleep apnea ,          Cardiovascular + dysrhythmias Atrial Fibrillation     Neuro/Psych    GI/Hepatic GERD-  Medicated and Controlled,  Endo/Other  Hypothyroidism   Renal/GU      Musculoskeletal  (+) Arthritis -, Rheumatoid disorders,    Abdominal   Peds  Hematology   Anesthesia Other Findings   Reproductive/Obstetrics                           Anesthesia Physical Anesthesia Plan  ASA: III  Anesthesia Plan: General   Post-op Pain Management:    Induction: Intravenous  Airway Management Planned: Mask  Additional Equipment:   Intra-op Plan:   Post-operative Plan:   Informed Consent: I have reviewed the patients History and Physical, chart, labs and discussed the procedure including the risks, benefits and alternatives for the proposed anesthesia with the patient or authorized representative who has indicated his/her understanding and acceptance.   Dental advisory given  Plan Discussed with: CRNA, Anesthesiologist and Surgeon  Anesthesia Plan Comments:         Anesthesia Quick Evaluation

## 2012-11-07 NOTE — Preoperative (Signed)
Beta Blockers   Reason not to administer Beta Blockers:Not Applicable 

## 2012-11-07 NOTE — H&P (Signed)
[name] is an 79 y.o. female.   Chief Complaint: Atrial fibrillation HPI: This patient presents today for elective direct current cardioversion.  She was recently seen by Dr. [name] whose note is copied below. [name] is a 79 y.o. female  seen Followup of recurrent symptomatic paroxysmal atrial fibrillation. This dates back a number of years.  She has had intermittent problems with irregular palpitations. She is unaware of atrial fibrillation today. Her ECG confirms the presence of atrial fibrillation. sHe is having more problems with shortness of breath and some peripheral edema. sHe was previously on a diuretic which has been discontinued  She is on anticoagulation.  She also has a history of pulmonary fibrosis  Past Medical History  Diagnosis Date  . Obstructive sleep apnea     CPAP Intolerant  . Rheumatoid arthritis(714.0)     Dr.Truslow  . Cystitis 1974  . Thyroid disease     HYPOTHROIDISM  . DJD (degenerative joint disease)   . Idiopathic pulmonary fibrosis     IDIOPATHIC vs CHRONIC ERD  . Hyperlipidemia 2005    NMR LIPOPROFILE: LDL 144 (1570/732), HDL 61, TG 103., LDL GOAL = < 130  . Atrial fibrillation     Paroxysmal  . Left bundle branch block   . Osteoporosis   . Shortness of breath     Past Surgical History  Procedure Laterality Date  . Abdominal hysterectomy      Abnormal pap smear; ovaries remaining  . Cholecystectomy  1966  . Appendectomy  1966  . Cystoscopy      Hematuria     Family History  Problem Relation Age of Onset  . Coronary artery disease Father   . Transient ischemic attack Father   . Stroke Father     TIA  . Asthma Mother    Social History:  reports that she quit smoking about 27 years ago. She has never used smokeless tobacco. She reports that  drinks alcohol. She reports that she does not use illicit drugs.  Allergies:  Allergies  Allergen Reactions  . Amoxicillin     REACTION: upset stomach    Medications Prior to  Admission  Medication Sig Dispense Refill  . acetaminophen (TYLENOL) 325 MG tablet Take 325 mg by mouth every 6 (six) hours as needed for pain.      Marland Kitchen albuterol (PROVENTIL HFA;VENTOLIN HFA) 108 (90 BASE) MCG/ACT inhaler Inhale 2 puffs into the lungs every 4 (four) hours as needed for wheezing (or cough).      Marland Kitchen diltiazem (CARDIZEM) 120 MG tablet TAKE ONE TABLET BY MOUTH EVERY DAY.  30 tablet  0  . Fluticasone-Salmeterol (ADVAIR) 100-50 MCG/DOSE AEPB Inhale 1 puff into the lungs as needed.      . furosemide (LASIX) 20 MG tablet Take 1 tablet (20 mg total) by mouth daily.  30 tablet  6  . levothyroxine (SYNTHROID, LEVOTHROID) 50 MCG tablet APPOINTMENT OVERDUE, 1 1/2 by mouth on M/W/F/Sun and 1 on T/TH/Sat  45 tablet  1  . potassium chloride SA (K-DUR,KLOR-CON) 20 MEQ tablet Take 1 tablet (20 mEq total) by mouth daily.  30 tablet  6  . warfarin (COUMADIN) 5 MG tablet Take once daily or as directed based on PT/INR reading  60 tablet  0  . [DISCONTINUED] albuterol (PROVENTIL HFA;VENTOLIN HFA) 108 (90 BASE) MCG/ACT inhaler Inhale 2 puffs into the lungs every 4 (four) hours as needed for wheezing (or cough).  1 Inhaler  0  . HYDROcodone-homatropine (HYCODAN) 5-1.5 MG/5ML  syrup Take 5 mLs by mouth every 6 (six) hours as needed. For cough.  120 mL  0  . warfarin (COUMADIN) 5 MG tablet TAKE AS DIRECTED  60 tablet  0  . warfarin (COUMADIN) 5 MG tablet TAKE BY MOUTH ONCE DAILY OR AS DIRECTED BASED ON PT/INR READING  60 tablet  0    No results found for this or any previous visit (from the past 48 hour(s)). No results found.    There were no vitals taken for this visit. Well developed and well nourished in no acute distress  HENT normal  E scleral and icterus clear  Neck Supple  JVP 8-9 cm; carotids brisk and full  Clear Diffuse fine crackles  Irregularly irregular with no significant murmurs  Soft with active bowel sounds  No clubbing cyanosis 1+ and non-pitting Edema  Alert and oriented, grossly  normal motor and sensory function  Skin Warm and Dry   Assessment/Plan Dr. Odessa Fleming recent office note: The patient has persistent atrial fibrillation of unknown duration. Her anticoagulation has been variable but above 2. Is not clear Whether her atrial fibrillation or her pulmonary fibrosis is contributing to her worsening symptoms of shortness of breath and fatigue but is it seems an easy decision to try to cardiovert her and see what impact we can have on her symptoms. We will undertake an echo to look at left atrial dimensions to help [place] decide as to whether it is likely that she will maintain sinus rhythm without antiarrhythmic support and what options we would happen this regard related to left ventricular function.  In the event that her symptoms do not improve following restoration of sinus rhythm it would likely be that this is related to her pulmonary fibrosis and rate control as a strategy would be reasonable. If we were to pursue that strategy, would undertake Holter monitoring to look for heart rate excursion with activity as dyspnea with exertion could possibly related to inadequate control of heart rate with activity.  Her recent echocardiogram on 10/31/12 revealed: Study Conclusions  - Left ventricle: Diffuse hypokinesis with abnormal septal motion The cavity size was moderately dilated. There was mild focal basal hypertrophy of the septum. The estimated ejection fraction was 35%. - Aortic valve: Mild to moderate regurgitation. - Mitral valve: Mild to moderate regurgitation. - Left atrium: The atrium was moderately dilated. - Right atrium: The atrium was moderately dilated. - Atrial septum: No defect or patent foramen ovale was identified. - Pulmonary arteries: PA peak pressure: 48mm Hg (S).   Plan:  Proceed with elective DCCV today.   Cassell Clement 11/07/2012, 12:25 PM

## 2012-11-07 NOTE — Transfer of Care (Signed)
Immediate Anesthesia Transfer of Care Note  Patient: [name]  Procedure(s) Performed: Procedure(s): CARDIOVERSION (N/A)  Patient Location: Endoscopy Unit  Anesthesia Type:General  Level of Consciousness: awake, alert , oriented and patient cooperative  Airway & Oxygen Therapy: Patient Spontanous Breathing and Patient connected to nasal cannula oxygen  Post-op Assessment: Report given to PACU RN, Post -op Vital signs reviewed and stable and Patient moving all extremities X 4  Post vital signs: Reviewed and stable  Complications: No apparent anesthesia complications

## 2012-11-07 NOTE — Anesthesia Postprocedure Evaluation (Signed)
  Anesthesia Post-op Note  Patient: [name]  Procedure(s) Performed: Procedure(s): CARDIOVERSION (N/A)  Patient Location: PACU  Anesthesia Type:General  Level of Consciousness: awake, alert , oriented and patient cooperative  Airway and Oxygen Therapy: Patient Spontanous Breathing  Post-op Pain: none  Post-op Assessment: Post-op Vital signs reviewed, Patient's Cardiovascular Status Stable, Respiratory Function Stable, Patent Airway, No signs of Nausea or vomiting and Pain level controlled  Post-op Vital Signs: stable  Complications: No apparent anesthesia complications

## 2012-11-09 ENCOUNTER — Encounter (HOSPITAL_COMMUNITY): Payer: Self-pay | Admitting: Cardiology

## 2012-11-11 ENCOUNTER — Telehealth: Payer: Self-pay | Admitting: *Deleted

## 2012-11-11 DIAGNOSIS — I4891 Unspecified atrial fibrillation: Secondary | ICD-10-CM

## 2012-11-11 MED ORDER — LISINOPRIL 5 MG PO TABS: 5.0000 mg | ORAL_TABLET | Freq: Every day | ORAL | Status: DC

## 2012-11-11 MED ORDER — METOPROLOL SUCCINATE ER 50 MG PO TB24: 50.0000 mg | ORAL_TABLET | Freq: Every day | ORAL | Status: DC

## 2012-11-11 NOTE — Telephone Encounter (Signed)
Per Dr. [name]- Please Inform Patient that echo is abnormal; For now i would like to do three things: 1) stop dilt 2) begin metopr succ 50  3) begin lisinopr 5  With followup BMET and PA appt in 3 weeks or so    The patient is aware of her results. She verbalizes understanding of medication changes. She already has a f/u appointment with Dr. [name] on 6/27. Sherri Rad, RN, BSN

## 2012-11-12 ENCOUNTER — Telehealth: Payer: Self-pay | Admitting: *Deleted

## 2012-11-12 MED ORDER — LEVOTHYROXINE SODIUM 50 MCG PO TABS: ORAL_TABLET | ORAL | Status: DC

## 2012-11-12 NOTE — Telephone Encounter (Signed)
Rx sent, left Pt detail message to call to schedule lab appt.

## 2012-11-12 NOTE — Telephone Encounter (Signed)
#   30 ; schedule TSH ( 244.9)

## 2012-11-12 NOTE — Telephone Encounter (Signed)
Last OV 07-24-11, last TSH 2 years ago  4.92, last filled 07-22-12 #45 1

## 2012-11-14 ENCOUNTER — Other Ambulatory Visit (INDEPENDENT_AMBULATORY_CARE_PROVIDER_SITE_OTHER): Payer: Medicare Other

## 2012-11-14 DIAGNOSIS — E039 Hypothyroidism, unspecified: Secondary | ICD-10-CM

## 2012-11-18 ENCOUNTER — Encounter: Payer: Self-pay | Admitting: *Deleted

## 2012-11-28 ENCOUNTER — Ambulatory Visit (INDEPENDENT_AMBULATORY_CARE_PROVIDER_SITE_OTHER): Payer: Medicare Other | Admitting: Internal Medicine

## 2012-11-28 ENCOUNTER — Encounter: Payer: Self-pay | Admitting: Internal Medicine

## 2012-11-28 VITALS — BP 120/60 | HR 55 | Ht 67.0 in | Wt 161.2 lb

## 2012-11-28 DIAGNOSIS — I428 Other cardiomyopathies: Secondary | ICD-10-CM | POA: Insufficient documentation

## 2012-11-28 DIAGNOSIS — R0609 Other forms of dyspnea: Secondary | ICD-10-CM

## 2012-11-28 DIAGNOSIS — I4891 Unspecified atrial fibrillation: Secondary | ICD-10-CM

## 2012-11-28 DIAGNOSIS — I429 Cardiomyopathy, unspecified: Secondary | ICD-10-CM

## 2012-11-28 LAB — BASIC METABOLIC PANEL
CO2: 27 mEq/L (ref 19–32)
Calcium: 9.4 mg/dL (ref 8.4–10.5)
Creatinine, Ser: 1.2 mg/dL (ref 0.4–1.2)
Glucose, Bld: 97 mg/dL (ref 70–99)
Sodium: 136 mEq/L (ref 135–145)

## 2012-11-28 MED ORDER — LISINOPRIL 2.5 MG PO TABS: 2.5000 mg | ORAL_TABLET | Freq: Every day | ORAL | Status: DC

## 2012-11-28 NOTE — Assessment & Plan Note (Addendum)
Oxygenation on room air with ambulation to keep the O2 sats in the low 90s.  I will also arrange for her to have a pulmonary evaluation for pulmonary fibrosis  We'll also get a BNP today to try to help [place] discriminate

## 2012-11-28 NOTE — Progress Notes (Signed)
Patient Care Team: Pecola Lawless, MD as PCP - General   HPI  [name] is a 79 y.o. female Seen in followup for PAFib and pulm fibrosis  Echo>>LV dysfunc so I changed CCB>>BB and added ACE She underwent DCCV 6/6 >>NSR   She is aware that her breathing is somewhat better. However, she has had significant problems with diarrhea as well as fatigue and weakness with lightheadedness which he correlates with a low blood pressure.   Past Medical History  Diagnosis Date  . Obstructive sleep apnea     CPAP Intolerant  . Rheumatoid arthritis(714.0)     Dr.Truslow  . Cystitis 1974  . Thyroid disease     HYPOTHROIDISM  . DJD (degenerative joint disease)   . Idiopathic pulmonary fibrosis     IDIOPATHIC vs CHRONIC ERD  . Hyperlipidemia 2005    NMR LIPOPROFILE: LDL 144 (1570/732), HDL 61, TG 103., LDL GOAL = < 130  . Atrial fibrillation     Paroxysmal  . Left bundle branch block   . Osteoporosis   . Shortness of breath     Past Surgical History  Procedure Laterality Date  . Abdominal hysterectomy      Abnormal pap smear; ovaries remaining  . Cholecystectomy  1966  . Appendectomy  1966  . Cystoscopy      Hematuria   . Cardioversion N/A 11/07/2012    Procedure: CARDIOVERSION;  Surgeon: Cassell Clement, MD;  Location: [facility] ENDOSCOPY;  Service: Cardiovascular;  Laterality: N/A;    Current Outpatient Prescriptions  Medication Sig Dispense Refill  . acetaminophen (TYLENOL) 325 MG tablet Take 325 mg by mouth every 6 (six) hours as needed for pain.      Marland Kitchen Fluticasone-Salmeterol (ADVAIR) 100-50 MCG/DOSE AEPB Inhale 1 puff into the lungs as needed.      . furosemide (LASIX) 20 MG tablet Take 1 tablet (20 mg total) by mouth daily.  30 tablet  6  . HYDROcodone-homatropine (HYCODAN) 5-1.5 MG/5ML syrup Take 5 mLs by mouth every 6 (six) hours as needed. For cough.  120 mL  0  . levothyroxine (SYNTHROID, LEVOTHROID) 50 MCG tablet APPOINTMENT OVERDUE, 1 1/2 by mouth on M/W/F/Sun and 1 on  T/TH/Sat  45 tablet  0  . lisinopril (PRINIVIL,ZESTRIL) 5 MG tablet Take 1 tablet (5 mg total) by mouth daily.  30 tablet  6  . metoprolol succinate (TOPROL-XL) 50 MG 24 hr tablet Take 1 tablet (50 mg total) by mouth daily. Take with or immediately following a meal.  30 tablet  6  . potassium chloride SA (K-DUR,KLOR-CON) 20 MEQ tablet Take 1 tablet (20 mEq total) by mouth daily.  30 tablet  6  . warfarin (COUMADIN) 5 MG tablet Take once daily or as directed based on PT/INR reading  60 tablet  0  . warfarin (COUMADIN) 5 MG tablet TAKE BY MOUTH ONCE DAILY OR AS DIRECTED BASED ON PT/INR READING  60 tablet  0  . albuterol (PROVENTIL HFA;VENTOLIN HFA) 108 (90 BASE) MCG/ACT inhaler Inhale 2 puffs into the lungs every 4 (four) hours as needed for wheezing (or cough).       No current facility-administered medications for this visit.    Allergies  Allergen Reactions  . Amoxicillin     REACTION: upset stomach    Review of Systems negative except from HPI and PMH  Physical Exam BP 120/60  Pulse 55  Ht 5\' 7"  (1.702 m)  Wt 161 lb 3.2 oz (73.12 kg)  BMI 25.24 kg/m2  Well developed and well nourished in no acute distress HENT normal E scleral and icterus clear Neck Supple JVP flat; carotids brisk and full Clear to ausculation decreaesed  Regular rate and rhythm, no murmurs gallops or rub Soft with active bowel sounds No clubbing cyanosis none Edema Alert and oriented, grossly normal motor and sensory function Skin Warm and Dry  ECG demonstrates sinus rhythm at 55 Intervals 23/16/43 Left bundle branch block  Assessment and  Plan

## 2012-11-28 NOTE — Patient Instructions (Addendum)
You will need lab work today: BMP & BNP  We will call you with your results  Medication changes:  STOP:  Metoprolol succinate                                                               Decrease:  Lisinopril by 1/2 ( 2.5mg  daily)  Follow-up with Dr. [name] in 4-5 weeks  You have been referred to Pulmonology

## 2012-11-28 NOTE — Assessment & Plan Note (Signed)
Holding sinus rhythm following cardioversion. Continue warfarin.

## 2012-11-28 NOTE — Assessment & Plan Note (Addendum)
She has a newly identified cardiomyopathy and left bundle branch block. We have started her on on an ACE inhibitor We will check a metabolic profile today.  She unfortunately has developed diarrhea on metoprolol. My review demonstrates that this is a class affect.  hence we will discontinue it altogether.  Reassessment of her left ventricular function over time would be prudent for [place] to decide whether she might benefit from CRT

## 2012-12-02 ENCOUNTER — Ambulatory Visit (INDEPENDENT_AMBULATORY_CARE_PROVIDER_SITE_OTHER): Payer: Medicare Other | Admitting: Pulmonary Disease

## 2012-12-02 ENCOUNTER — Other Ambulatory Visit (INDEPENDENT_AMBULATORY_CARE_PROVIDER_SITE_OTHER): Payer: Medicare Other

## 2012-12-02 ENCOUNTER — Encounter: Payer: Self-pay | Admitting: Pulmonary Disease

## 2012-12-02 VITALS — BP 110/72 | HR 79 | Temp 97.9°F | Ht 67.0 in | Wt 162.4 lb

## 2012-12-02 DIAGNOSIS — J8410 Pulmonary fibrosis, unspecified: Secondary | ICD-10-CM

## 2012-12-02 DIAGNOSIS — I429 Cardiomyopathy, unspecified: Secondary | ICD-10-CM

## 2012-12-02 DIAGNOSIS — I428 Other cardiomyopathies: Secondary | ICD-10-CM

## 2012-12-02 NOTE — Assessment & Plan Note (Addendum)
Unclear how much of dyspnea is attributable to IPF vs heart disease vs deconditioning We will revisit the degree of fibrosis that you have & see if you qualify for the new medication - perfenidone Breathing test Oxygen check during sleep Blood work You will also qualify for a rehab program

## 2012-12-02 NOTE — Progress Notes (Signed)
Subjective:    Patient ID: [name], female    DOB: 03/22/1932, 79 y.o.   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 She was diagnosed by Dr. Jayme [name] in 2006. I note that the CT chest in December 2006 showed bilateral peripheral interstitial disease with honeycombing consistent with IPF. There was also a 10x6 mm density in the right middle lobe .rheumatoid arthritis factor was positive, she was seen by Dr. Kellie [name] and he felt that her symptoms were more related to osteoarthritis  She was lost to followup since.  She reports increasing dyspnea on exertion for the last year to the point where she cannot walk in the parking lot without huffing and puffing. She reports an occasional cough and denies paroxysmal nocturnal dyspnea, orthopnea pedal edema. She denies wheezing but albuterol does offer some relief Chest x-ray in December 2012 showed persistent interstitial infiltrates. She developed proximal atrial fibrillation and  underwent DCCV 11/07/12 >>NSR Echo showed LV dysfunc  With EF of 35% and RVSP 48 Calcium channel blocker was changed to beta blocker and lisinopril was added. She developed diarrhea and hence metoprolol was stopped. She continues on Lasix daily   polysomnogram in 2006 showed mild obstructive sleep apnea with AHI of 10 events per hour in desaturation to 80% with a few periodic limb movements     Past Medical History  Diagnosis Date  . Obstructive sleep apnea     CPAP Intolerant  . Rheumatoid arthritis(714.0)     [name]  . Cystitis 1974  . Thyroid disease     HYPOTHROIDISM  . DJD (degenerative joint disease)   . Idiopathic pulmonary fibrosis     IDIOPATHIC vs CHRONIC ERD  . Hyperlipidemia 2005    NMR LIPOPROFILE: LDL 144 (1570/732), HDL 61, TG 103., LDL GOAL = < 130  . Atrial fibrillation     Paroxysmal  . Left bundle branch block   . Osteoporosis   . Shortness of breath     Past Surgical History   Procedure Laterality Date  . Abdominal hysterectomy      Abnormal pap smear; ovaries remaining  . Cholecystectomy  1966  . Appendectomy  1966  . Cystoscopy      Hematuria   . Cardioversion N/A 11/07/2012    Procedure: CARDIOVERSION;  Surgeon: Megan Clement, MD;  Location: [facility] ENDOSCOPY;  Service: Cardiovascular;  Laterality: N/A;    Allergies  Allergen Reactions  . Amoxicillin     REACTION: upset stomach    History   Social History  . Marital Status: Married    Spouse Name: N/A    Number of Children: 1  . Years of Education: N/A   Occupational History  . Retired     [employer]   Social History Main Topics  . Smoking status: Former Smoker -- 0.25 packs/day for 30 years    Types: Cigarettes    Quit date: 06/04/1965  . Smokeless tobacco: Never Used  . Alcohol Use: Yes     Comment: Social  . Drug Use: No  . Sexually Active: Not on file   Other Topics Concern  . Not on file   Social History Narrative   NO REGULAR EXERCISE   11/03/09 [employer] SIGNED APPOINTING HUSBAND [name]; OK TO LEAVE MESSAGE ON HOME # [phone]    Family History  Problem Relation Age of Onset  . Coronary artery disease Father   . Transient ischemic attack Father   .  Stroke Father     TIA  . Asthma Mother      Review of Systems  Constitutional: Negative for fever and unexpected weight change.  HENT: Negative for ear pain, nosebleeds, congestion, sore throat, rhinorrhea, sneezing, trouble swallowing, dental problem, postnasal drip and sinus pressure.   Eyes: Negative for redness and itching.  Respiratory: Positive for cough, shortness of breath and wheezing. Negative for chest tightness.   Cardiovascular: Positive for leg swelling. Negative for palpitations.  Gastrointestinal: Negative for nausea and vomiting.  Genitourinary: Negative for dysuria.  Musculoskeletal: Negative for joint swelling.  Skin: Negative for rash.  Neurological: Negative for headaches.   Hematological: Does not bruise/bleed easily.  Psychiatric/Behavioral: Negative for dysphoric mood. The patient is not nervous/anxious.        Objective:   Physical Exam  Gen. Pleasant, well-nourished, in no distress, normal affect ENT - no lesions, no post nasal drip Neck: No JVD, no thyromegaly, no carotid bruits Lungs: no use of accessory muscles, no dullness to percussion, bibasal 1/3 rales, no rhonchi  Cardiovascular: Rhythm regular, heart sounds  normal, no murmurs or gallops, no peripheral edema Abdomen: soft and non-tender, no hepatosplenomegaly, BS normal. Musculoskeletal: No deformities, no cyanosis or clubbing Neuro:  alert, non focal       Assessment & Plan:

## 2012-12-02 NOTE — Patient Instructions (Addendum)
We will revisit the degree of fibrosis that you have & see if you qualify for the new medication - perfenidone Breathing test Oxygen check during sleep Blood work Ct scan of chest You will also qualify for a rehab program

## 2012-12-03 LAB — ANA: Anti Nuclear Antibody(ANA): NEGATIVE

## 2012-12-03 NOTE — Assessment & Plan Note (Signed)
Both BB & ACE ok in this situation unless cough becomes bothersome I am not convinced she has asthma Seems like BB was stopped due to diarrhea

## 2012-12-04 ENCOUNTER — Ambulatory Visit (INDEPENDENT_AMBULATORY_CARE_PROVIDER_SITE_OTHER)
Admission: RE | Admit: 2012-12-04 | Discharge: 2012-12-04 | Disposition: A | Discharge status: Home / Self Care | Payer: Medicare Other | Source: Ambulatory Visit | Attending: Pulmonary Disease | Admitting: Pulmonary Disease

## 2012-12-04 ENCOUNTER — Telehealth: Payer: Self-pay | Admitting: *Deleted

## 2012-12-04 ENCOUNTER — Ambulatory Visit (INDEPENDENT_AMBULATORY_CARE_PROVIDER_SITE_OTHER): Payer: Medicare Other | Admitting: Pulmonary Disease

## 2012-12-04 DIAGNOSIS — J8410 Pulmonary fibrosis, unspecified: Secondary | ICD-10-CM

## 2012-12-04 DIAGNOSIS — R0609 Other forms of dyspnea: Secondary | ICD-10-CM

## 2012-12-04 LAB — PULMONARY FUNCTION TEST

## 2012-12-04 NOTE — Progress Notes (Signed)
PFT done today. 

## 2012-12-04 NOTE — Telephone Encounter (Signed)
The patient walked in the office earlier today with continued complaints of diarrhea off metoprolol. She wonders if this was coming from her lisinopril. I explained the risk of that was probably low. Discussed with Audrie Lia, Pharm D- she states the diarrhea should be clear if from metoprolol and the lisinopril should not be causing this. I reviewed with Dr. [name]- ok to hold lisinopril for a few days to see if symptoms subside. I have discussed with the patient and she is agreeable. She will call me back next week to follow up on her symptoms.

## 2012-12-09 ENCOUNTER — Telehealth: Payer: Self-pay | Admitting: Internal Medicine

## 2012-12-09 MED ORDER — WARFARIN SODIUM 5 MG PO TABS: ORAL_TABLET | ORAL | Status: DC

## 2012-12-09 NOTE — Telephone Encounter (Signed)
Patient is out of her Warfarin Rx and is calling in for a refill. Says that she called her pharmacy early last week to request a refill but was told by them that we had not responded to the request. She uses [facility] nurse on [address].

## 2012-12-09 NOTE — Telephone Encounter (Signed)
Rx sent 

## 2012-12-11 NOTE — Telephone Encounter (Signed)
I left a message for the patient to call regarding her symptoms.

## 2012-12-12 ENCOUNTER — Telehealth: Payer: Self-pay | Admitting: Pulmonary Disease

## 2012-12-12 ENCOUNTER — Ambulatory Visit (INDEPENDENT_AMBULATORY_CARE_PROVIDER_SITE_OTHER): Payer: Medicare Other | Admitting: *Deleted

## 2012-12-12 VITALS — BP 126/70 | HR 68 | Temp 98.1°F | Wt 162.0 lb

## 2012-12-12 DIAGNOSIS — Z7901 Long term (current) use of anticoagulants: Secondary | ICD-10-CM

## 2012-12-12 DIAGNOSIS — I4891 Unspecified atrial fibrillation: Secondary | ICD-10-CM

## 2012-12-12 NOTE — Telephone Encounter (Signed)
OK 

## 2012-12-12 NOTE — Telephone Encounter (Signed)
[name] had nothing available and she is out of the office that wed and Friday. I scheduled pt to see RA 12/19/12 at 1:45.

## 2012-12-12 NOTE — Telephone Encounter (Signed)
RA is not in the office on 7/15 and the days he is here he is double booked. Do you think he would be okay with TP seeing this pt?

## 2012-12-12 NOTE — Telephone Encounter (Signed)
Please advise Dr. Alva thanks 

## 2012-12-12 NOTE — Patient Instructions (Addendum)
Take 5mg  for the next 3 days and then take 2.5mg  on Tues and Thurs, all other days take 5mg . Reckeck INR in 3 weeks.

## 2012-12-19 ENCOUNTER — Ambulatory Visit (INDEPENDENT_AMBULATORY_CARE_PROVIDER_SITE_OTHER): Payer: Medicare Other | Admitting: Pulmonary Disease

## 2012-12-19 ENCOUNTER — Encounter: Payer: Self-pay | Admitting: Pulmonary Disease

## 2012-12-19 VITALS — BP 118/64 | HR 75 | Temp 96.7°F | Ht 66.5 in | Wt 160.6 lb

## 2012-12-19 DIAGNOSIS — J8410 Pulmonary fibrosis, unspecified: Secondary | ICD-10-CM

## 2012-12-19 NOTE — Progress Notes (Signed)
  Subjective:    Patient ID: [name], female    DOB: 23-Dec-1931, 79 y.o.   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 She was diagnosed by Dr. Jayme Cloud in 2006. I note that the CT chest in December 2006 showed bilateral peripheral interstitial disease with honeycombing consistent with IPF. There was also a 10x6 mm density in the right middle lobe .rheumatoid arthritis factor was positive, she was seen by Dr. Kellie Simmering and he felt that her symptoms were more related to osteoarthritis  She was lost to followup since.  She reports increasing dyspnea on exertion for the last year to the point where she cannot walk in the parking lot without huffing and puffing. She reports an occasional cough and denies paroxysmal nocturnal dyspnea, orthopnea pedal edema. She denies wheezing but albuterol does offer some relief  Chest x-ray in December 2012 showed persistent interstitial infiltrates.  She developed proximal atrial fibrillation and underwent DCCV 11/07/12 >>NSR  Echo showed LV dysfunc With EF of 35% and RVSP 48  Calcium channel blocker was changed to beta blocker and lisinopril was added. She developed diarrhea and hence metoprolol was stopped. She continues on Lasix daily  polysomnogram in 2006 showed mild obstructive sleep apnea with AHI of 10 events per hour in desaturation to 80% with a few periodic limb movements   12/19/2012 RA factor 90, CCP neg, ANA neg, ESR 16 pt reports her breathing has unchanged. Occasional wheezing and chest tx. 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 PFTs - FVC 70%, TLC 61% , DLCO 35%, no BD response   Review of Systems neg for any significant sore throat, dysphagia, itching, sneezing, nasal congestion or excess/ purulent secretions, fever, chills, sweats, unintended  wt loss, pleuritic or exertional cp, hempoptysis, orthopnea pnd or change in chronic leg swelling. Also denies presyncope, palpitations, heartburn, abdominal pain, nausea, vomiting, diarrhea or change in bowel or urinary habits, dysuria,hematuria, rash, arthralgias, visual complaints, headache, numbness weakness or ataxia.     Objective:   Physical Exam  Gen. Pleasant, well-nourished, in no distress ENT - no lesions, no post nasal drip Neck: No JVD, no thyromegaly, no carotid bruits Lungs: no use of accessory muscles, no dullness to percussion, bibasal  Rales, no rhonchi  Cardiovascular: Rhythm regular, heart sounds  normal, no murmurs or gallops, no peripheral edema Musculoskeletal: No deformities, no cyanosis or clubbing        Assessment & Plan:

## 2012-12-19 NOTE — Patient Instructions (Addendum)
You have Idiopathic Pulmonary Fibrosis You may qualify for new medication- PERFENIDONE Our research nurse will contact you Lisinopril can cause cough

## 2012-12-19 NOTE — Assessment & Plan Note (Signed)
You have Idiopathic Pulmonary Fibrosis You may qualify for new medication- PERFENIDONE, we discussed side effects Our research nurse will contact you Lisinopril can cause cough - would prefer ARB if cough gets worse Pulm rehab

## 2012-12-24 ENCOUNTER — Encounter: Payer: Self-pay | Admitting: *Deleted

## 2012-12-25 ENCOUNTER — Encounter: Payer: Self-pay | Admitting: Internal Medicine

## 2012-12-25 ENCOUNTER — Ambulatory Visit (INDEPENDENT_AMBULATORY_CARE_PROVIDER_SITE_OTHER): Payer: Medicare Other | Admitting: Internal Medicine

## 2012-12-25 VITALS — BP 138/83 | HR 69 | Ht 66.0 in | Wt 156.6 lb

## 2012-12-25 DIAGNOSIS — I4891 Unspecified atrial fibrillation: Secondary | ICD-10-CM

## 2012-12-25 DIAGNOSIS — I428 Other cardiomyopathies: Secondary | ICD-10-CM

## 2012-12-25 DIAGNOSIS — R0609 Other forms of dyspnea: Secondary | ICD-10-CM

## 2012-12-25 DIAGNOSIS — I429 Cardiomyopathy, unspecified: Secondary | ICD-10-CM

## 2012-12-25 MED ORDER — SPIRONOLACTONE 25 MG PO TABS: ORAL_TABLET | ORAL | Status: DC

## 2012-12-25 NOTE — Progress Notes (Signed)
Patient Care Team: Pecola Lawless, MD as PCP - General   HPI  [name] is a 79 y.o. female  seen in followup for acutely identify left ventricular dysfunction with left bundle branch block. She had diarrhea as a complication of a beta blocker.  She is currently being treated with an ACE inhibitor  She has a remote history atrial fibrillation with CHADSVASC scroe of >4 prior TIA, gender and age  She has chronic diarrhea and has a diagnosis of pulmonary fibrosis. She has been seeing Dr. Vassie Loll  He recommended that we consider the use of an ARB if the cough worsens.  Past Medical History  Diagnosis Date  . Obstructive sleep apnea     CPAP Intolerant  . Rheumatoid arthritis(714.0)     Dr.Truslow  . Cystitis 1974  . Thyroid disease     HYPOTHROIDISM  . DJD (degenerative joint disease)   . Idiopathic pulmonary fibrosis     IDIOPATHIC vs CHRONIC ERD  . Hyperlipidemia 2005    NMR LIPOPROFILE: LDL 144 (1570/732), HDL 61, TG 103., LDL GOAL = < 130  . Atrial fibrillation     Paroxysmal  . Left bundle branch block   . Osteoporosis   . Shortness of breath     Past Surgical History  Procedure Laterality Date  . Abdominal hysterectomy      Abnormal pap smear; ovaries remaining  . Cholecystectomy  1966  . Appendectomy  1966  . Cystoscopy      Hematuria   . Cardioversion N/A 11/07/2012    Procedure: CARDIOVERSION;  Surgeon: Cassell Clement, MD;  Location: [facility] ENDOSCOPY;  Service: Cardiovascular;  Laterality: N/A;    Current Outpatient Prescriptions  Medication Sig Dispense Refill  . acetaminophen (TYLENOL) 325 MG tablet Take 325 mg by mouth every 6 (six) hours as needed for pain.      Marland Kitchen albuterol (PROVENTIL HFA;VENTOLIN HFA) 108 (90 BASE) MCG/ACT inhaler Inhale 2 puffs into the lungs every 4 (four) hours as needed for wheezing (or cough).      . Fluticasone-Salmeterol (ADVAIR) 100-50 MCG/DOSE AEPB Inhale 1 puff into the lungs as needed.      . furosemide (LASIX) 20 MG tablet  Take 20 mg by mouth daily as needed.      Marland Kitchen HYDROcodone-homatropine (HYCODAN) 5-1.5 MG/5ML syrup Take 5 mLs by mouth every 6 (six) hours as needed. For cough.  120 mL  0  . levothyroxine (SYNTHROID, LEVOTHROID) 50 MCG tablet APPOINTMENT OVERDUE, 1 1/2 by mouth on M/W/F/Sun and 1 on T/TH/Sat  45 tablet  0  . lisinopril (PRINIVIL,ZESTRIL) 2.5 MG tablet Take by mouth daily.      . potassium chloride SA (K-DUR,KLOR-CON) 20 MEQ tablet Take 20 mEq by mouth daily as needed.      . warfarin (COUMADIN) 5 MG tablet TAKE BY MOUTH ONCE DAILY OR AS DIRECTED BASED ON PT/INR READING  60 tablet  0   No current facility-administered medications for this visit.    Allergies  Allergen Reactions  . Amoxicillin     REACTION: upset stomach    Review of Systems negative except from HPI and PMH  Physical Exam BP 138/83  Pulse 69  Ht 5\' 6"  (1.676 m)  Wt 156 lb 9.6 oz (71.033 kg)  BMI 25.29 kg/m2 Well developed and well nourished in no acute distress HENT normal E scleral and icterus clear Neck Supple JVP flat; carotids brisk and full Diffuse crackles  Regular rate and rhythm, no murmurs gallops or rub  Soft with active bowel sounds No clubbing cyanosis none Edema Alert and oriented, grossly normal motor and sensory function Skin Warm and Dry  ECG today demonstrates sinus rhythm with left bundle branch block  Assessment and  Plan

## 2012-12-25 NOTE — Assessment & Plan Note (Signed)
Mechanism remains unclear. We will plan to undertake Myoview scanning in about 3 months to allow for any healing that might occur with time, ACE inhibitors and Aldactone.  We have reviewed the side effects of Aldactone will check potassium serially  Last potassium 6/14 was 4.5

## 2012-12-25 NOTE — Assessment & Plan Note (Signed)
Not clear to what degree there is a cardiac component to this. Clearly a large pulmonary component is present. For now we will continue to treat her cardiomyopathy and to further evaluation to assess the underlying mechanism. She is euvolemic. We will be changing her diuretic on aldosterone antagonists.

## 2012-12-25 NOTE — Patient Instructions (Signed)
Your physician has recommended you make the following change in your medication:  1) Stop lasix (furosemide) 2) Stop potassium 3) Start aldactone (spironolactone)  Your physician recommends that you return for lab work in: 3, 6, & 12 weeks- bmp  Your physician has requested that you have a lexiscan myoview in 12 weeks. For further information please visit [email or web address] Please follow instruction sheet, as given.  Your physician wants you to follow-up in: 12 weeks (just after stress test) with Dr. [name]. You will receive a reminder letter in the mail two months in advance. If you don't receive a letter, please call our office to schedule the follow-up appointment.

## 2012-12-25 NOTE — Assessment & Plan Note (Signed)
No recurrent atrial fibrillation. On warfarin

## 2012-12-29 ENCOUNTER — Telehealth: Payer: Self-pay | Admitting: Pulmonary Disease

## 2012-12-29 NOTE — Telephone Encounter (Signed)
ONO showed drop in o2 level x 1hr during night - will qualify her for O2 , if willing can order 2L o2 during sleep

## 2012-12-30 NOTE — Telephone Encounter (Signed)
I spoke with patient about results and she verbalized understanding, She stated she wants to think about it and will call and let me know. Pt also stated she has not heard from [place] either. I called Janette and LMTCB x1 for her

## 2012-12-30 NOTE — Telephone Encounter (Signed)
I spoke with [name] regarding pt. Will sign off message and await pt call back.

## 2013-01-01 ENCOUNTER — Ambulatory Visit: Payer: Medicare Other

## 2013-01-02 ENCOUNTER — Ambulatory Visit (INDEPENDENT_AMBULATORY_CARE_PROVIDER_SITE_OTHER): Payer: Medicare Other

## 2013-01-02 VITALS — BP 130/64 | HR 63 | Wt 158.4 lb

## 2013-01-02 DIAGNOSIS — Z7901 Long term (current) use of anticoagulants: Secondary | ICD-10-CM

## 2013-01-02 DIAGNOSIS — I4891 Unspecified atrial fibrillation: Secondary | ICD-10-CM

## 2013-01-02 NOTE — Patient Instructions (Signed)
Please continue same dosing. 5mg  everyday except Tuesday and Thursday 2.5 Recheck 4 weeks.  Hang in there!!!

## 2013-01-05 ENCOUNTER — Encounter: Payer: Self-pay | Admitting: Pulmonary Disease

## 2013-01-13 ENCOUNTER — Telehealth: Payer: Self-pay | Admitting: Pulmonary Disease

## 2013-01-13 NOTE — Telephone Encounter (Signed)
Pt talked to Mindy earlier about this issue. She would like Mindy to call her back.

## 2013-01-13 NOTE — Telephone Encounter (Signed)
I have not spoken with pt any today. Last conversation with pt was back 12/30/12.   LMTCB x1 for pt and advised ask for triage as I am in [place].

## 2013-01-14 NOTE — Telephone Encounter (Signed)
I spoke with pt and she wanted to know how far she dropped at night on her ONO. She stated one time during the night she took it off to go the bathroom. She stated she has a lot going on with her heart problem. She stated she is trying to get things straight with that. She has a lot going on and is trying to down size her house and stated she would not even have room for the O2 concentrator at night. She stated she wants to think about this and will get back with [place]. Will forward to RA so he is aware.

## 2013-01-14 NOTE — Telephone Encounter (Signed)
Called, spoke with pt -  Pt would like to speak with Mindy only.  I tried to explain to pt that I could help, but she only wants to talk with Mindy.  It is regards to her o2.  This is all she would tell me.

## 2013-01-14 NOTE — Telephone Encounter (Signed)
Pt aware.

## 2013-01-14 NOTE — Telephone Encounter (Signed)
OK to hold off O2 for now- we acn review next visit Call if worse in interim

## 2013-01-15 ENCOUNTER — Other Ambulatory Visit (INDEPENDENT_AMBULATORY_CARE_PROVIDER_SITE_OTHER): Payer: Medicare Other

## 2013-01-15 DIAGNOSIS — I4891 Unspecified atrial fibrillation: Secondary | ICD-10-CM

## 2013-01-15 DIAGNOSIS — I429 Cardiomyopathy, unspecified: Secondary | ICD-10-CM

## 2013-01-15 DIAGNOSIS — I428 Other cardiomyopathies: Secondary | ICD-10-CM

## 2013-01-15 DIAGNOSIS — R0609 Other forms of dyspnea: Secondary | ICD-10-CM

## 2013-01-15 LAB — BASIC METABOLIC PANEL
CO2: 30 mEq/L (ref 19–32)
Calcium: 9.2 mg/dL (ref 8.4–10.5)
GFR: 40.2 mL/min — ABNORMAL LOW (ref >60.00)
Sodium: 138 mEq/L (ref 135–145)

## 2013-02-03 ENCOUNTER — Ambulatory Visit (INDEPENDENT_AMBULATORY_CARE_PROVIDER_SITE_OTHER): Payer: Medicare Other

## 2013-02-03 VITALS — BP 128/74 | HR 57 | Temp 97.3°F | Wt 157.0 lb

## 2013-02-03 DIAGNOSIS — Z7901 Long term (current) use of anticoagulants: Secondary | ICD-10-CM

## 2013-02-03 DIAGNOSIS — I4891 Unspecified atrial fibrillation: Secondary | ICD-10-CM

## 2013-02-03 LAB — POCT INR: INR: 2.1

## 2013-02-03 NOTE — Patient Instructions (Signed)
Take 5mg  everyday except 2.5mg  Tues and Thurs. Recheck in one month.  Best of luck with the move.

## 2013-02-05 ENCOUNTER — Ambulatory Visit (INDEPENDENT_AMBULATORY_CARE_PROVIDER_SITE_OTHER): Payer: Medicare Other | Admitting: *Deleted

## 2013-02-05 DIAGNOSIS — I4891 Unspecified atrial fibrillation: Secondary | ICD-10-CM

## 2013-02-05 LAB — BASIC METABOLIC PANEL
Calcium: 9.1 mg/dL (ref 8.4–10.5)
GFR: 39.52 mL/min — ABNORMAL LOW (ref >60.00)
Potassium: 4.1 mEq/L (ref 3.5–5.1)
Sodium: 136 mEq/L (ref 135–145)

## 2013-02-16 ENCOUNTER — Telehealth (HOSPITAL_COMMUNITY): Payer: Self-pay | Admitting: *Deleted

## 2013-02-17 ENCOUNTER — Other Ambulatory Visit: Payer: Self-pay | Admitting: Internal Medicine

## 2013-02-18 ENCOUNTER — Other Ambulatory Visit: Payer: Self-pay | Admitting: *Deleted

## 2013-02-18 MED ORDER — LEVOTHYROXINE SODIUM 50 MCG PO TABS: ORAL_TABLET | ORAL | Status: DC

## 2013-02-18 NOTE — Telephone Encounter (Signed)
Rx was refilled for levothyroxin 50 mcg.  Ag cma

## 2013-03-02 ENCOUNTER — Telehealth: Payer: Self-pay | Admitting: Internal Medicine

## 2013-03-02 NOTE — Telephone Encounter (Signed)
Follow Up  Pt calling about lab results.

## 2013-03-02 NOTE — Telephone Encounter (Signed)
Called patient with normal lab results.

## 2013-03-05 ENCOUNTER — Ambulatory Visit (INDEPENDENT_AMBULATORY_CARE_PROVIDER_SITE_OTHER): Payer: Medicare Other | Admitting: *Deleted

## 2013-03-05 VITALS — BP 122/60 | HR 68 | Temp 97.5°F | Wt 155.0 lb

## 2013-03-05 DIAGNOSIS — Z7901 Long term (current) use of anticoagulants: Secondary | ICD-10-CM

## 2013-03-05 DIAGNOSIS — I4891 Unspecified atrial fibrillation: Secondary | ICD-10-CM

## 2013-03-05 LAB — POCT INR: INR: 1.6

## 2013-03-05 NOTE — Patient Instructions (Signed)
Take 7.5mg  today Then take 5mg  for the next 5 days, then return on day 6 for recheck of INR

## 2013-03-09 ENCOUNTER — Encounter: Payer: Self-pay | Admitting: Cardiology

## 2013-03-10 ENCOUNTER — Encounter: Payer: Self-pay | Admitting: Cardiology

## 2013-03-11 ENCOUNTER — Ambulatory Visit (HOSPITAL_COMMUNITY): Payer: Medicare Other | Attending: Cardiology | Admitting: Radiology

## 2013-03-11 ENCOUNTER — Encounter (INDEPENDENT_AMBULATORY_CARE_PROVIDER_SITE_OTHER): Payer: Self-pay

## 2013-03-11 ENCOUNTER — Ambulatory Visit (INDEPENDENT_AMBULATORY_CARE_PROVIDER_SITE_OTHER): Payer: Medicare Other | Admitting: *Deleted

## 2013-03-11 VITALS — BP 134/68 | Ht 66.0 in | Wt 154.0 lb

## 2013-03-11 DIAGNOSIS — R51 Headache: Secondary | ICD-10-CM

## 2013-03-11 DIAGNOSIS — I447 Left bundle-branch block, unspecified: Secondary | ICD-10-CM

## 2013-03-11 DIAGNOSIS — R002 Palpitations: Secondary | ICD-10-CM | POA: Insufficient documentation

## 2013-03-11 DIAGNOSIS — R0989 Other specified symptoms and signs involving the circulatory and respiratory systems: Secondary | ICD-10-CM | POA: Insufficient documentation

## 2013-03-11 DIAGNOSIS — I4891 Unspecified atrial fibrillation: Secondary | ICD-10-CM | POA: Insufficient documentation

## 2013-03-11 DIAGNOSIS — I2510 Atherosclerotic heart disease of native coronary artery without angina pectoris: Secondary | ICD-10-CM

## 2013-03-11 DIAGNOSIS — E785 Hyperlipidemia, unspecified: Secondary | ICD-10-CM | POA: Insufficient documentation

## 2013-03-11 DIAGNOSIS — R0602 Shortness of breath: Secondary | ICD-10-CM

## 2013-03-11 DIAGNOSIS — Z87891 Personal history of nicotine dependence: Secondary | ICD-10-CM | POA: Insufficient documentation

## 2013-03-11 DIAGNOSIS — I429 Cardiomyopathy, unspecified: Secondary | ICD-10-CM

## 2013-03-11 DIAGNOSIS — R0609 Other forms of dyspnea: Secondary | ICD-10-CM | POA: Insufficient documentation

## 2013-03-11 LAB — BASIC METABOLIC PANEL
BUN: 22 mg/dL (ref 6–23)
Calcium: 9.2 mg/dL (ref 8.4–10.5)
Chloride: 108 mEq/L (ref 96–112)
Creatinine, Ser: 1.3 mg/dL — ABNORMAL HIGH (ref 0.4–1.2)
GFR: 41.97 mL/min — ABNORMAL LOW (ref >60.00)
Glucose, Bld: 82 mg/dL (ref 70–99)
Potassium: 4.4 mEq/L (ref 3.5–5.1)

## 2013-03-11 MED ORDER — REGADENOSON 0.4 MG/5ML IV SOLN
0.4000 mg | Freq: Once | INTRAVENOUS | Status: AC
  Administered 2013-03-11: 0.4 mg via INTRAVENOUS

## 2013-03-11 MED ORDER — AMINOPHYLLINE 25 MG/ML IV SOLN
75.0000 mg | Freq: Once | INTRAVENOUS | Status: AC
  Administered 2013-03-11: 75 mg via INTRAVENOUS

## 2013-03-11 MED ORDER — TECHNETIUM TC 99M SESTAMIBI GENERIC - CARDIOLITE
33.0000 | Freq: Once | INTRAVENOUS | Status: AC | PRN
  Administered 2013-03-11: 33 via INTRAVENOUS

## 2013-03-11 MED ORDER — TECHNETIUM TC 99M SESTAMIBI GENERIC - CARDIOLITE
11.0000 | Freq: Once | INTRAVENOUS | Status: AC | PRN
  Administered 2013-03-11: 11 via INTRAVENOUS

## 2013-03-11 NOTE — Progress Notes (Signed)
[facility] SITE 3 NUCLEAR MED [address] [place], [place] [address] [phone]    Cardiology Nuclear Med Study  [name] is a 79 y.o. female     MRN : [id number]     DOB: 1932/05/25  Procedure Date: 03/11/2013  Nuclear Med Background Indication for Stress Test:  Evaluation for Ischemia History: No prior known history of CAD, 05-31-03 Myocardial Perfusion Study-No ischemia or infarction, EF=65%,and 10-31-12 Echo: EF=35%, mild to moderate AR, and 11-07-12 Cardioversion (AFIB) Cardiac Risk Factors: History of Smoking, LBBB and Lipids  Symptoms:  DOE and Palpitations   Nuclear Pre-Procedure Caffeine/Decaff Intake:  None > 12 hrs NPO After: 8:00pm   Lungs:  clear O2 Sat: 98% on room air. IV 0.9% NS with Angio Cath:  22g  IV Site: R Antecubital  IV Started by:  Dario Guardian, CNMT  Chest Size (in):  36 Cup Size: D  Height: 5\' 6"  (1.676 m)  Weight:  154 lb (69.854 kg)  BMI:  Body mass index is 24.87 kg/(m^2). Tech Comments: Aminophylline 75 mg IVP given post recovery due to persistent headache 7/10 at 12:56pm  with quick resolution of symptoms. Irean Hong, RN.     Nuclear Med Study 1 or 2 day study: 1 day  Stress Test Type:  Lexiscan  Reading MD: Willa Rough, MD  Order Authorizing Provider:  Sherryl Manges, MD  Resting Radionuclide: Technetium 69m Sestamibi  Resting Radionuclide Dose: 11.0 mCi   Stress Radionuclide:  Technetium 32m Sestamibi  Stress Radionuclide Dose: 32.9 mCi           Stress Protocol Rest HR: 69 Stress HR: 93  Rest BP: 134/68 Stress BP: 140/75  Exercise Time (min): n/a METS: n/a   Predicted Max HR: 141 bpm % Max HR: 65.96 bpm Rate Pressure Product: 21308   Dose of Adenosine (mg):  n/a Dose of Lexiscan: 0.4 mg  Dose of Atropine (mg): n/a Dose of Dobutamine: n/a mcg/kg/min (at max HR)  Stress Test Technologist: Bonnita Levan, RN  Nuclear Technologist:  Domenic Polite, CNMT     Rest Procedure:  Myocardial perfusion imaging was  performed at rest 45 minutes following the intravenous administration of Technetium 40m Sestamibi. Rest ECG: Normal sinus rhythm. Left bundle branch block.  Stress Procedure:  The patient received IV Lexiscan 0.4 mg over 15-seconds.  Technetium 67m Sestamibi injected at 30-seconds.  Quantitative spect images were obtained after a 45 minute delay. Stress ECG: No significant change from baseline ECG  QPS Raw Data Images:  Normal; no motion artifact; normal heart/lung ratio. Stress Images:  Small area of mild decreased activity in the apical anterior segment, apical anteroseptal segment, and the apical cap. Rest Images:  Rest images are similar to stress. Subtraction (SDS):  No evidence of ischemia. Transient Ischemic Dilatation (Normal <1.22):  n/a  Lung/Heart Ratio (Normal <0.45):  0.33  Quantitative Gated Spect Images QGS EDV:  64 ml QGS ESV:  23 ml  Impression Exercise Capacity:  Lexiscan with no exercise. BP Response:  Normal blood pressure response. Clinical Symptoms:  Patient had jaw tightness and headache. Patient was given Aminophyllin with relief. ECG Impression:  No significant ST segment change suggestive of ischemia. Comparison with Prior Nuclear Study: Images are compared with the report of the study from December, 2004.  Overall Impression:  Study in 2004 was read as no wall motion abnormality. However left bundle branch block was not present at that time. Currently there is evidence of mild breast attenuation. There is some  decreased wall motion that may be related to the left bundle branch block. There is no definite ischemia. This is a low risk scan.  LV Ejection Fraction: 48%   LV Wall Motion:  There is decreased motion at the apex and the septum.  Willa Rough, MD

## 2013-03-12 ENCOUNTER — Ambulatory Visit (INDEPENDENT_AMBULATORY_CARE_PROVIDER_SITE_OTHER): Payer: Medicare Other | Admitting: *Deleted

## 2013-03-12 VITALS — BP 126/68 | HR 63 | Temp 97.6°F | Wt 157.2 lb

## 2013-03-12 DIAGNOSIS — Z7901 Long term (current) use of anticoagulants: Secondary | ICD-10-CM

## 2013-03-12 DIAGNOSIS — I4891 Unspecified atrial fibrillation: Secondary | ICD-10-CM

## 2013-03-12 LAB — POCT INR: INR: 3.2

## 2013-03-12 NOTE — Patient Instructions (Addendum)
Take 5mg  on Sun, Mon, Wed, Fri Take 2.5mg  on Tues, Thurs, Sat Return in 1 month

## 2013-03-18 ENCOUNTER — Encounter: Payer: Self-pay | Admitting: Internal Medicine

## 2013-03-18 ENCOUNTER — Ambulatory Visit (INDEPENDENT_AMBULATORY_CARE_PROVIDER_SITE_OTHER): Payer: Medicare Other | Admitting: Internal Medicine

## 2013-03-18 VITALS — BP 137/75 | HR 66 | Ht 67.0 in | Wt 156.6 lb

## 2013-03-18 DIAGNOSIS — I428 Other cardiomyopathies: Secondary | ICD-10-CM

## 2013-03-18 DIAGNOSIS — I429 Cardiomyopathy, unspecified: Secondary | ICD-10-CM

## 2013-03-18 DIAGNOSIS — I4891 Unspecified atrial fibrillation: Secondary | ICD-10-CM

## 2013-03-18 MED ORDER — LOSARTAN POTASSIUM 25 MG PO TABS: 25.0000 mg | ORAL_TABLET | Freq: Every day | ORAL | Status: DC

## 2013-03-18 NOTE — Patient Instructions (Signed)
Your physician has recommended you make the following change in your medication:  1) Stop Lisinopril 2) Start Losartan 25mg  daily  Your physician wants you to follow-up in: 6 months with Dr. [name].  You will receive a reminder letter in the mail two months in advance. If you don't receive a letter, please call our office to schedule the follow-up appointment.

## 2013-03-18 NOTE — Assessment & Plan Note (Signed)
Will continue on Aldactone. We'll change ACE--ARB because of cough and pulmonary concerns. Her potassium level was recently checked and was normal. Ejection fraction has interval improvement from 35-45%. At this point there is no device indication

## 2013-03-18 NOTE — Progress Notes (Signed)
Patient Care Team: Pecola Lawless, MD as PCP - General   HPI  [name] is a 79 y.o. female seen in followup for acutely identify left ventricular dysfunction with left bundle branch block. She had diarrhea as a complication of a beta blocker.   She is currently being treated with an ACE inhibitor  She has a remote history atrial fibrillation with CHADSVASC scroe of >4 prior TIA, gender and age   .   Echocardiogram 5/14 had demonstrated EF of 35%. Myoview done last week demonstrated an EF of 45%. There is no evidence of ischemia.  Her breathing is somewhat better. She noted today she rushed him that he struggled with parking. She does have some cough. It is in Dr. Felipa Eth  to that we could switch her from an ACE--ARB and we'll do that today.  Past Medical History  Diagnosis Date  . Obstructive sleep apnea     CPAP Intolerant  . Rheumatoid arthritis(714.0)     Dr.Truslow  . Cystitis 1974  . Thyroid disease     HYPOTHROIDISM  . DJD (degenerative joint disease)   . Idiopathic pulmonary fibrosis     IDIOPATHIC vs CHRONIC ERD  . Hyperlipidemia 2005    NMR LIPOPROFILE: LDL 144 (1570/732), HDL 61, TG 103., LDL GOAL = < 130  . Atrial fibrillation     Paroxysmal  . Left bundle branch block   . Osteoporosis   . Shortness of breath     Past Surgical History  Procedure Laterality Date  . Abdominal hysterectomy      Abnormal pap smear; ovaries remaining  . Cholecystectomy  1966  . Appendectomy  1966  . Cystoscopy      Hematuria   . Cardioversion N/A 11/07/2012    Procedure: CARDIOVERSION;  Surgeon: Cassell Clement, MD;  Location: [facility] ENDOSCOPY;  Service: Cardiovascular;  Laterality: N/A;    Current Outpatient Prescriptions  Medication Sig Dispense Refill  . acetaminophen (TYLENOL) 325 MG tablet Take 325 mg by mouth every 6 (six) hours as needed for pain.      Marland Kitchen albuterol (PROVENTIL HFA;VENTOLIN HFA) 108 (90 BASE) MCG/ACT inhaler Inhale 2 puffs into the lungs every  4 (four) hours as needed for wheezing (or cough).      . Fluticasone-Salmeterol (ADVAIR) 100-50 MCG/DOSE AEPB Inhale 1 puff into the lungs as needed.      Marland Kitchen HYDROcodone-homatropine (HYCODAN) 5-1.5 MG/5ML syrup Take 5 mLs by mouth every 6 (six) hours as needed. For cough.  120 mL  0  . lisinopril (PRINIVIL,ZESTRIL) 2.5 MG tablet Take by mouth daily.      Marland Kitchen spironolactone (ALDACTONE) 25 MG tablet Take 1/2 tablet by mouth daily  30 tablet  6  . warfarin (COUMADIN) 5 MG tablet TAKE BY MOUTH ONCE DAILY OR AS DIRECTED BASED ON PT/INR READING  60 tablet  0   No current facility-administered medications for this visit.    Allergies  Allergen Reactions  . Amoxicillin     REACTION: upset stomach    Review of Systems negative except from HPI and PMH  Physical Exam BP 137/75  Pulse 66  Ht 5\' 7"  (1.702 m)  Wt 156 lb 9.6 oz (71.033 kg)  BMI 24.52 kg/m2 Well developed and well nourished in no acute distress HENT normal E scleral and icterus clear Neck Supple JVP flat; carotids brisk and full Diffuse crackles n  Regular rate and rhythm, no murmurs gallops or rub Soft with active bowel sounds No  clubbing cyanosis none Edema Alert and oriented, grossly normal motor and sensory function Skin Warm and Dry  ECG demonstrates sinus rhythm at 56 Intervals 22/17/43 and an axis less than -49 Left bundle branch block  Assessment and  Plan

## 2013-03-18 NOTE — Assessment & Plan Note (Signed)
Maintaining sinus rhythm 

## 2013-04-20 ENCOUNTER — Telehealth: Payer: Self-pay

## 2013-04-20 ENCOUNTER — Ambulatory Visit: Payer: Medicare Other | Admitting: Adult Health

## 2013-04-20 NOTE — Telephone Encounter (Signed)
No answer at home #--cell--mailbox is full [facility]--06/2009--Dr Brodie-next due 2021 [facility]--05/09/2012--neg PNA--06/2010

## 2013-04-21 ENCOUNTER — Encounter: Payer: Self-pay | Admitting: Internal Medicine

## 2013-04-21 ENCOUNTER — Ambulatory Visit (INDEPENDENT_AMBULATORY_CARE_PROVIDER_SITE_OTHER): Payer: Medicare Other | Admitting: Internal Medicine

## 2013-04-21 VITALS — BP 135/68 | HR 67 | Temp 97.5°F | Ht 67.25 in | Wt 151.2 lb

## 2013-04-21 DIAGNOSIS — E782 Mixed hyperlipidemia: Secondary | ICD-10-CM

## 2013-04-21 DIAGNOSIS — Z0000 Encounter for general adult medical examination without abnormal findings: Secondary | ICD-10-CM

## 2013-04-21 DIAGNOSIS — Z7901 Long term (current) use of anticoagulants: Secondary | ICD-10-CM

## 2013-04-21 DIAGNOSIS — N289 Disorder of kidney and ureter, unspecified: Secondary | ICD-10-CM

## 2013-04-21 NOTE — Progress Notes (Signed)
Pre visit review using our clinic review tool, if applicable. No additional management support is needed unless otherwise documented below in the visit note. 

## 2013-04-21 NOTE — Progress Notes (Signed)
Subjective:    Patient ID: [name], female    DOB: 09/12/1931, 79 y.o.   MRN: [id number]  HPI Medicare Wellness Visit: Psychosocial and medical history were reviewed as required by Medicare (history related to abuse, antisocial behavior , firearm risk). Social history: Caffeine:3 cups coffee/ day  , Alcohol: no , Tobacco WUJ:WJXB  1965 Exercise:no Personal safety/fall risk:no Limitations of activities of daily living:no Seatbelt/ smoke alarm use:yes Healthcare Power of Attorney/Living Will status: in place Ophthalmologic exam status:overdue, PMH cataracts Hearing evaluation status:not current Orientation: Oriented X 3 Memory and recall: good Spelling testing: good Depression/anxiety assessment: denied Foreign travel history:1984 [place] Immunization status for influenza/pneumonia/ shingles /tetanus: tetanus due Transfusion history:no Preventive health care maintenance status: Colonoscopy/BMD/mammogram/Pap as per protocol/standard care:aged out of colonoscopy Dental care:every 12 mos Chart reviewed and updated. Active issues reviewed and addressed as documented below.    Review of Systems   She underwent cardioversion in June. Her thyroid was checked at that time and was therapeutic. No change in thyroid dose indicated.  Chemistries done last month  revealed mild renal insufficiency which is improving  She remains on warfarin. Her PT/INR is overdue.  She states she is having increased visual issues at night which she relates or cataracts. Ophthalmology exam will be scheduled    Objective:   Physical Exam Gen.:  well-nourished in appearance. Alert, appropriate and cooperative throughout exam.  Head: Normocephalic without obvious abnormalities Eyes: No corneal or conjunctival inflammation noted. Pupils equal round reactive to light and accommodation. Extraocular motion intact. Ptosis bilaterally Ears: External  ear exam reveals no significant lesions or deformities.  Canals clear .TMs normal. Hearing is grossly normal bilaterally. Nose: External nasal exam reveals no deformity or inflammation. Nasal mucosa are pink and moist. No lesions or exudates noted.   Mouth: Oral mucosa and oropharynx reveal no lesions or exudates. Teeth in good repair. Neck: No deformities, masses, or tenderness noted. Range of motion decreased. Thyroid normal. Lungs: Scattered rales , pops & wheezes.No increased work of breathing. Heart: Normal rate and rhythm. Normal S1 and S2. No gallop, click, or rub. No murmur. Heart sounds distant Abdomen: Bowel sounds normal; abdomen soft and nontender. No masses, organomegaly or hernias noted.Aorta palpable ; no AAA Genitalia:  as per Gyn                                  Musculoskeletal/extremities:   There is some asymmetry of the posterior thoracic musculature suggesting occult scoliosis. No clubbing, cyanosis, or edema noted. Range of motion decreased @ knees .Tone & strength normal. Hand joints  reveal  DJD DIP changes. Fingernail  health good. Able to lie down & sit up w/o help. Negative SLR bilaterally Vascular: Carotid, radial artery, dorsalis pedis and  posterior tibial pulses are equal.Decreased pedal pulses. No bruits present. Neurologic: Alert and oriented x3. Deep tendon reflexes symmetrical and normal.        Skin: Intact without suspicious lesions or rashes. Lymph: No cervical, axillary lymphadenopathy present. Psych: Mood and affect are normal. Normally interactive  Assessment & Plan:  #1 Medicare Wellness Exam; criteria met ; data entered #2 Problem List/Diagnoses reviewed Plan:  Assessments made/ Orders entered  

## 2013-04-21 NOTE — Telephone Encounter (Signed)
Unable to reach pre visit.  

## 2013-04-21 NOTE — Progress Notes (Signed)
Long term anticoagulation therapy. Reading today is 1.5 with a prescribed range of 2.0-3.0. Patient states that she missed 2 doses. Currently is prescribed to take 5mg  Sa- M-W-F and 2.5mg  Su, Tu, Th. New directions 7.5mg  today Tues 04/21/2013 and 5 mg daily until recheck in 1 week. Patient advised that if readings fluctuate up and down in range she will need to return to the Coumadin Clinic for management. Patient gives verbal understanding. States will follow the directions.

## 2013-04-21 NOTE — Patient Instructions (Addendum)
Warfarin dose change as prescribed with PT/INR in 1 week

## 2013-05-04 ENCOUNTER — Other Ambulatory Visit: Payer: Self-pay | Admitting: Internal Medicine

## 2013-05-04 ENCOUNTER — Ambulatory Visit (INDEPENDENT_AMBULATORY_CARE_PROVIDER_SITE_OTHER): Payer: Medicare Other | Admitting: *Deleted

## 2013-05-04 VITALS — BP 126/76 | HR 80 | Temp 97.8°F | Wt 152.0 lb

## 2013-05-04 DIAGNOSIS — I4891 Unspecified atrial fibrillation: Secondary | ICD-10-CM

## 2013-05-04 DIAGNOSIS — Z7901 Long term (current) use of anticoagulants: Secondary | ICD-10-CM

## 2013-05-04 LAB — POCT INR: INR: 3.6

## 2013-05-04 NOTE — Telephone Encounter (Signed)
Warfarin and Levothyroxine refilled per protocol

## 2013-05-04 NOTE — Patient Instructions (Signed)
5mg daily  Recheck in 2 weeks

## 2013-05-05 ENCOUNTER — Ambulatory Visit (INDEPENDENT_AMBULATORY_CARE_PROVIDER_SITE_OTHER): Payer: Medicare Other | Admitting: Adult Health

## 2013-05-05 ENCOUNTER — Encounter: Payer: Self-pay | Admitting: Adult Health

## 2013-05-05 VITALS — BP 124/58 | HR 72 | Temp 96.8°F | Ht 66.5 in | Wt 153.0 lb

## 2013-05-05 DIAGNOSIS — J8410 Pulmonary fibrosis, unspecified: Secondary | ICD-10-CM

## 2013-05-05 NOTE — Assessment & Plan Note (Signed)
Compensated without flare.  Cough improved off ACE .  O2 sats adequate on RA.  Flu shot utd.  Monitor  for need for supplemental o2.   Plan  Cont on current regimen follow up Dr. Vassie Loll  In 4 months and As needed

## 2013-05-05 NOTE — Progress Notes (Signed)
  Subjective:    Patient ID: [name], female    DOB: 04-30-1932, 79 y.o.   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 She was diagnosed by Dr. Jayme Cloud in 2006. I note that the CT chest in December 2006 showed bilateral peripheral interstitial disease with honeycombing consistent with IPF. There was also a 10x6 mm density in the right middle lobe .rheumatoid arthritis factor was positive, she was seen by Dr. Kellie Simmering and he felt that her symptoms were more related to osteoarthritis  She was lost to followup since.  She reports increasing dyspnea on exertion for the last year to the point where she cannot walk in the parking lot without huffing and puffing. She reports an occasional cough and denies paroxysmal nocturnal dyspnea, orthopnea pedal edema. She denies wheezing but albuterol does offer some relief  Chest x-ray in December 2012 showed persistent interstitial infiltrates.  She developed proximal atrial fibrillation and underwent DCCV 11/07/12 >>NSR  Echo showed LV dysfunc With EF of 35% and RVSP 48  Calcium channel blocker was changed to beta blocker and lisinopril was added. She developed diarrhea and hence metoprolol was stopped. She continues on Lasix daily  polysomnogram in 2006 showed mild obstructive sleep apnea with AHI of 10 events per hour in desaturation to 80% with a few periodic limb movements   12/19/12  RA factor 90, CCP neg, ANA neg, ESR 16 pt reports her breathing has unchanged. Occasional wheezing and chest tx. 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 PFTs - FVC 70%, TLC 61% , DLCO 35%, no BD response >>recommended off ACE   05/05/2013 Follow up Pulmonary Fibrosis Pt returns for 4 month follow up for pulmonary fibrosis.  Last ov referred for eval afor Perfenidone. ?Ace  making cough worse. She was changed to ARB by Cardiology.  Cough is better off ACE , but not totally resolved.  Was contacted by research department for Perfenidone. -has decided not to pursue due to her age.   Had cardioversion for Atrial Fib 11/2012  , echo showed EF improved from 35% to 45%. Feels DOE is better, can walk further.  Had stress test in 03/2013 with no ischemia.  Recently moved to smaller home to downsize.  Patient denies any hemoptysis, chest pain, orthopnea, PND, or increased leg swelling    Review of Systems  neg for any significant sore throat, dysphagia, itching, sneezing, nasal congestion or excess/ purulent secretions, fever, chills, sweats, unintended wt loss, pleuritic or exertional cp, hempoptysis, orthopnea pnd or change in chronic leg swelling. Also denies presyncope, palpitations, heartburn, abdominal pain, nausea, vomiting, diarrhea or change in bowel or urinary habits, dysuria,hematuria, rash, arthralgias, visual complaints, headache, numbness weakness or ataxia.     Objective:   Physical Exam   Gen. Pleasant, well-nourished, in no distress ENT - no lesions, no post nasal drip Neck: No JVD, no thyromegaly, no carotid bruits Lungs: no use of accessory muscles, no dullness to percussion, bibasal  Rales, no rhonchi  Cardiovascular: Rhythm regular, heart sounds  normal, no murmurs or gallops, tr peripheral edema Musculoskeletal: No deformities, no cyanosis or clubbing        Assessment & Plan:

## 2013-05-05 NOTE — Patient Instructions (Signed)
Continue on current regimen .  Follow up Dr. Alva  In 4 months and As needed     

## 2013-05-16 ENCOUNTER — Telehealth (HOSPITAL_COMMUNITY): Payer: Self-pay

## 2013-05-16 NOTE — Telephone Encounter (Signed)
Called patient to offer Pulmonary Rehab.  Patient stated that she is not interested right now but may be after the New Year.  The patient stated that if she is interested that she will contact us.

## 2013-05-19 ENCOUNTER — Ambulatory Visit (INDEPENDENT_AMBULATORY_CARE_PROVIDER_SITE_OTHER): Payer: Medicare Other | Admitting: *Deleted

## 2013-05-19 VITALS — BP 118/60 | HR 67 | Temp 98.6°F | Wt 152.0 lb

## 2013-05-19 DIAGNOSIS — Z7901 Long term (current) use of anticoagulants: Secondary | ICD-10-CM

## 2013-05-19 DIAGNOSIS — I4891 Unspecified atrial fibrillation: Secondary | ICD-10-CM

## 2013-05-19 LAB — POCT INR: INR: 2.4

## 2013-05-19 NOTE — Patient Instructions (Signed)
5mg daily. Recheck in 4 weeks  

## 2013-06-18 ENCOUNTER — Ambulatory Visit (INDEPENDENT_AMBULATORY_CARE_PROVIDER_SITE_OTHER): Payer: Medicare Other | Admitting: *Deleted

## 2013-06-18 VITALS — BP 120/68 | HR 61 | Temp 97.4°F | Wt 152.4 lb

## 2013-06-18 DIAGNOSIS — Z7901 Long term (current) use of anticoagulants: Secondary | ICD-10-CM

## 2013-06-18 DIAGNOSIS — I4891 Unspecified atrial fibrillation: Secondary | ICD-10-CM

## 2013-06-18 LAB — POCT INR: INR: 3

## 2013-06-18 NOTE — Patient Instructions (Signed)
5mg daily. Recheck in 4 weeks  

## 2013-06-26 ENCOUNTER — Other Ambulatory Visit: Payer: Self-pay | Admitting: Neurosurgery

## 2013-06-26 DIAGNOSIS — M545 Low back pain, unspecified: Secondary | ICD-10-CM

## 2013-07-03 ENCOUNTER — Telehealth: Payer: Self-pay | Admitting: Internal Medicine

## 2013-07-03 NOTE — Telephone Encounter (Signed)
Please advise. JG/CMA 

## 2013-07-03 NOTE — Telephone Encounter (Signed)
[name] from [facility] called stating the patient is having an epidural steroid injection done and needs to be off her coumadin for 4 days prior. She is calling to get permission. CB# [phone]

## 2013-07-06 NOTE — Telephone Encounter (Signed)
Dr Sherryl Manges should decide if warfarin can be D/Ced X 4 days. Please forward to his Nurse.I already sent him a message. Thanks, Hopp Miss you !

## 2013-07-08 ENCOUNTER — Ambulatory Visit (INDEPENDENT_AMBULATORY_CARE_PROVIDER_SITE_OTHER): Payer: Medicare Other | Admitting: General Practice

## 2013-07-08 ENCOUNTER — Telehealth: Payer: Self-pay | Admitting: *Deleted

## 2013-07-08 DIAGNOSIS — Z5181 Encounter for therapeutic drug level monitoring: Secondary | ICD-10-CM

## 2013-07-08 LAB — POCT INR: INR: 1.3

## 2013-07-08 NOTE — Progress Notes (Signed)
Pre-visit discussion using our clinic review tool. No additional management support is needed unless otherwise documented below in the visit note.  

## 2013-07-08 NOTE — Telephone Encounter (Signed)
Patient phoned and lmtc on triage line, stating she needs a pt/inr for an upcoming procedure.  Per Carmel Sacramento, CMA for [facility], he is going to be utilizing the coumadin clinic now that he is here at the [place] site.    CB# [phone]

## 2013-07-08 NOTE — Telephone Encounter (Signed)
OK please schedule with [facility].Thank you

## 2013-07-09 ENCOUNTER — Ambulatory Visit
Admission: RE | Admit: 2013-07-09 | Discharge: 2013-07-09 | Disposition: A | Discharge status: Home / Self Care | Payer: Medicare Other | Source: Ambulatory Visit | Attending: Neurosurgery | Admitting: Neurosurgery

## 2013-07-09 VITALS — BP 157/61 | HR 77

## 2013-07-09 DIAGNOSIS — M545 Low back pain, unspecified: Secondary | ICD-10-CM

## 2013-07-09 MED ORDER — IOHEXOL 180 MG/ML  SOLN
1.0000 mL | Freq: Once | INTRAMUSCULAR | Status: AC | PRN
  Administered 2013-07-09: 1 mL via EPIDURAL

## 2013-07-09 MED ORDER — METHYLPREDNISOLONE ACETATE 40 MG/ML INJ SUSP (RADIOLOG
120.0000 mg | Freq: Once | INTRAMUSCULAR | Status: AC
  Administered 2013-07-09: 120 mg via EPIDURAL

## 2013-07-09 NOTE — Telephone Encounter (Signed)
Did you schedule an appt with this patient or speak to her before I close this encounter?  Just making sure she doesn't fall through the cracks.

## 2013-07-09 NOTE — Discharge Instructions (Signed)

## 2013-07-09 NOTE — Telephone Encounter (Signed)
She had PT/INR 2/4

## 2013-07-17 ENCOUNTER — Ambulatory Visit (INDEPENDENT_AMBULATORY_CARE_PROVIDER_SITE_OTHER): Payer: Medicare Other | Admitting: General Practice

## 2013-07-17 DIAGNOSIS — I4891 Unspecified atrial fibrillation: Secondary | ICD-10-CM

## 2013-07-17 DIAGNOSIS — Z5181 Encounter for therapeutic drug level monitoring: Secondary | ICD-10-CM

## 2013-07-17 LAB — POCT INR: INR: 2

## 2013-07-17 NOTE — Progress Notes (Signed)
Pre-visit discussion using our clinic review tool. No additional management support is needed unless otherwise documented below in the visit note.  

## 2013-08-06 ENCOUNTER — Other Ambulatory Visit: Payer: Self-pay | Admitting: Neurosurgery

## 2013-08-06 DIAGNOSIS — M545 Low back pain, unspecified: Secondary | ICD-10-CM

## 2013-08-10 ENCOUNTER — Ambulatory Visit (INDEPENDENT_AMBULATORY_CARE_PROVIDER_SITE_OTHER): Payer: Medicare Other | Admitting: General Practice

## 2013-08-10 DIAGNOSIS — Z7901 Long term (current) use of anticoagulants: Secondary | ICD-10-CM

## 2013-08-10 DIAGNOSIS — I4891 Unspecified atrial fibrillation: Secondary | ICD-10-CM

## 2013-08-10 LAB — POCT INR: INR: 1.1

## 2013-08-10 NOTE — Progress Notes (Signed)
Pre visit review using our clinic review tool, if applicable. No additional management support is needed unless otherwise documented below in the visit note. 

## 2013-08-11 ENCOUNTER — Ambulatory Visit
Admission: RE | Admit: 2013-08-11 | Discharge: 2013-08-11 | Disposition: A | Discharge status: Home / Self Care | Payer: Medicare Other | Source: Ambulatory Visit | Attending: Neurosurgery | Admitting: Neurosurgery

## 2013-08-11 ENCOUNTER — Other Ambulatory Visit: Payer: Medicare Other

## 2013-08-11 VITALS — BP 151/69 | HR 71

## 2013-08-11 DIAGNOSIS — M545 Low back pain, unspecified: Secondary | ICD-10-CM

## 2013-08-11 DIAGNOSIS — M5126 Other intervertebral disc displacement, lumbar region: Secondary | ICD-10-CM

## 2013-08-11 MED ORDER — IOHEXOL 180 MG/ML  SOLN
1.0000 mL | Freq: Once | INTRAMUSCULAR | Status: AC | PRN
  Administered 2013-08-11: 1 mL via EPIDURAL

## 2013-08-11 MED ORDER — METHYLPREDNISOLONE ACETATE 40 MG/ML INJ SUSP (RADIOLOG
120.0000 mg | Freq: Once | INTRAMUSCULAR | Status: AC
  Administered 2013-08-11: 120 mg via EPIDURAL

## 2013-08-11 NOTE — Discharge Instructions (Addendum)
Post Procedure Spinal Discharge Instruction Sheet  1. You may resume a regular diet and any medications that you routinely take (including pain medications).  2. No driving day of procedure.  3. Light activity throughout the rest of the day.  Do not do any strenuous work, exercise, bending or lifting.  The day following the procedure, you can resume normal physical activity but you should refrain from exercising or physical therapy for at least three days thereafter.   Common Side Effects:   Headaches- take your usual medications as directed by your physician.  Increase your fluid intake.  Caffeinated beverages may be helpful.  Lie flat in bed until your headache resolves.   Restlessness or inability to sleep- you may have trouble sleeping for the next few days.  Ask your referring physician if you need any medication for sleep.   Facial flushing or redness- should subside within a few days.   Increased pain- a temporary increase in pain a day or two following your procedure is not unusual.  Take your pain medication as prescribed by your referring physician.   Leg cramps  Please contact our office at [phone] for the following symptoms:  Fever greater than 100 degrees.  Headaches unresolved with medication after 2-3 days.  Increased swelling, pain, or redness at injection site.  Thank you for visiting our office.   May resume coumadin today. 

## 2013-08-14 ENCOUNTER — Ambulatory Visit: Payer: Medicare Other

## 2013-08-21 ENCOUNTER — Ambulatory Visit (INDEPENDENT_AMBULATORY_CARE_PROVIDER_SITE_OTHER): Payer: Medicare Other | Admitting: General Practice

## 2013-08-21 DIAGNOSIS — Z5181 Encounter for therapeutic drug level monitoring: Secondary | ICD-10-CM

## 2013-08-21 DIAGNOSIS — I4891 Unspecified atrial fibrillation: Secondary | ICD-10-CM

## 2013-08-21 LAB — POCT INR: INR: 3.5

## 2013-08-21 NOTE — Progress Notes (Signed)
Pre visit review using our clinic review tool, if applicable. No additional management support is needed unless otherwise documented below in the visit note. 

## 2013-09-11 ENCOUNTER — Ambulatory Visit (INDEPENDENT_AMBULATORY_CARE_PROVIDER_SITE_OTHER): Payer: Medicare Other | Admitting: General Practice

## 2013-09-11 DIAGNOSIS — I4891 Unspecified atrial fibrillation: Secondary | ICD-10-CM

## 2013-09-11 DIAGNOSIS — Z5181 Encounter for therapeutic drug level monitoring: Secondary | ICD-10-CM

## 2013-09-11 LAB — POCT INR: INR: 3.5

## 2013-09-11 NOTE — Progress Notes (Signed)
Pre visit review using our clinic review tool, if applicable. No additional management support is needed unless otherwise documented below in the visit note. 

## 2013-10-02 ENCOUNTER — Encounter: Payer: Self-pay | Admitting: Internal Medicine

## 2013-10-02 ENCOUNTER — Other Ambulatory Visit: Payer: Self-pay | Admitting: General Practice

## 2013-10-02 ENCOUNTER — Ambulatory Visit (INDEPENDENT_AMBULATORY_CARE_PROVIDER_SITE_OTHER): Payer: Medicare Other | Admitting: General Practice

## 2013-10-02 ENCOUNTER — Ambulatory Visit (INDEPENDENT_AMBULATORY_CARE_PROVIDER_SITE_OTHER): Payer: Medicare Other | Admitting: Internal Medicine

## 2013-10-02 VITALS — BP 130/54 | HR 64 | Temp 96.0°F | Resp 15 | Wt 151.0 lb

## 2013-10-02 DIAGNOSIS — I4891 Unspecified atrial fibrillation: Secondary | ICD-10-CM

## 2013-10-02 DIAGNOSIS — J209 Acute bronchitis, unspecified: Secondary | ICD-10-CM

## 2013-10-02 DIAGNOSIS — B379 Candidiasis, unspecified: Secondary | ICD-10-CM

## 2013-10-02 DIAGNOSIS — J8410 Pulmonary fibrosis, unspecified: Secondary | ICD-10-CM

## 2013-10-02 DIAGNOSIS — Z5181 Encounter for therapeutic drug level monitoring: Secondary | ICD-10-CM

## 2013-10-02 LAB — POCT INR: INR: 1.6

## 2013-10-02 MED ORDER — WARFARIN SODIUM 5 MG PO TABS: ORAL_TABLET | ORAL | Status: DC

## 2013-10-02 MED ORDER — DOXYCYCLINE HYCLATE 100 MG PO TABS: 100.0000 mg | ORAL_TABLET | Freq: Two times a day (BID) | ORAL | Status: DC

## 2013-10-02 MED ORDER — KETOCONAZOLE 2 % EX CREA: 1.0000 "application " | TOPICAL_CREAM | Freq: Two times a day (BID) | CUTANEOUS | Status: DC

## 2013-10-02 NOTE — Patient Instructions (Signed)
Check PT/INR after 5 days of antibiotic.

## 2013-10-02 NOTE — Progress Notes (Signed)
Pre visit review using our clinic review tool, if applicable. No additional management support is needed unless otherwise documented below in the visit note. 

## 2013-10-02 NOTE — Progress Notes (Signed)
   Subjective:    Patient ID: [name], female    DOB: 10-Feb-1932, 80 y.o.   MRN: [id number]  HPI  Two issues:   1. Symptoms began 3-4 weeks ago as chest congestion with yellow sputum. She feels the symptoms are improving, however she describes continued wheezing, dyspnea, and fatigue.  Her last antibiotic use was more than 6 months ago.  His history is complicated by pulmonary fibrosis and atrial fib. Today's INR is still pending.   2. Complains of irritation in gluteal crease, possibly related to resolving decubitus ulcer in the context of prior prednisone use.    Review of Systems She denies fever, chills, sweats, nasal congestion, frontal headache, maxillary or dental pain, sore throat.      Objective:   Physical Exam General appearance:thin but well nourished; no acute distress or increased work of breathing is present. No lymphadenopathy about the head, neck, or axilla noted.  Eyes: No conjunctival inflammation or lid edema is present. There is no scleral icterus. Arcus senilis.  Ears: External ear exam shows no significant lesions or deformities. Otoscopic examination reveals clear canals, tympanic membranes are intact bilaterally without bulging, retraction, inflammation or discharge.  Nose: External nasal examination shows no deformity or inflammation. Nasal mucosa are pink and moist without lesions or exudates. No septal dislocation or deviation.No obstruction to airflow.  Oral exam: Dental hygiene is good; lips and gums are healthy appearing.There is mild oropharyngeal erythema without exudate noted. Halitosis.  Neck: No deformities, thyromegaly, masses, or tenderness noted. Supple with full range of motion without pain.  Heart: Normal rate and regular rhythm. S1 and S2 normal without gallop, murmur, click, rub or other extra sounds.  Lungs: Inspiratory wheezing and juicy rales; increased breathing effort, no acute distress.  Extremities: No cyanosis, edema, or clubbing  noted  Skin: Warm & dry w/o jaundice or tenting; erythema & small granulomatous changes ( 1 cm round plaque on R buttock near gluteal crease) between buttocks. No dermal breakdown. External hemorrhoid.       Assessment & Plan:  #1 bronchitis with bronchospasm - Advair, Doxycycline  #2 candidiasis - topical antifungal/Eucerin #3pulmonary fibrosis #4 warfarin therapy; close T/INR monitor on antibiotic See orders

## 2013-10-02 NOTE — Progress Notes (Signed)
   Subjective:    Patient ID: [name], female    DOB: 02/28/1932, 80 y.o.   MRN: [id number]  HPI  Two issues:   1. Symptoms began 3-4 weeks ago as chest congestion with yellow sputum.  She feels the symptoms are improving, however she describes continued wheezing, dyspnea, and fatigue.  Her last antibiotic use was more than 6 months ago.   His history is complicated by pulmonary fibrosis and atrial fib. Today's INR is still pending.   2. Complains of irritation in gluteal crease, possibly related to resolving decubitus ulcer.   Review of Systems She denies fever, chills, sweats, nasal congestion, frontal headache, maxillary or dental pain, sore throat.      Objective:   Physical Exam General appearance:good health ;well nourished; no acute distress or increased work of breathing is present.  No  lymphadenopathy about the head, neck, or axilla noted.   Eyes: No conjunctival inflammation or lid edema is present. There is no scleral icterus. Arcus senilis.   Ears:  External ear exam shows no significant lesions or deformities.  Otoscopic examination reveals clear canals, tympanic membranes are intact bilaterally without bulging, retraction, inflammation or discharge.  Nose:  External nasal examination shows no deformity or inflammation. Nasal mucosa are pink and moist without lesions or exudates. No septal dislocation or deviation.No obstruction to airflow.   Oral exam: Dental hygiene is good; lips and gums are healthy appearing.There is mild oropharyngeal erythema without exudate noted. Halitosis.   Neck:  No deformities, thyromegaly, masses, or tenderness noted.   Supple with full range of motion without pain.   Heart:  Normal rate and regular rhythm. S1 and S2 normal without gallop, murmur, click, rub or other extra sounds.   Lungs: Inspiratory wheezing and expiratory whistling sounds; increased breathing effort, no acute distress.    Extremities:  No cyanosis, edema, or  clubbing  noted   Skin: Warm & dry w/o jaundice or tenting; erythema, scales in gluteal crease. 1 cm round plaque on R buttock near gluteal crease. External hemorrhoid.      Assessment & Plan:  #1 bronchitis with bronchospasm - zpack, Spiriva?  #2 candidiasis - nystatin gel ?

## 2013-10-07 ENCOUNTER — Ambulatory Visit (INDEPENDENT_AMBULATORY_CARE_PROVIDER_SITE_OTHER): Payer: Medicare Other | Admitting: General Practice

## 2013-10-07 DIAGNOSIS — I4891 Unspecified atrial fibrillation: Secondary | ICD-10-CM

## 2013-10-07 DIAGNOSIS — Z5181 Encounter for therapeutic drug level monitoring: Secondary | ICD-10-CM

## 2013-10-07 LAB — POCT INR: INR: 2.7

## 2013-10-07 NOTE — Progress Notes (Signed)
Pre visit review using our clinic review tool, if applicable. No additional management support is needed unless otherwise documented below in the visit note. 

## 2013-11-04 ENCOUNTER — Telehealth: Payer: Self-pay | Admitting: General Practice

## 2013-11-04 ENCOUNTER — Ambulatory Visit (INDEPENDENT_AMBULATORY_CARE_PROVIDER_SITE_OTHER): Payer: Medicare Other | Admitting: General Practice

## 2013-11-04 DIAGNOSIS — Z5181 Encounter for therapeutic drug level monitoring: Secondary | ICD-10-CM

## 2013-11-04 DIAGNOSIS — I4891 Unspecified atrial fibrillation: Secondary | ICD-10-CM

## 2013-11-04 LAB — POCT INR: INR: 1.9

## 2013-11-04 NOTE — Telephone Encounter (Signed)
Message copied by Garrison Columbus on Wed Nov 04, 2013  3:46 PM ------      Message from: Pecola Lawless      Created: Wed Nov 04, 2013  3:41 PM       I have Symbicort       2 puffs q 12 hrs prn      ----- Message -----         From: Garrison Columbus, RN         Sent: 11/04/2013   3:12 PM           To: Pecola Lawless, MD            Patient was just here in coumadin clinic.  She asking if we had any samples of Advair or Albuterol and we don't.  My question is could she use something that we do have in the sample closet? I told her I would let her know if we have anything.  Thanks!            Cindy       ------

## 2013-11-04 NOTE — Progress Notes (Signed)
Pre visit review using our clinic review tool, if applicable. No additional management support is needed unless otherwise documented below in the visit note. 

## 2013-11-16 ENCOUNTER — Other Ambulatory Visit: Payer: Self-pay | Admitting: *Deleted

## 2013-11-16 MED ORDER — LOSARTAN POTASSIUM 25 MG PO TABS: 25.0000 mg | ORAL_TABLET | Freq: Every day | ORAL | Status: DC

## 2013-12-01 ENCOUNTER — Encounter: Payer: Self-pay | Admitting: Internal Medicine

## 2013-12-01 ENCOUNTER — Ambulatory Visit (INDEPENDENT_AMBULATORY_CARE_PROVIDER_SITE_OTHER): Payer: Medicare Other | Admitting: Internal Medicine

## 2013-12-01 ENCOUNTER — Ambulatory Visit (INDEPENDENT_AMBULATORY_CARE_PROVIDER_SITE_OTHER): Payer: Medicare Other | Admitting: General Practice

## 2013-12-01 VITALS — BP 140/68 | HR 60 | Temp 96.8°F | Wt 147.0 lb

## 2013-12-01 DIAGNOSIS — J45901 Unspecified asthma with (acute) exacerbation: Secondary | ICD-10-CM

## 2013-12-01 DIAGNOSIS — Z7901 Long term (current) use of anticoagulants: Secondary | ICD-10-CM

## 2013-12-01 DIAGNOSIS — I4891 Unspecified atrial fibrillation: Secondary | ICD-10-CM

## 2013-12-01 DIAGNOSIS — Z5181 Encounter for therapeutic drug level monitoring: Secondary | ICD-10-CM

## 2013-12-01 DIAGNOSIS — R0609 Other forms of dyspnea: Secondary | ICD-10-CM

## 2013-12-01 DIAGNOSIS — J8410 Pulmonary fibrosis, unspecified: Secondary | ICD-10-CM

## 2013-12-01 LAB — POCT INR: INR: 2.5

## 2013-12-01 MED ORDER — UMECLIDINIUM-VILANTEROL 62.5-25 MCG/INH IN AEPB: 1.0000 | INHALATION_SPRAY | Freq: Every day | RESPIRATORY_TRACT | Status: DC

## 2013-12-01 MED ORDER — METHYLPREDNISOLONE ACETATE 80 MG/ML IJ SUSP
80.0000 mg | Freq: Once | INTRAMUSCULAR | Status: AC
  Administered 2013-12-01: 80 mg via INTRAMUSCULAR

## 2013-12-01 MED ORDER — ALBUTEROL SULFATE 108 (90 BASE) MCG/ACT IN AEPB: 1.0000 | INHALATION_SPRAY | Freq: Four times a day (QID) | RESPIRATORY_TRACT | Status: DC | PRN

## 2013-12-01 NOTE — Assessment & Plan Note (Signed)
Chronic sx's  Start Anoro 1 qd Proair prn Depomedrol 80 mg IM

## 2013-12-01 NOTE — Progress Notes (Signed)
Pre visit review using our clinic review tool, if applicable. No additional management support is needed unless otherwise documented below in the visit note. 

## 2013-12-01 NOTE — Assessment & Plan Note (Signed)
Continue with current prescription therapy as reflected on the Med list.  

## 2013-12-01 NOTE — Progress Notes (Signed)
   Subjective:    Patient ID: [name], female    DOB: 1932-01-02, 80 y.o.   MRN: [id number]  Shortness of Breath This is a chronic (CPF) problem. The current episode started 1 to 4 weeks ago. The problem occurs constantly. The problem has been gradually worsening. Pertinent negatives include no abdominal pain, chest pain, claudication, headaches, orthopnea, rhinorrhea, sore throat, syncope or wheezing. The symptoms are aggravated by any activity. The patient has no known risk factors for DVT/PE. She has tried beta agonist inhalers for the symptoms. The treatment provided mild relief. There is no history of allergies.      Review of Systems  HENT: Negative for rhinorrhea and sore throat.   Respiratory: Positive for shortness of breath. Negative for wheezing.   Cardiovascular: Negative for chest pain, orthopnea, claudication and syncope.  Gastrointestinal: Negative for abdominal pain.  Neurological: Negative for headaches.       Objective:   Physical Exam  Constitutional: She appears well-developed. She appears distressed.  dyspneic  HENT:  Head: Normocephalic.  Right Ear: External ear normal.  Left Ear: External ear normal.  Nose: Nose normal.  Mouth/Throat: Oropharynx is clear and moist.  Eyes: Conjunctivae are normal. Pupils are equal, round, and reactive to light. Right eye exhibits no discharge. Left eye exhibits no discharge.  Neck: Normal range of motion. Neck supple. No JVD present. No tracheal deviation present. No thyromegaly present.  Cardiovascular: Normal rate, regular rhythm and normal heart sounds.   tachy  Pulmonary/Chest: No stridor. She is in respiratory distress. She has wheezes. She has rales.  Abdominal: Soft. Bowel sounds are normal. She exhibits no distension and no mass. There is no tenderness. There is no rebound and no guarding.  Musculoskeletal: She exhibits no edema and no tenderness.  Lymphadenopathy:    She has no cervical adenopathy.    Neurological: She displays normal reflexes. No cranial nerve deficit. She exhibits normal muscle tone. Coordination normal.  Skin: No rash noted. No erythema.  Psychiatric: She has a normal mood and affect. Her behavior is normal. Judgment and thought content normal.   I personally provided Anoro and Proair respiclick inhalers use teaching. After the teaching patient was able to demonstrate it's use effectively. All questions were answered   A complex cae Chart reviewed     Assessment & Plan:

## 2013-12-01 NOTE — Assessment & Plan Note (Signed)
Depomedrol 80 mg IM  Potential benefits of a short term steroid  use as well as potential risks  and complications were explained to the patient and were aknowledged.

## 2013-12-01 NOTE — Assessment & Plan Note (Signed)
Continue with current prescription therapy as reflected on the Med list. In NSR

## 2013-12-07 ENCOUNTER — Encounter: Payer: Self-pay | Admitting: Internal Medicine

## 2013-12-07 ENCOUNTER — Ambulatory Visit (INDEPENDENT_AMBULATORY_CARE_PROVIDER_SITE_OTHER): Payer: Medicare Other | Admitting: Internal Medicine

## 2013-12-07 VITALS — BP 116/60 | HR 68 | Temp 97.7°F | Wt 146.8 lb

## 2013-12-07 DIAGNOSIS — J8410 Pulmonary fibrosis, unspecified: Secondary | ICD-10-CM

## 2013-12-07 DIAGNOSIS — J209 Acute bronchitis, unspecified: Secondary | ICD-10-CM

## 2013-12-07 MED ORDER — AZITHROMYCIN 250 MG PO TABS: ORAL_TABLET | ORAL | Status: DC

## 2013-12-07 NOTE — Patient Instructions (Signed)
Albuterol is a rescue inhaler which should be used as infrequently as possible; it should never be used more than 1-2 puffs every 4 hours. It may  be used 15-30 minutes before exercise if that is also  a trigger for your asthma.   If it is required more than 2-3 times per week; the asthma is not well controlled. Adverse effects from excess albuterol use  can include health or life threatening heart rhythm irregularities. Marland Kitchen

## 2013-12-07 NOTE — Progress Notes (Signed)
Pre visit review using our clinic review tool, if applicable. No additional management support is needed unless otherwise documented below in the visit note. 

## 2013-12-07 NOTE — Progress Notes (Signed)
   Subjective:    Patient ID: [name], female    DOB: [birth year], 80 y.o.   MRN: [id number]  HPI   She has acute on chronic respiratory compromise over the last month. She completed doxycycline without significant benefit.  She subsequently saw Dr Posey Rea for  the cough and was prescribed Anoroal and given a cortisone injection.  She's had the most benefit from using albuterol. She took 3 puffs back to back. After that she did have a productive cough with some improvement in her symptoms  Her symptoms mainly consist of a nonproductive cough and profound dyspnea on exertion  She had been followed by Dr. Vassie Loll in the past for her pulmonary fibrosis.He had recommended her involvement in a  trial which she declined.    Review of Systems  She denies significant extrinsic symptoms of itchy, watery eyes, sneezing  She has no significant reflux symptoms.  There is no associated signs of rhinosinusitis such as frontal headache, facial pain, or nasal purulence.     Objective:   Physical Exam  Significant or distinguishing  findings on physical exam are documented first.  Below that are other systems examined & findings.  She has juicy rales in all lung fields with inspiratory pops. Heart sounds are somewhat distant. There appears be some splitting the first and second heart sounds. Slight clubbing is suggested of the nailbeds.  General appearance:good health ;well nourished; no acute distress or increased work of breathing is present.  No  lymphadenopathy about the head, neck, or axilla noted.   Eyes: No conjunctival inflammation or lid edema is present. There is no scleral icterus.  Ears:  External ear exam shows no significant lesions or deformities.  Otoscopic examination reveals clear canals, tympanic membranes are intact bilaterally without bulging, retraction, inflammation or discharge.  Nose:  External nasal examination shows no deformity or inflammation. Nasal mucosa  are pink and moist without lesions or exudates. No septal dislocation or deviation.No obstruction to airflow.   Oral exam: Dental hygiene is good; lips and gums are healthy appearing.There is no oropharyngeal erythema or exudate noted.   Neck:  No deformities, thyromegaly, masses, or tenderness noted.      Heart:  Normal rate and regular rhythm without gallop, murmur, click, rub or other extra sounds.   Lungs:No increased work of breathing.    Extremities:  No cyanosis, edema noted    Skin: Warm & dry w/o jaundice or tenting.        Assessment & Plan:  #1 acute bronchitis w/o bronchospasm. Subjectively she feels albuterol was of benefit. This would not be expected to help a restrictive pattern related to #2.  She is requesting a Z-Pak or the discolored secretions. This will be prescribed; but I do not anticipate a significant response to this. I will encourage her to return to Dr. Vassie Loll if that is the case to reconsider involvement in the trial for the pulmonary fibrosis #2 pulmonary fibrosis Plan: See orders and recommendations

## 2013-12-08 ENCOUNTER — Ambulatory Visit: Payer: Medicare Other | Admitting: Internal Medicine

## 2013-12-14 ENCOUNTER — Telehealth: Payer: Self-pay | Admitting: Internal Medicine

## 2013-12-14 ENCOUNTER — Encounter: Payer: Self-pay | Admitting: Internal Medicine

## 2013-12-14 NOTE — Telephone Encounter (Signed)
lmm 11-17-13 for pt to set up past due appt with [facility] for future refills, she did not call, I sent a letter this am to remind her, she happen to call during luch today to set appt because she needs another refill, made her appt 7-31, offered 7-17 but she didn't want am, can she get refill of Lorsartin at [facility] until 7-31?

## 2013-12-17 ENCOUNTER — Encounter (INDEPENDENT_AMBULATORY_CARE_PROVIDER_SITE_OTHER): Payer: Medicare Other | Admitting: Ophthalmology

## 2013-12-17 DIAGNOSIS — H35039 Hypertensive retinopathy, unspecified eye: Secondary | ICD-10-CM

## 2013-12-17 DIAGNOSIS — I10 Essential (primary) hypertension: Secondary | ICD-10-CM

## 2013-12-17 DIAGNOSIS — H3530 Unspecified macular degeneration: Secondary | ICD-10-CM

## 2013-12-17 DIAGNOSIS — H43819 Vitreous degeneration, unspecified eye: Secondary | ICD-10-CM

## 2013-12-17 DIAGNOSIS — H2510 Age-related nuclear cataract, unspecified eye: Secondary | ICD-10-CM

## 2013-12-22 ENCOUNTER — Other Ambulatory Visit: Payer: Self-pay

## 2013-12-22 MED ORDER — LOSARTAN POTASSIUM 25 MG PO TABS: 25.0000 mg | ORAL_TABLET | Freq: Every day | ORAL | Status: DC

## 2013-12-29 ENCOUNTER — Ambulatory Visit (INDEPENDENT_AMBULATORY_CARE_PROVIDER_SITE_OTHER): Payer: Medicare Other | Admitting: General Practice

## 2013-12-29 DIAGNOSIS — Z5181 Encounter for therapeutic drug level monitoring: Secondary | ICD-10-CM

## 2013-12-29 LAB — POCT INR: INR: 1.7

## 2013-12-29 NOTE — Progress Notes (Signed)
Pre visit review using our clinic review tool, if applicable. No additional management support is needed unless otherwise documented below in the visit note. 

## 2014-01-01 ENCOUNTER — Encounter: Payer: Self-pay | Admitting: Internal Medicine

## 2014-01-01 ENCOUNTER — Ambulatory Visit (INDEPENDENT_AMBULATORY_CARE_PROVIDER_SITE_OTHER): Payer: Medicare Other | Admitting: Internal Medicine

## 2014-01-01 VITALS — BP 139/68 | HR 72 | Ht 67.0 in | Wt 146.0 lb

## 2014-01-01 DIAGNOSIS — I480 Paroxysmal atrial fibrillation: Secondary | ICD-10-CM

## 2014-01-01 DIAGNOSIS — I4891 Unspecified atrial fibrillation: Secondary | ICD-10-CM

## 2014-01-01 DIAGNOSIS — I428 Other cardiomyopathies: Secondary | ICD-10-CM

## 2014-01-01 MED ORDER — LOSARTAN POTASSIUM 25 MG PO TABS: 25.0000 mg | ORAL_TABLET | Freq: Every day | ORAL | Status: DC

## 2014-01-01 NOTE — Progress Notes (Signed)
Patient Care Team: Pecola LawlessWilliam F Hopper, MD as PCP - General   HPI  [name] is a 80 y.o. female seen in followup for acutely identify left ventricular dysfunction with left bundle branch block. [name] had diarrhea as a complication of a beta blocker.   [name] is currently being treated with an ACE inhibitor  [name] has a remote history atrial fibrillation with CHADSVASC scroe of >4 prior TIA, gender and age   .   Echocardiogram 5/14 had demonstrated EF of 35%. Myoview done 7/14 demonstrated an EF of 45%. There is no evidence of ischemia.  [name] comes in with complaints of lightheadedness particularly in the morning upon standing and after breakfast and after taking her morning medications.  Her other complaint is significant shortness of breath. [name] has a history of pulmonary fibrosis.      Past Medical History  Diagnosis Date  . Obstructive sleep apnea     CPAP Intolerant  . Cystitis 1974  . Thyroid disease     HYPOTHROIDISM  . DJD (degenerative joint disease)     Dr Kellie Simmeringruslow  . Idiopathic pulmonary fibrosis     IDIOPATHIC vs CHRONIC ERD  . Hyperlipidemia 2005    NMR LIPOPROFILE: LDL 144 (1570/732), HDL 61, TG 103., LDL GOAL = < 130  . Atrial fibrillation     Paroxysmal  . Left bundle branch block     Past Surgical History  Procedure Laterality Date  . Abdominal hysterectomy      Abnormal pap smear; ovaries remaining  . Cholecystectomy  1966  . Appendectomy  1966  . Cystoscopy      Hematuria   . Cardioversion N/A 11/07/2012    Procedure: CARDIOVERSION;  Surgeon: Cassell Clementhomas Brackbill, MD;  [facility];  Service: Cardiovascular;  Laterality: N/A;  . Colonoscopy  2004    negative    Current Outpatient Prescriptions  Medication Sig Dispense Refill  . acetaminophen (TYLENOL) 325 MG tablet Take 325 mg by mouth every 6 (six) hours as needed for pain.      . Albuterol Sulfate (PROAIR RESPICLICK) 108 (90 BASE) MCG/ACT AEPB Inhale 1-2 puffs into the lungs 4  (four) times daily as needed.  1 each  11  . ketoconazole (NIZORAL) 2 % cream Apply 1 application topically 2 (two) times daily.  60 g  0  . levothyroxine (SYNTHROID, LEVOTHROID) 50 MCG tablet TAKE 1 AND 1/2 TABLET BY MOUTH ON MONDAY, WEDNESDAY, FRIDAY, SUNDAY AND TAKE 1 TABLET ON TUESDAY, THURSDAY, AND SATURDAY  45 tablet  5  . losartan (COZAAR) 25 MG tablet Take 1 tablet (25 mg total) by mouth daily.  30 tablet  1  . spironolactone (ALDACTONE) 25 MG tablet Take 1/2 tablet by mouth daily  30 tablet  6  . Umeclidinium-Vilanterol (ANORO ELLIPTA) 62.5-25 MCG/INH AEPB Inhale 1 Act into the lungs daily.  1 each  11  . warfarin (COUMADIN) 5 MG tablet Take as directed by anticoagulation clinic  90 tablet  1   No current facility-administered medications for this visit.    Allergies  Allergen Reactions  . Amoxicillin     REACTION: upset stomach  . Lisinopril     cough  . Metoprolol     diarrhea    Review of Systems negative except from HPI and PMH  Physical Exam BP 139/68  Pulse 72  Ht 5\' 7"  (1.702 m)  Wt 146 lb (66.225 kg)  BMI 22.86 kg/m2 Well developed and well nourished in no  acute distress HENT normal E scleral and icterus clear Neck Supple JVP flat; carotids brisk and full Diffuse crackles n  Regular rate and rhythm, no murmurs gallops or rub Soft with active bowel sounds No clubbing cyanosis none Edema Alert and oriented, grossly normal motor and sensory function Skin Warm and Dry  ECG demonstrates sinus rhythm at 56 Intervals 22/17/43 and an axis less than -49 Left bundle branch block  Assessment and  Plan  Dyspnea on exertion  Pulmonary fibrosis  Nonischemic cardiomyopathy EF 40%  Left bundle branch block  Atrial fibrillation  The patient is on anticoagulation is having only infrequent atrial fibrillation [name] continues to struggle with dyspnea on exertion. [name] has significant abnormalities on physical examination and desaturates into the mid 80s with  walking. I will refer her back to pulmonary as I think he may qualify for oxygen.  In the event that this does not improve the situation, we could potentially consider CRT implantation.  Continue her on anticoagulation.

## 2014-01-01 NOTE — Patient Instructions (Addendum)
Your physician recommends that you continue on your current medications as directed. Please refer to the Current Medication list given to you today.  Lab today: BMET  Your physician wants you to follow-up in: 6 months with Dr. Klein. You will receive a reminder letter in the mail two months in advance. If you don't receive a letter, please call our office to schedule the follow-up appointment.  

## 2014-01-01 NOTE — Addendum Note (Signed)
Addended by: Baird Lyons on: 01/01/2014 04:57 PM   Modules accepted: Orders

## 2014-01-02 LAB — BASIC METABOLIC PANEL
BUN: 22 mg/dL (ref 6–23)
CHLORIDE: 102 meq/L (ref 96–112)
CO2: 25 meq/L (ref 19–32)
Calcium: 9.3 mg/dL (ref 8.4–10.5)
Creat: 1.18 mg/dL — ABNORMAL HIGH (ref 0.50–1.10)
Glucose, Bld: 101 mg/dL — ABNORMAL HIGH (ref 70–99)
Potassium: 4 mEq/L (ref 3.5–5.3)
SODIUM: 139 meq/L (ref 135–145)

## 2014-01-04 ENCOUNTER — Telehealth: Payer: Self-pay | Admitting: Pulmonary Disease

## 2014-01-04 NOTE — Telephone Encounter (Signed)
[name] desaturated to 86% upon walking in the office today. This is better repeatedly observed phenomenon. She qualify for home oxygen.? I have told her that your office will contact her. [name]   She was last seen by [place] dec 2014 & this is a new finding. Pl have her seem me Friday or TP before this to start on O2

## 2014-01-05 NOTE — Telephone Encounter (Signed)
[id number] x1 

## 2014-01-06 NOTE — Telephone Encounter (Signed)
Called spoke w/ pt. She is scheduled to come in and see Dr. Vassie Loll on Friday at 11:15.

## 2014-01-08 ENCOUNTER — Encounter (INDEPENDENT_AMBULATORY_CARE_PROVIDER_SITE_OTHER): Payer: Medicare Other | Admitting: Ophthalmology

## 2014-01-08 ENCOUNTER — Encounter: Payer: Self-pay | Admitting: Pulmonary Disease

## 2014-01-08 ENCOUNTER — Ambulatory Visit (INDEPENDENT_AMBULATORY_CARE_PROVIDER_SITE_OTHER): Payer: Medicare Other | Admitting: Pulmonary Disease

## 2014-01-08 VITALS — BP 124/72 | HR 86 | Temp 98.0°F | Wt 144.6 lb

## 2014-01-08 DIAGNOSIS — J8410 Pulmonary fibrosis, unspecified: Secondary | ICD-10-CM

## 2014-01-08 DIAGNOSIS — H2510 Age-related nuclear cataract, unspecified eye: Secondary | ICD-10-CM

## 2014-01-08 DIAGNOSIS — I10 Essential (primary) hypertension: Secondary | ICD-10-CM

## 2014-01-08 DIAGNOSIS — H3530 Unspecified macular degeneration: Secondary | ICD-10-CM

## 2014-01-08 DIAGNOSIS — H35039 Hypertensive retinopathy, unspecified eye: Secondary | ICD-10-CM

## 2014-01-08 DIAGNOSIS — H43819 Vitreous degeneration, unspecified eye: Secondary | ICD-10-CM

## 2014-01-08 MED ORDER — PREDNISONE 10 MG PO TABS: ORAL_TABLET | ORAL | Status: DC

## 2014-01-08 MED ORDER — AZITHROMYCIN 250 MG PO TABS: ORAL_TABLET | ORAL | Status: DC

## 2014-01-08 NOTE — Patient Instructions (Signed)
Your oxygen levels are better Prednisone 10 mg tabs  Take 2 tabs daily with food x 5ds, then 1 tab daily with food x 5ds then STOP Z-pak x 5 days Schedule PFTs We discussed new medications for pulmonary fibrosis & their side effects

## 2014-01-08 NOTE — Progress Notes (Signed)
   Subjective:    Patient ID: [name], female    DOB: 07-10-1931, 80 y.o.   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 She was diagnosed by Dr. Patsey Berthold in 2006. RA factor was positive, she was seen by Dr. Charlestine Night and he felt that her symptoms were more related to osteoarthritis   Significant tests/ events  She developed proximal atrial fibrillation and underwent DCCV 11/07/12 >>NSR  echo showed EF improved from 35% to 45%.  stress test in 03/2013 with no ischemia.   PSG in 2006 showed mild obstructive sleep apnea with AHI of 10 events per hour in desaturation to 80% with a few periodic limb movements  12/19/12 RA factor 90, CCP neg, ANA neg, ESR 16   CT chest in December 2006 showed bilateral peripheral interstitial disease with honeycombing consistent with IPF. There was also a 10x6 mm density in the right middle lobe . 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   PFTs - FVC 70%, TLC 61% , DLCO 35%, no BD response   01/08/2014  Chief Complaint  Patient presents with  . Follow-up    Pt c/o DOE. pt was 96% upon entering exam room. Occas wheezing/chest tx, prod cough after coughing for a while-yellow phlem. Per pt she has used her proair 3 times in 3 weeks. Pt walked 1 time aorund office and was 97%  returns for follow up of after 8 months She developed increased respiratory symptoms and 6 /2015 She was treated for acute bronchitis,  took zpak (hopper).although she did not have bronchospasm, albuterol seem to offer symptomatic benefit hence was continued. She still reports a hacking cough with - yellow sputum production. BDs do not help She was then seen by Dr. Caryl Comes, oxygen desaturation was documented and she was referred to [place] to start oxygen. However she does not desaturate on walking today,but appears quite dyspneic after her  walk.  Review of Systems     Objective:   Physical Exam  Gen. Pleasant, well-nourished, in no distress, normal affect ENT - no lesions, no post nasal drip Neck: No JVD, no thyromegaly, no carotid bruits Lungs: no use of accessory muscles, no dullness to percussion,bibasal rales 2/3, faint  rhonchi  Cardiovascular: Rhythm regular, heart sounds  normal, no murmurs or gallops, no peripheral edema Abdomen: soft and non-tender, no hepatosplenomegaly, BS normal. Musculoskeletal: No deformities, no cyanosis or clubbing Neuro:  alert, non focal       Assessment & Plan:

## 2014-01-08 NOTE — Assessment & Plan Note (Addendum)
She seems to have had an episode of bronchitis. Your oxygen levels are better, mild bronchospasm still persists. We had a long discussion again about oxygen, she prefers to hold off. Prednisone 10 mg tabs  Take 2 tabs daily with food x 5ds, then 1 tab daily with food x 5ds then STOP Z-pak x 5 days I am not sure that the other long-acting bronchodilators are of any benefit in this setting. Schedule PFTs - if DLCO < 30% , will nt qualify We discussed new medications for pulmonary fibrosis & their side effects. She is concerned about the side effects of nausea and vomiting. We also discussed efficacy of these medications for pulmonary fibrosis

## 2014-01-17 ENCOUNTER — Other Ambulatory Visit: Payer: Self-pay | Admitting: Internal Medicine

## 2014-01-26 ENCOUNTER — Ambulatory Visit (INDEPENDENT_AMBULATORY_CARE_PROVIDER_SITE_OTHER): Payer: Medicare Other | Admitting: *Deleted

## 2014-01-26 DIAGNOSIS — Z5181 Encounter for therapeutic drug level monitoring: Secondary | ICD-10-CM

## 2014-01-26 LAB — POCT INR: INR: 1.8

## 2014-02-24 ENCOUNTER — Ambulatory Visit (INDEPENDENT_AMBULATORY_CARE_PROVIDER_SITE_OTHER): Payer: Medicare Other | Admitting: *Deleted

## 2014-02-24 DIAGNOSIS — Z5181 Encounter for therapeutic drug level monitoring: Secondary | ICD-10-CM

## 2014-02-24 LAB — POCT INR: INR: 2.7

## 2014-03-12 ENCOUNTER — Ambulatory Visit (INDEPENDENT_AMBULATORY_CARE_PROVIDER_SITE_OTHER)
Admission: RE | Admit: 2014-03-12 | Discharge: 2014-03-12 | Disposition: A | Discharge status: Home / Self Care | Payer: Medicare Other | Source: Ambulatory Visit | Attending: Adult Health | Admitting: Adult Health

## 2014-03-12 ENCOUNTER — Encounter: Payer: Self-pay | Admitting: Adult Health

## 2014-03-12 ENCOUNTER — Ambulatory Visit (INDEPENDENT_AMBULATORY_CARE_PROVIDER_SITE_OTHER): Payer: Medicare Other | Admitting: Adult Health

## 2014-03-12 VITALS — BP 110/74 | HR 74 | Temp 97.9°F | Ht 67.0 in | Wt 144.0 lb

## 2014-03-12 DIAGNOSIS — J209 Acute bronchitis, unspecified: Secondary | ICD-10-CM | POA: Insufficient documentation

## 2014-03-12 DIAGNOSIS — J8410 Pulmonary fibrosis, unspecified: Secondary | ICD-10-CM

## 2014-03-12 MED ORDER — LEVALBUTEROL HCL 0.63 MG/3ML IN NEBU
0.6300 mg | INHALATION_SOLUTION | Freq: Once | RESPIRATORY_TRACT | Status: AC
Start: 2014-03-12 — End: 2014-03-12
  Administered 2014-03-12: 0.63 mg via RESPIRATORY_TRACT

## 2014-03-12 MED ORDER — LEVOFLOXACIN 500 MG PO TABS: 500.0000 mg | ORAL_TABLET | Freq: Every day | ORAL | Status: DC

## 2014-03-12 MED ORDER — HYDROCODONE-HOMATROPINE 5-1.5 MG/5ML PO SYRP: 5.0000 mL | ORAL_SOLUTION | Freq: Four times a day (QID) | ORAL | Status: DC | PRN

## 2014-03-12 MED ORDER — PREDNISONE 10 MG PO TABS: ORAL_TABLET | ORAL | Status: DC

## 2014-03-12 NOTE — Progress Notes (Signed)
Subjective:    Patient ID: [name], female    DOB: 1932/05/08, 80 y.o.   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 She was diagnosed by Dr. Patsey Berthold in 2006. RA factor was positive, she was seen by Dr. Charlestine Night and he felt that her symptoms were more related to osteoarthritis   Significant tests/ events  She developed proximal atrial fibrillation and underwent DCCV 11/07/12 >>NSR  echo showed EF improved from 35% to 45%.  stress test in 03/2013 with no ischemia.   PSG in 2006 showed mild obstructive sleep apnea with AHI of 10 events per hour in desaturation to 80% with a few periodic limb movements  12/19/12 RA factor 90, CCP neg, ANA neg, ESR 16   CT chest in December 2006 showed bilateral peripheral interstitial disease with honeycombing consistent with IPF. There was also a 10x6 mm density in the right middle lobe . 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   PFTs - FVC 70%, TLC 61% , DLCO 35%, no BD response    01/08/14  returns for follow up of after 8 months She developed increased respiratory symptoms and 6 /2015 She was treated for acute bronchitis,  took zpak (hopper).although she did not have bronchospasm, albuterol seem to offer symptomatic benefit hence was continued. She still reports a hacking cough with - yellow sputum production. BDs do not help She was then seen by Dr. Caryl Comes, oxygen desaturation was documented and she was referred to [place] to start oxygen. However she does not desaturate on walking today,but appears quite dyspneic after her walk.   03/12/2014 Acute OV  Complains of  DOE, prod cough with green mucus, some wheezing and tightness in chest x3-4 weeks.  Says she has been taking cold meds for her cough and congestion is not working.  No hemoptysis, fever, chest pain, orthopnea, edema Appetite is  good.  Says she is fine at rest but when gets up that is when the dyspnea starts.  Had bronchitis 2 months ago , tx w/ zpack and prednisone says she felts so much better right away.  Requests refill of cough syrup , cough is keeping her up at night.  CXR today shows Underlying emphysema with widespread interstitial fibrosis/ usualinterstitial pneumonitis, with increase thickening overall on the left. Suspect progression of the underlying usual interstitial pneumonitis. Sats on arrival 87% on RA with quick rebound at rest at 93-97% on RA    Review of Systems Constitutional:   No  weight loss, night sweats,  Fevers, chills,  +fatigue, or  lassitude.  HEENT:   No headaches,  Difficulty swallowing,  Tooth/dental problems, or  Sore throat,                No sneezing, itching, ear ache, nasal congestion, post nasal drip,   CV:  No chest pain,  Orthopnea, PND, swelling in lower extremities, anasarca, dizziness, palpitations, syncope.   GI  No heartburn, indigestion, abdominal pain, nausea, vomiting, diarrhea, change in bowel habits, loss of appetite, bloody stools.   Resp:  No chest wall deformity  Skin: no rash or lesions.  GU: no dysuria, change in color of urine, no urgency or frequency.  No flank pain, no hematuria   MS:  No joint pain or swelling.  No decreased range of motion.  No back pain.  Psych:  No  change in mood or affect. No depression or anxiety.  No memory loss.          Objective:   Physical Exam   Gen. Pleasant,  in no distress, normal affect, frail and elderly  ENT - no lesions, no post nasal drip Neck: No JVD, no thyromegaly, no carotid bruits Lungs: no use of accessory muscles, no dullness to percussion,bibasal rales 2/3,  Few faint exp wheezing  Cardiovascular: Rhythm regular, heart sounds  normal, no murmurs or gallops, no peripheral edema Abdomen: soft and non-tender, no hepatosplenomegaly, BS normal. Musculoskeletal: No deformities, no cyanosis or  clubbing Neuro:  alert, non focal       Assessment & Plan:

## 2014-03-12 NOTE — Patient Instructions (Addendum)
Levaquin 500mg  daily for 7 days  Mucinex DM Twice daily  As needed  Cough/congesiton  Prednisone taper down to 20mg  daily -hold at this dose until seen back in office  Please notify coumadin clinic that you are starting on a new antibiotic.  Follow up Dr. Vassie Loll  In 7-10 days and As needed  W/ chest xray  Please contact office for sooner follow up if symptoms do not improve or worsen or seek emergency care

## 2014-03-12 NOTE — Assessment & Plan Note (Signed)
Acute Bronchitis w/ underlying pulmonary fibrosis -suspect her Fibrosis is worsening +/-acute bronchitis Discussed starting Oxygen however she declines and O2 sat at rest ~96-97% talking , walking drops to 87% walking with quick rebound with rest.  Will repeat CXR on return , may need CT scan to evaluate disease progression along with PFT that are planned in future   Plan  Levaquin 500mg  daily for 7 days  Mucinex DM Twice daily  As needed  Cough/congesiton  Prednisone taper down to 20mg  daily -hold at this dose until seen back in office  Please notify coumadin clinic that you are starting on a new antibiotic.  Follow up Dr. Vassie Loll  In 7-10 days and As needed   Please contact office for sooner follow up if symptoms do not improve or worsen or seek emergency care

## 2014-03-12 NOTE — Assessment & Plan Note (Signed)
Suspect flare with probable progression on CXR  Will give brief steroid challenge  Repeat cxr on return  Consider CT scan after next chest xray  Need follow up PFT

## 2014-03-15 NOTE — Progress Notes (Signed)
Reviewed & agree with plan  

## 2014-03-16 ENCOUNTER — Telehealth: Payer: Self-pay | Admitting: Adult Health

## 2014-03-16 NOTE — Telephone Encounter (Signed)
Spoke w pt advised to stop levaquin  Do not take any more of levaquin  She is feeling so much better. Cough and dsypnea are much better.  No loss of strength or gait issues.  Back of foot is sore Advised to call back if not resolving or worsens.  follow up next week and As needed   .Please contact office for sooner follow up if symptoms do not improve or worsen or seek emergency care

## 2014-03-16 NOTE — Telephone Encounter (Signed)
I spoke with the pt spouse and he states that after the pt 3rd dose of levaquin she began to have pain in both of her ankles at the back heel area. The pain has been present x 2 days. She has continued the levaquin, her last dose was this AM. She does feel that her SOB, cough, chest tightness, and wheezing are improving. Pt wanted to know should she continue this medication? Please advise. Carron Curie, CMA  Allergies  Allergen Reactions  . Amoxicillin     REACTION: upset stomach  . Lisinopril     cough  . Metoprolol     diarrhea

## 2014-03-22 ENCOUNTER — Ambulatory Visit (INDEPENDENT_AMBULATORY_CARE_PROVIDER_SITE_OTHER): Payer: Medicare Other | Admitting: Adult Health

## 2014-03-22 ENCOUNTER — Ambulatory Visit (INDEPENDENT_AMBULATORY_CARE_PROVIDER_SITE_OTHER)
Admission: RE | Admit: 2014-03-22 | Discharge: 2014-03-22 | Disposition: A | Discharge status: Home / Self Care | Payer: Medicare Other | Source: Ambulatory Visit | Attending: Adult Health | Admitting: Adult Health

## 2014-03-22 ENCOUNTER — Encounter: Payer: Self-pay | Admitting: Adult Health

## 2014-03-22 VITALS — BP 128/66 | HR 77 | Temp 96.9°F | Ht 67.0 in | Wt 146.2 lb

## 2014-03-22 DIAGNOSIS — J209 Acute bronchitis, unspecified: Secondary | ICD-10-CM

## 2014-03-22 DIAGNOSIS — J8410 Pulmonary fibrosis, unspecified: Secondary | ICD-10-CM

## 2014-03-22 NOTE — Patient Instructions (Addendum)
Prednisone 20mg  daily for 3 days, then 10mg  daily for 1 week then 5mg  daily for 1 week and stop .  Get flu shot today as planned .  Follow up Dr. Vassie Loll  In 6 weeks and As needed   Please contact office for sooner follow up if symptoms do not improve or worsen or seek emergency care

## 2014-03-23 ENCOUNTER — Ambulatory Visit (INDEPENDENT_AMBULATORY_CARE_PROVIDER_SITE_OTHER): Payer: Medicare Other | Admitting: *Deleted

## 2014-03-23 DIAGNOSIS — Z5181 Encounter for therapeutic drug level monitoring: Secondary | ICD-10-CM

## 2014-03-23 LAB — POCT INR: INR: 3

## 2014-03-23 NOTE — Assessment & Plan Note (Signed)
Recent flare with bronchitis -improving   Plan  Prednisone 20mg  daily for 3 days, then 10mg  daily for 1 week then 5mg  daily for 1 week and stop .  Get flu shot today as planned .  Follow up Dr. Vassie Loll  In 6 weeks and As needed   Please contact office for sooner follow up if symptoms do not improve or worsen or seek emergency care

## 2014-03-23 NOTE — Progress Notes (Signed)
Subjective:    Patient ID: [name], female    DOB: 1931/11/20, 80 y.o.   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 She was diagnosed by Dr. Patsey Berthold in 2006. RA factor was positive, she was seen by Dr. Charlestine Night and he felt that her symptoms were more related to osteoarthritis   Significant tests/ events  She developed proximal atrial fibrillation and underwent DCCV 11/07/12 >>NSR  echo showed EF improved from 35% to 45%.  stress test in 03/2013 with no ischemia.   PSG in 2006 showed mild obstructive sleep apnea with AHI of 10 events per hour in desaturation to 80% with a few periodic limb movements  12/19/12 RA factor 90, CCP neg, ANA neg, ESR 16   CT chest in December 2006 showed bilateral peripheral interstitial disease with honeycombing consistent with IPF. There was also a 10x6 mm density in the right middle lobe . 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   PFTs - FVC 70%, TLC 61% , DLCO 35%, no BD response    01/08/14  returns for follow up of after 8 months She developed increased respiratory symptoms and 6 /2015 She was treated for acute bronchitis,  took zpak (hopper).although she did not have bronchospasm, albuterol seem to offer symptomatic benefit hence was continued. She still reports a hacking cough with - yellow sputum production. BDs do not help She was then seen by Dr. Caryl Comes, oxygen desaturation was documented and she was referred to [place] to start oxygen. However she does not desaturate on walking today,but appears quite dyspneic after her walk.   03/12/14  Acute OV  Complains of  DOE, prod cough with green mucus, some wheezing and tightness in chest x3-4 weeks.  Says she has been taking cold meds for her cough and congestion is not working.  No hemoptysis, fever, chest pain, orthopnea, edema Appetite is  good.  Says she is fine at rest but when gets up that is when the dyspnea starts.  Had bronchitis 2 months ago , tx w/ zpack and prednisone says she felts so much better right away.  Requests refill of cough syrup , cough is keeping her up at night.  CXR today shows Underlying emphysema with widespread interstitial fibrosis/ usualinterstitial pneumonitis, with increase thickening overall on the left. Suspect progression of the underlying usual interstitial pneumonitis. Sats on arrival 87% on RA with quick rebound at rest at 93-97% on RA  >tx w/ Levaquin and steroid taper. CXR showed increased /progression of interstitial fibrosis esp on left.   03/22/14 Follow up  Pt returns for 10 day follow up  Seen last ov with fibrosis flare /bronchitis .  cxr with progression on left vs acute process .  She was treated with steroid burst and levaquin  Only able to take 5 days of levaquin as caused her achilles to be sore . Once she stopped symptoms resolve.  Says overall she is improved.  Reports breathing is 80-85% improved since last ov.   Still having some DOE, prod cough with minimal clear/white mucus, some tightness but is much better.  Not as short of breath. Currently on prednisone $RemoveBefor'20mg'[id number]$  daily  No chest pain ,orthopnea, edema  Fever, or n/v/d.    Review of Systems Constitutional:   No  weight loss, night sweats,  Fevers, chills,  +fatigue, or  lassitude.  HEENT:   No headaches,  Difficulty swallowing,  Tooth/dental problems, or  Sore throat,                No sneezing, itching, ear ache, nasal congestion, post nasal drip,   CV:  No chest pain,  Orthopnea, PND, swelling in lower extremities, anasarca, dizziness, palpitations, syncope.   GI  No heartburn, indigestion, abdominal pain, nausea, vomiting, diarrhea, change in bowel habits, loss of appetite, bloody stools.   Resp:  No chest wall deformity  Skin: no rash or lesions.  GU: no dysuria, change in color of urine, no urgency or  frequency.  No flank pain, no hematuria   MS:  No joint pain or swelling.  No decreased range of motion.  No back pain.  Psych:  No change in mood or affect. No depression or anxiety.  No memory loss.          Objective:   Physical Exam   Gen. Pleasant,  in no distress, normal affect, frail and elderly  ENT - no lesions, no post nasal drip Neck: No JVD, no thyromegaly, no carotid bruits Lungs: no use of accessory muscles, no dullness to percussion,bibasal rales 2/3,  No wheezing  Cardiovascular: Rhythm regular, heart sounds  normal, no murmurs or gallops, no peripheral edema Abdomen: soft and non-tender, no hepatosplenomegaly, BS normal. Musculoskeletal: No deformities, no cyanosis or clubbing Neuro:  alert, non focal       Assessment & Plan:

## 2014-03-24 NOTE — Progress Notes (Signed)
Quick Note:  Pt aware of results/recs. ______ 

## 2014-04-12 ENCOUNTER — Other Ambulatory Visit: Payer: Self-pay

## 2014-04-12 MED ORDER — LEVOTHYROXINE SODIUM 50 MCG PO TABS: ORAL_TABLET | ORAL | Status: DC

## 2014-04-15 ENCOUNTER — Telehealth: Payer: Self-pay

## 2014-04-15 NOTE — Telephone Encounter (Signed)
Called and advised patient that she is due for her annual Medicare exam for 2015. I also advised that I noticed an appointment already set for 11/17 at 2:30 for coumadin clinic. I advised that currently there are slots available close to that time with Dr. Alwyn Ren. Per pt she will call our office back after checking with her husband.

## 2014-04-16 ENCOUNTER — Encounter: Payer: Self-pay | Admitting: Internal Medicine

## 2014-04-20 ENCOUNTER — Ambulatory Visit (INDEPENDENT_AMBULATORY_CARE_PROVIDER_SITE_OTHER): Payer: Medicare Other | Admitting: Internal Medicine

## 2014-04-20 ENCOUNTER — Ambulatory Visit (INDEPENDENT_AMBULATORY_CARE_PROVIDER_SITE_OTHER): Payer: Medicare Other

## 2014-04-20 ENCOUNTER — Encounter: Payer: Self-pay | Admitting: Internal Medicine

## 2014-04-20 VITALS — BP 106/56 | HR 77 | Resp 14 | Ht 67.0 in | Wt 144.0 lb

## 2014-04-20 DIAGNOSIS — R739 Hyperglycemia, unspecified: Secondary | ICD-10-CM

## 2014-04-20 DIAGNOSIS — E038 Other specified hypothyroidism: Secondary | ICD-10-CM

## 2014-04-20 DIAGNOSIS — I4891 Unspecified atrial fibrillation: Secondary | ICD-10-CM

## 2014-04-20 DIAGNOSIS — E782 Mixed hyperlipidemia: Secondary | ICD-10-CM

## 2014-04-20 DIAGNOSIS — Z5181 Encounter for therapeutic drug level monitoring: Secondary | ICD-10-CM

## 2014-04-20 DIAGNOSIS — N289 Disorder of kidney and ureter, unspecified: Secondary | ICD-10-CM

## 2014-04-20 LAB — POCT INR: INR: 2.1

## 2014-04-20 NOTE — Assessment & Plan Note (Signed)
A1c

## 2014-04-20 NOTE — Assessment & Plan Note (Signed)
BMET 

## 2014-04-20 NOTE — Assessment & Plan Note (Signed)
Monitor not needed

## 2014-04-20 NOTE — Assessment & Plan Note (Signed)
TSH 

## 2014-04-20 NOTE — Progress Notes (Signed)
Pre visit review using our clinic review tool, if applicable. No additional management support is needed unless otherwise documented below in the visit note. 

## 2014-04-20 NOTE — Patient Instructions (Signed)
Your next office appointment will be determined based upon review of your pending labs . Those instructions will be transmitted to you through the mail

## 2014-04-21 NOTE — Progress Notes (Signed)
   Subjective:    Patient ID: [name], female    DOB: [birth year], 80 y.o.   MRN: [id number]  HPI  She is here to assess active health issues & conditions. PMH, FH, & Social history verified & updated   The Medicare wellness exam was completed by the [facility] nurse at their home this Summer.  She has pulmonary fibrosis; she believes there is no treatment for this. In October she had acute bronchitis and was on Levaquin for 7 days. She believes this caused pain in the right lower extremity. She has been on a prednisone weaning since she was treated for the bronchitis.  She has chronic low back pain which she relates to scoliosis. Dr. Wynetta Emery was told that surgery is not an option  She is unable to exercise due to her advanced lung disease  Her lipids were last checked in 2012. At that time HDL was 80.2 and LDL 98. She's never been on a statin  Review of Systems   She has not changed the mode or her dose of her thyroid supplement. She has no thyroid symptoms except for some dry skin and cold intolerance.  Specifically she denies double vision or hair/nail changes. She also denies anxiety or depression. She has no tremor. There are no changes of constipation or diarrhea.  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 She is on meds from her Ophthalmologist  She has dyspnea with minimal exertion.  Sputum production is variable   She has had random hyperglycemia. She is on the steroids as noted. She denies polyphagia, polydipsia, or polyuria     Objective:   Physical Exam   Positive or pertinent findings include: Small osteoma of the hard palate The fingers are deformed mainly due to deviations at the DIP joints She was dyspneic simply getting on the table. There is a respiratory variation to her heart rhythm and rate. She had diffuse juicy rhonchi as well as rubs & rales diffusely. She has trace-1/2+ ankle edema. Pedal pulses are decreased.  General appearance  :adequately nourished; in no distress. Eyes: No conjunctival inflammation or scleral icterus is present. Oral exam: Dental hygiene is good. Lips and gums are healthy appearing.There is no oropharyngeal erythema or exudate noted.  Heart:   S1 and S2 normal without gallop, murmur, click, rub or other extra sounds   Abdomen: bowel sounds normal, soft and non-tender without masses, organomegaly or hernias noted.  No guarding or rebound. Scoliosis was occult Vascular : all pulses equal ; no bruits present. Skin:Warm & dry.  Intact without suspicious lesions or rashes ; no jaundice or tenting Lymphatic: No lymphadenopathy is noted about the head, neck, axilla          Assessment & Plan:  See Current Assessment & Plan in Problem List under specific Diagnosis

## 2014-04-22 ENCOUNTER — Other Ambulatory Visit (INDEPENDENT_AMBULATORY_CARE_PROVIDER_SITE_OTHER): Payer: Medicare Other

## 2014-04-22 DIAGNOSIS — N289 Disorder of kidney and ureter, unspecified: Secondary | ICD-10-CM

## 2014-04-22 DIAGNOSIS — E038 Other specified hypothyroidism: Secondary | ICD-10-CM

## 2014-04-22 DIAGNOSIS — R739 Hyperglycemia, unspecified: Secondary | ICD-10-CM

## 2014-04-22 DIAGNOSIS — I4891 Unspecified atrial fibrillation: Secondary | ICD-10-CM

## 2014-04-22 LAB — BASIC METABOLIC PANEL
BUN: 21 mg/dL (ref 6–23)
CHLORIDE: 109 meq/L (ref 96–112)
CO2: 26 mEq/L (ref 19–32)
Calcium: 9 mg/dL (ref 8.4–10.5)
Creatinine, Ser: 1.1 mg/dL (ref 0.4–1.2)
GFR: 50.22 mL/min — ABNORMAL LOW (ref >60.00)
Glucose, Bld: 91 mg/dL (ref 70–99)
POTASSIUM: 4.8 meq/L (ref 3.5–5.1)
SODIUM: 141 meq/L (ref 135–145)

## 2014-04-22 LAB — TSH: TSH: 5.43 u[IU]/mL — AB (ref 0.35–4.50)

## 2014-04-22 LAB — HEMOGLOBIN A1C: Hgb A1c MFr Bld: 5.7 % (ref 4.6–6.5)

## 2014-04-26 ENCOUNTER — Telehealth: Payer: Self-pay

## 2014-04-26 ENCOUNTER — Other Ambulatory Visit: Payer: Self-pay | Admitting: Internal Medicine

## 2014-04-26 DIAGNOSIS — E039 Hypothyroidism, unspecified: Secondary | ICD-10-CM

## 2014-04-26 MED ORDER — LOSARTAN POTASSIUM 25 MG PO TABS: 25.0000 mg | ORAL_TABLET | Freq: Every day | ORAL | Status: DC

## 2014-04-26 NOTE — Telephone Encounter (Signed)
Rx sent to pharmacy for 90 day request to help financially, appointment note also added for MD to  discuss medication adherence at next appointment.

## 2014-04-26 NOTE — Telephone Encounter (Signed)
[name] (February 14, 2032)  Losartan 25 mg (82% adherent) o Filled #30 on 01-01-14 and #30 on 04-03-14  Discussed refill history- says that financial barriers prevent her from filling her meds on schedule o She states she gets one check a month o Discussed whether or not a 90 day supply would help in her budgeting- consider changing the Rx to 90 day in an effort to help. Member can always decrease qty at pharmacy level.  Discussed with member the availability of our Social Work team to reach out to her and discuss potential financial assistance- denied this assistance at this time  [employer] of Prevnar 13 vaccination- called customer service to determine coverage- unable to pin point the exact drug under preventative care section o Suggest having billing department do a pre-cert to see if the product will be covered under preventative care at MD office  Empowered member to reach back out to me at direct line should she feel she needs more assistance financially.  ACTION ITEMS: o Place a note in EMR to alert MD of inconsistent refills o Have billing research pre-certification for Prevnar 13 vaccination

## 2014-05-03 ENCOUNTER — Other Ambulatory Visit: Payer: Self-pay | Admitting: Internal Medicine

## 2014-05-04 ENCOUNTER — Encounter: Payer: Self-pay | Admitting: Internal Medicine

## 2014-05-04 DIAGNOSIS — I10 Essential (primary) hypertension: Secondary | ICD-10-CM | POA: Insufficient documentation

## 2014-05-10 ENCOUNTER — Encounter (INDEPENDENT_AMBULATORY_CARE_PROVIDER_SITE_OTHER): Payer: Medicare Other | Admitting: Ophthalmology

## 2014-05-10 DIAGNOSIS — I10 Essential (primary) hypertension: Secondary | ICD-10-CM | POA: Diagnosis not present

## 2014-05-10 DIAGNOSIS — H43813 Vitreous degeneration, bilateral: Secondary | ICD-10-CM

## 2014-05-10 DIAGNOSIS — H3531 Nonexudative age-related macular degeneration: Secondary | ICD-10-CM

## 2014-05-10 DIAGNOSIS — H35033 Hypertensive retinopathy, bilateral: Secondary | ICD-10-CM

## 2014-05-18 ENCOUNTER — Telehealth: Payer: Self-pay | Admitting: Family

## 2014-05-18 ENCOUNTER — Ambulatory Visit (INDEPENDENT_AMBULATORY_CARE_PROVIDER_SITE_OTHER): Payer: Medicare Other

## 2014-05-18 DIAGNOSIS — Z5181 Encounter for therapeutic drug level monitoring: Secondary | ICD-10-CM

## 2014-05-18 LAB — POCT INR: INR: 3.6

## 2014-05-18 NOTE — Telephone Encounter (Signed)
Agree with plan 

## 2014-06-01 ENCOUNTER — Ambulatory Visit (INDEPENDENT_AMBULATORY_CARE_PROVIDER_SITE_OTHER): Payer: Medicare Other | Admitting: Family Medicine

## 2014-06-01 DIAGNOSIS — Z5181 Encounter for therapeutic drug level monitoring: Secondary | ICD-10-CM

## 2014-06-01 LAB — POCT INR: INR: 2.6

## 2014-06-29 ENCOUNTER — Ambulatory Visit (INDEPENDENT_AMBULATORY_CARE_PROVIDER_SITE_OTHER): Payer: Medicare Other

## 2014-06-29 ENCOUNTER — Other Ambulatory Visit (INDEPENDENT_AMBULATORY_CARE_PROVIDER_SITE_OTHER): Payer: Medicare Other

## 2014-06-29 DIAGNOSIS — E039 Hypothyroidism, unspecified: Secondary | ICD-10-CM | POA: Diagnosis not present

## 2014-06-29 DIAGNOSIS — Z5181 Encounter for therapeutic drug level monitoring: Secondary | ICD-10-CM | POA: Diagnosis not present

## 2014-06-29 LAB — TSH: TSH: 4.13 u[IU]/mL (ref 0.35–4.50)

## 2014-06-29 LAB — POCT INR: INR: 2.3

## 2014-07-19 ENCOUNTER — Ambulatory Visit (INDEPENDENT_AMBULATORY_CARE_PROVIDER_SITE_OTHER): Payer: Medicare Other | Admitting: Ophthalmology

## 2014-07-27 ENCOUNTER — Ambulatory Visit (INDEPENDENT_AMBULATORY_CARE_PROVIDER_SITE_OTHER): Payer: Medicare Other | Admitting: General Practice

## 2014-07-27 DIAGNOSIS — Z5181 Encounter for therapeutic drug level monitoring: Secondary | ICD-10-CM

## 2014-07-27 LAB — POCT INR: INR: 3.9

## 2014-07-27 NOTE — Progress Notes (Signed)
Agree with plan 

## 2014-07-27 NOTE — Progress Notes (Signed)
Pre visit review using our clinic review tool, if applicable. No additional management support is needed unless otherwise documented below in the visit note. 

## 2014-08-17 ENCOUNTER — Ambulatory Visit (INDEPENDENT_AMBULATORY_CARE_PROVIDER_SITE_OTHER): Payer: Medicare Other | Admitting: General Practice

## 2014-08-17 DIAGNOSIS — Z5181 Encounter for therapeutic drug level monitoring: Secondary | ICD-10-CM | POA: Diagnosis not present

## 2014-08-17 LAB — POCT INR: INR: 3.2

## 2014-08-17 NOTE — Progress Notes (Signed)
Pre visit review using our clinic review tool, if applicable. No additional management support is needed unless otherwise documented below in the visit note. 

## 2014-08-17 NOTE — Progress Notes (Signed)
Agree with plan 

## 2014-08-18 ENCOUNTER — Telehealth: Payer: Self-pay | Admitting: Adult Health

## 2014-08-18 NOTE — Telephone Encounter (Signed)
Spoke with pt, c/o increased sob Xfew days, nonprod cough.  Denies fever, mucus production, chest pain.  Is requesting appt with TP.  Scheduled for 10:00 tomorrow.  Nothing further needed.

## 2014-08-19 ENCOUNTER — Encounter: Payer: Self-pay | Admitting: Adult Health

## 2014-08-19 ENCOUNTER — Telehealth: Payer: Self-pay | Admitting: Adult Health

## 2014-08-19 ENCOUNTER — Ambulatory Visit (INDEPENDENT_AMBULATORY_CARE_PROVIDER_SITE_OTHER): Payer: Medicare Other | Admitting: Adult Health

## 2014-08-19 VITALS — BP 138/68 | HR 72 | Temp 97.5°F | Ht 67.0 in | Wt 142.4 lb

## 2014-08-19 DIAGNOSIS — J9611 Chronic respiratory failure with hypoxia: Secondary | ICD-10-CM

## 2014-08-19 DIAGNOSIS — J8410 Pulmonary fibrosis, unspecified: Secondary | ICD-10-CM | POA: Diagnosis not present

## 2014-08-19 DIAGNOSIS — J9610 Chronic respiratory failure, unspecified whether with hypoxia or hypercapnia: Secondary | ICD-10-CM | POA: Insufficient documentation

## 2014-08-19 MED ORDER — PREDNISONE 10 MG PO TABS: ORAL_TABLET | ORAL | Status: DC

## 2014-08-19 NOTE — Telephone Encounter (Signed)
Spoke with the pt  She states that she just got home from the pharmacy and did receive msg from [facility]  She is going to call them now to coordinate o2 set up  Nothing further needed

## 2014-08-19 NOTE — Addendum Note (Signed)
Addended by: Julio Sicks on: 08/19/2014 12:41 PM   Modules accepted: Level of Service

## 2014-08-19 NOTE — Progress Notes (Addendum)
Subjective:    Patient ID: [name], female    DOB: 08-Aug-1931, 81 y.o.   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 She was diagnosed by Dr. Patsey Berthold in 2006. RA factor was positive, she was seen by Dr. Charlestine Night and he felt that her symptoms were more related to osteoarthritis   Significant tests/ events  She developed proximal atrial fibrillation and underwent DCCV 11/07/12 >>NSR  echo showed EF improved from 35% to 45%.  stress test in 03/2013 with no ischemia.   PSG in 2006 showed mild obstructive sleep apnea with AHI of 10 events per hour in desaturation to 80% with a few periodic limb movements  12/19/12 RA factor 90, CCP neg, ANA neg, ESR 16   CT chest in December 2006 showed bilateral peripheral interstitial disease with honeycombing consistent with IPF. There was also a 10x6 mm density in the right middle lobe . 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   PFTs - FVC 70%, TLC 61% , DLCO 35%, no BD response    01/08/14  returns for follow up of after 8 months She developed increased respiratory symptoms and 6 /2015 She was treated for acute bronchitis,  took zpak (hopper).although she did not have bronchospasm, albuterol seem to offer symptomatic benefit hence was continued. She still reports a hacking cough with - yellow sputum production. BDs do not help She was then seen by Dr. Caryl Comes, oxygen desaturation was documented and she was referred to [place] to start oxygen. However she does not desaturate on walking today,but appears quite dyspneic after her walk.   03/12/14  Acute OV  Complains of  DOE, prod cough with green mucus, some wheezing and tightness in chest x3-4 weeks.  Says she has been taking cold meds for her cough and congestion is not working.  No hemoptysis, fever, chest pain, orthopnea, edema Appetite is good.   Says she is fine at rest but when gets up that is when the dyspnea starts.  Had bronchitis 2 months ago , tx w/ zpack and prednisone says she felts so much better right away.  Requests refill of cough syrup , cough is keeping her up at night.  CXR today shows Underlying emphysema with widespread interstitial fibrosis/ usualinterstitial pneumonitis, with increase thickening overall on the left. Suspect progression of the underlying usual interstitial pneumonitis. Sats on arrival 87% on RA with quick rebound at rest at 93-97% on RA  >tx w/ Levaquin and steroid taper. CXR showed increased /progression of interstitial fibrosis esp on left.   03/22/14 Follow up  Pt returns for 10 day follow up  Seen last ov with fibrosis flare /bronchitis .  cxr with progression on left vs acute process .  She was treated with steroid burst and levaquin  Only able to take 5 days of levaquin as caused her achilles to be sore . Once she stopped symptoms resolve.  Says overall she is improved.  Reports breathing is 80-85% improved since last ov.   Still having some DOE, prod cough with minimal clear/white mucus, some tightness but is much better.  Not as short of breath. Currently on prednisone 58m daily  No chest pain ,orthopnea, edema  Fever, or n/v/d.   08/19/2014 Acute OV  Complains of ncreased SOB x 2 wks.,wheezing,cough-mainly dry,after coughing a lot clear Denies chest pain,  fcs. , hemoptysis ,  edema  No discolored mucus .  Breathing okay at rest. Any activity she wears out easily  Knows exertional desats, has been reluctant to start on Oxygen in past.  Today continue to desat with minimal activity . O2 sats 87% on RA walking, longer to rebound.  Walk test required 4l/ with walking to keep sats >90%.  She agreed to Oxygen  (very relunctantly)  Says she gets better with steroids for brief time.   cxr 10, 2015 showed progression. .  We discussed repeat CT chest and PFT .  No orthopnea or increased  edema.  Declined Esbriet 2014 .   Review of SystemsConstitutional:   No  weight loss, night sweats,  Fevers, chills,  +fatigue, or  lassitude.  HEENT:   No headaches,  Difficulty swallowing,  Tooth/dental problems, or  Sore throat,                No sneezing, itching, ear ache, nasal congestion, post nasal drip,   CV:  No chest pain,  Orthopnea, PND, swelling in lower extremities, anasarca, dizziness, palpitations, syncope.   GI  No heartburn, indigestion, abdominal pain, nausea, vomiting, diarrhea, change in bowel habits, loss of appetite, bloody stools.   Resp:  No chest wall deformity  Skin: no rash or lesions.  GU: no dysuria, change in color of urine, no urgency or frequency.  No flank pain, no hematuria   MS:  No joint pain or swelling.  No decreased range of motion.  No back pain.  Psych:  No change in mood or affect. No depression or anxiety.  No memory loss.          Objective:   Physical Exam  Gen. Pleasant,  in no distress, normal affect, frail and elderly  ENT - no lesions, no post nasal drip Neck: No JVD, no thyromegaly, no carotid bruits Lungs: no use of accessory muscles, no dullness to percussion,bibasal rales 2/3,  No wheezing  Cardiovascular: Rhythm regular, heart sounds  normal, no murmurs or gallops, no peripheral edema Abdomen: soft and non-tender, no hepatosplenomegaly, BS normal. Musculoskeletal: No deformities, no cyanosis or clubbing Neuro:  alert, non focal       Assessment & Plan:

## 2014-08-19 NOTE — Patient Instructions (Addendum)
Prednisone 40mg  daily for 5 days , 30mg  daily for 5 days then 20mg  daily for 5 days then 10mg  daily for 5 days then stop .  Begin Oxygen 2l/m continuously and 4lm/ with activity .  We are setting you up for a CT chest in 4 weeks  Follow up Dr. Vassie Loll  In 4 weeks with PFT  Please contact office for sooner follow up if symptoms do not improve or worsen or seek emergency care

## 2014-08-19 NOTE — Assessment & Plan Note (Signed)
We discussed this is probably progressive pulmonary fibrosis  She is reluctant but will begin O2 Will have her repeat CT chest and PFT  disucss tx options once again on return  Steroid challenge   Plan  Prednisone 40mg  daily for 5 days , 30mg  daily for 5 days then 20mg  daily for 5 days then 10mg  daily for 5 days then stop .  Begin Oxygen 2l/m continuously and 4lm/ with activity .  We are setting you up for a CT chest in 4 weeks  Follow up Dr. Vassie Loll  In 4 weeks with PFT  Please contact office for sooner follow up if symptoms do not improve or worsen or seek emergency care

## 2014-08-20 ENCOUNTER — Telehealth: Payer: Self-pay | Admitting: Adult Health

## 2014-08-20 ENCOUNTER — Telehealth: Payer: Self-pay

## 2014-08-20 ENCOUNTER — Ambulatory Visit: Payer: Medicare Other | Admitting: Adult Health

## 2014-08-20 DIAGNOSIS — J849 Interstitial pulmonary disease, unspecified: Secondary | ICD-10-CM

## 2014-08-20 NOTE — Progress Notes (Signed)
Reviewed & agree with plan  

## 2014-08-20 NOTE — Telephone Encounter (Signed)
Sorry to hear this  We went over this in great detail  She does need it very badly Explained the dangers of hypoxia Will let Dr. Vassie Loll  Know  Please contact office for sooner follow up if symptoms do not improve or worsen or seek emergency care

## 2014-08-20 NOTE — Telephone Encounter (Signed)
Not able to leave pt message about flu vaccine 

## 2014-08-20 NOTE — Telephone Encounter (Signed)
Spoke with [name], states that pt refused 02 set up.  Pt states she does not need 02.  Signed AMA papers with [facility].    Forwarding to RA and TP to make aware.

## 2014-08-21 ENCOUNTER — Telehealth: Payer: Self-pay | Admitting: Internal Medicine

## 2014-08-23 ENCOUNTER — Other Ambulatory Visit: Payer: Self-pay | Admitting: General Practice

## 2014-08-23 ENCOUNTER — Other Ambulatory Visit: Payer: Self-pay

## 2014-08-23 MED ORDER — WARFARIN SODIUM 5 MG PO TABS: ORAL_TABLET | ORAL | Status: DC

## 2014-08-23 NOTE — Telephone Encounter (Signed)
Patient scheduled to see Dr. Vassie Loll 09/13/2014.  Patient says she would like to have a portable tank, she refused the O2 tanks because they were talking about her staying on one of the big tanks.  She says that she doesn't need a big tank, she needs a tank that she can use just to move around because she is fine when she is sitting down.   FYI to RA

## 2014-08-23 NOTE — Telephone Encounter (Signed)
Ok to provide portable tank

## 2014-08-23 NOTE — Telephone Encounter (Signed)
Pl arrange appt with me over next few weeks to discuss

## 2014-08-23 NOTE — Telephone Encounter (Signed)
Opened in error

## 2014-08-25 ENCOUNTER — Telehealth: Payer: Self-pay | Admitting: Pulmonary Disease

## 2014-08-25 NOTE — Addendum Note (Signed)
Addended by: York Ram on: 08/25/2014 09:49 AM   Modules accepted: Orders

## 2014-08-25 NOTE — Telephone Encounter (Signed)
Order entered for portable oxygen tank.  Patient aware that we are ordering portable tank.  Patient has appointment with Dr. Vassie Loll on 09/13/14.

## 2014-08-25 NOTE — Telephone Encounter (Signed)
Order was put in for patient to have portable oxygen.  Melissa called and says that patient is refusing this again. Patient does not want big concentrator in home.  She says she is fine when she is in her house.  She just wants something small and portable that she can use when she is outside of her home.  Melissa advised her that for the initial setup they put a concentrator in home to refill portable oxygen tanks.  Patient refused and said she will discuss with Dr. Vassie Loll at [facility].   To RA - FYI

## 2014-08-26 NOTE — Telephone Encounter (Signed)
Will discuss on fu OV

## 2014-08-26 NOTE — Telephone Encounter (Signed)
Noted. Nothing further needed. 

## 2014-08-30 ENCOUNTER — Other Ambulatory Visit: Payer: Self-pay

## 2014-08-30 MED ORDER — WARFARIN SODIUM 5 MG PO TABS: 5.0000 mg | ORAL_TABLET | ORAL | Status: DC

## 2014-08-30 NOTE — Telephone Encounter (Signed)
Per records has confirmation from pharmacy will resend...Raechel Chute

## 2014-08-30 NOTE — Telephone Encounter (Signed)
warfarin (COUMADIN) 5 MG tablet [[id number]] n   Pt called in said that [facility] is saying they still dont have this?  Can we call it in again?

## 2014-08-31 ENCOUNTER — Other Ambulatory Visit: Payer: Self-pay | Admitting: General Practice

## 2014-08-31 MED ORDER — WARFARIN SODIUM 5 MG PO TABS: 5.0000 mg | ORAL_TABLET | ORAL | Status: DC

## 2014-09-09 ENCOUNTER — Ambulatory Visit: Payer: Medicare Other | Admitting: Internal Medicine

## 2014-09-13 ENCOUNTER — Ambulatory Visit (INDEPENDENT_AMBULATORY_CARE_PROVIDER_SITE_OTHER): Payer: Medicare Other | Admitting: Pulmonary Disease

## 2014-09-13 ENCOUNTER — Encounter: Payer: Self-pay | Admitting: Pulmonary Disease

## 2014-09-13 VITALS — BP 130/64 | HR 69 | Ht 67.0 in | Wt 145.2 lb

## 2014-09-13 DIAGNOSIS — J9611 Chronic respiratory failure with hypoxia: Secondary | ICD-10-CM

## 2014-09-13 DIAGNOSIS — J8410 Pulmonary fibrosis, unspecified: Secondary | ICD-10-CM

## 2014-09-13 DIAGNOSIS — J84112 Idiopathic pulmonary fibrosis: Secondary | ICD-10-CM

## 2014-09-13 NOTE — Assessment & Plan Note (Signed)
4 L of O2 on exertion with portable concentrator

## 2014-09-13 NOTE — Assessment & Plan Note (Signed)
We once again had a frank discussion about progression of idiopathic pulmonary fibrosis. We discussed anti-fibrotic agents that are now available as FDA approved therapies. She again emphasizes that quality of life is important-she would not be able to tolerate side effects of diarrhea and therefore does not want to pursue these therapies.  I do note that she requires 4 L of oxygen-we will try to get a portable concentrator.  PFTs will be repeated, if DLCO was less than 30% she may not qualify for anti-fibrotic therapies-

## 2014-09-13 NOTE — Progress Notes (Signed)
Subjective:    Patient ID: [name], female    DOB: 10/02/1931, 81 y.o.   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 She was diagnosed by Dr. Patsey Berthold in 2006. RA factor was positive, she was seen by Dr. Charlestine Night and he felt that her symptoms were more related to osteoarthritis   Significant tests/ events  11/2012 She developed proximal atrial fibrillation and underwent DCCV  >>NSR  echo showed EF improved from 35% to 45%.  03/2013 stress test - no ischemia.   PSG in 2006 showed mild obstructive sleep apnea with AHI of 10 events per hour in desaturation to 80% with a few periodic limb movements  12/19/12 RA factor 90, CCP neg, ANA neg, ESR 16   CT chest in December 2006 showed bilateral peripheral interstitial disease with honeycombing consistent with IPF. There was also a 10x6 mm density in the right middle lobe . 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   PFTs 12/2012 - FVC 70%, TLC 61% , DLCO 35%, no BD response   03/12/14 Acute OV >tx w/ Levaquin and steroid taper.    09/13/2014  Chief Complaint  Patient presents with  . Follow-up    patient says she is doing fine when she is sitting still, but when she walks she gets very short of breath.  Patient has Atrial Fib, low back pain, she wonders if she should be on oxygen tank.    She has been noted to desaturate since 01/2014-however she is reluctant to start oxygen. She does not want big oxygen tanks, and requests portable oxygen concentrator. She once again emphasizes that quality of life is important for her She did have a flare in 03/2014 and was treated with Levaquin and steroids with some improvement. Chest x-ray does show progression of interstitial infiltrates  08/19/2014 Acute OV - O2 sats 87% on RA walking, longer to rebound.  Walk test required 4l/ with  walking to keep sats >90%.   Declined Esbriet 2014 .    Past Medical History  Diagnosis Date  . Obstructive sleep apnea     CPAP Intolerant  . Cystitis 1974  . Thyroid disease     HYPOTHROIDISM  . DJD (degenerative joint disease)     Dr Charlestine Night  . Idiopathic pulmonary fibrosis     IDIOPATHIC vs CHRONIC ERD  . Hyperlipidemia 2005    NMR LIPOPROFILE: LDL 144 (1570/732), HDL 61, TG 103., LDL GOAL = < 130  . Atrial fibrillation     Paroxysmal  . Left bundle branch block      Review of Systems neg for any significant sore throat, dysphagia, itching, sneezing, nasal congestion or excess/ purulent secretions, fever, chills, sweats, unintended wt loss, pleuritic or exertional cp, hempoptysis, orthopnea pnd or change in chronic leg swelling. Also denies presyncope, palpitations, heartburn, abdominal pain, nausea, vomiting, diarrhea or change in bowel or urinary habits, dysuria,hematuria, rash, arthralgias, visual complaints, headache, numbness weakness or ataxia.     Objective:   Physical Exam  Gen. Pleasant, well-nourished, in no distress, normal affect ENT - no lesions, no post nasal drip Neck: No JVD, no thyromegaly, no carotid bruits Lungs: no use of accessory muscles, no dullness to percussion, bibasal 1/2 rales , no rhonchi  Cardiovascular: Rhythm regular, heart sounds  normal, no murmurs or gallops, no peripheral edema Abdomen: soft and non-tender, no hepatosplenomegaly,  BS normal. Musculoskeletal: No deformities, no cyanosis or clubbing Neuro:  alert, non focal       Assessment & Plan:

## 2014-09-13 NOTE — Patient Instructions (Signed)
We will try to get you portable concentrator by [employer] - if this does not work, then by [employer] application Refill on albuterol MDI 2 puffs as needed We discussed new medication- OFEV Cancel CT scan Expedite PFt if possible

## 2014-09-14 ENCOUNTER — Ambulatory Visit (INDEPENDENT_AMBULATORY_CARE_PROVIDER_SITE_OTHER): Payer: Medicare Other | Admitting: General Practice

## 2014-09-14 DIAGNOSIS — Z5181 Encounter for therapeutic drug level monitoring: Secondary | ICD-10-CM

## 2014-09-14 LAB — POCT INR: INR: 2.5

## 2014-09-14 NOTE — Progress Notes (Signed)
Pre visit review using our clinic review tool, if applicable. No additional management support is needed unless otherwise documented below in the visit note. 

## 2014-09-14 NOTE — Progress Notes (Signed)
Agree with plan 

## 2014-09-15 DIAGNOSIS — J84112 Idiopathic pulmonary fibrosis: Secondary | ICD-10-CM | POA: Diagnosis not present

## 2014-09-16 ENCOUNTER — Inpatient Hospital Stay: Admission: RE | Admit: 2014-09-16 | Payer: Medicare Other | Source: Ambulatory Visit

## 2014-09-21 ENCOUNTER — Ambulatory Visit (INDEPENDENT_AMBULATORY_CARE_PROVIDER_SITE_OTHER): Payer: Medicare Other | Admitting: Internal Medicine

## 2014-09-21 ENCOUNTER — Encounter: Payer: Self-pay | Admitting: Internal Medicine

## 2014-09-21 VITALS — BP 122/60 | HR 68 | Ht 67.0 in | Wt 144.2 lb

## 2014-09-21 DIAGNOSIS — I428 Other cardiomyopathies: Secondary | ICD-10-CM

## 2014-09-21 DIAGNOSIS — I429 Cardiomyopathy, unspecified: Secondary | ICD-10-CM

## 2014-09-21 MED ORDER — LOSARTAN POTASSIUM 25 MG PO TABS: 25.0000 mg | ORAL_TABLET | Freq: Every day | ORAL | Status: DC

## 2014-09-21 NOTE — Patient Instructions (Signed)
Medication Instructions:  Your physician has recommended you make the following change in your medication:  Increase spironolactone to 25 mg by mouth daily for 2 weeks.  Call us in 2 weeks to let us know how you are feeling. Start Losartan 25 mg by mouth daily.   Labwork: Your physician recommends that you return for lab work in: 2 weeks. (BMET).  Scheduled for Oct 05, 2014. The lab opens at 7:30 AM   Testing/Procedures: none  Follow-Up: Your physician wants you to follow-up in: 6 months.  You will receive a reminder letter in the mail two months in advance. If you don't receive a letter, please call our office to schedule the follow-up appointment.

## 2014-09-21 NOTE — Progress Notes (Signed)
Patient Care Team: Pecola Lawless, MD as PCP - General   HPI  [name] is a 81 y.o. female seen in followup for acutely identify left ventricular dysfunction with left bundle branch block. She had diarrhea as a complication of a beta blocker.   She is currently being treated with an ACE inhibitor  She has a remote history atrial fibrillation with CHADSVASC scroe of >4 prior TIA, gender and age   .   Echocardiogram 5/14 had demonstrated EF of 35%. Myoview done 7/14 demonstrated an EF of 45%. There is no evidence of ischemia.  She comes in with complaints of lightheadedness particularly in the morning upon standing and after breakfast and after taking her morning medications.  Her other complaint is significant shortness of breath. She has a history of pulmonary fibrosis.  She has recently been started on oxygen. She has some problems with peripheral edema. It is not clear to her whether her breathing tracks with her edema or not.      Past Medical History  Diagnosis Date  . Obstructive sleep apnea     CPAP Intolerant  . Cystitis 1974  . Thyroid disease     HYPOTHROIDISM  . DJD (degenerative joint disease)     Dr Kellie Simmering  . Idiopathic pulmonary fibrosis     IDIOPATHIC vs CHRONIC ERD  . Hyperlipidemia 2005    NMR LIPOPROFILE: LDL 144 (1570/732), HDL 61, TG 103., LDL GOAL = < 130  . Atrial fibrillation     Paroxysmal  . Left bundle branch block     Past Surgical History  Procedure Laterality Date  . Abdominal hysterectomy      Abnormal pap smear; ovaries remaining  . Cholecystectomy  1966  . Appendectomy  1966  . Cystoscopy      Hematuria   . Cardioversion N/A 11/07/2012    Procedure: CARDIOVERSION;  Surgeon: Cassell Clement, MD;  Location: [facility] ENDOSCOPY;  Service: Cardiovascular;  Laterality: N/A;  . Colonoscopy  2004    negative    Current Outpatient Prescriptions  Medication Sig Dispense Refill  . acetaminophen (TYLENOL) 325 MG tablet Take 325  mg by mouth every 6 (six) hours as needed for pain.    . Albuterol Sulfate (PROAIR RESPICLICK) 108 (90 BASE) MCG/ACT AEPB Inhale 1-2 puffs into the lungs 4 (four) times daily as needed. 1 each 11  . ketoconazole (NIZORAL) 2 % cream Apply 1 application topically 2 (two) times daily. 60 g 0  . levothyroxine (SYNTHROID, LEVOTHROID) 50 MCG tablet Take 1 tablet by mouth daily.    Marland Kitchen spironolactone (ALDACTONE) 25 MG tablet TAKE ONE-HALF TABLET BY MOUTH DAILY 30 tablet 3  . Umeclidinium-Vilanterol 62.5-25 MCG/INH AEPB Inhale 1 Act into the lungs as needed.    . warfarin (COUMADIN) 5 MG tablet Take 1 tablet (5 mg total) by mouth as directed. Take as directed by anticoagulation clinic 90 tablet 1   No current facility-administered medications for this visit.    Allergies  Allergen Reactions  . Levaquin [Levofloxacin In D5w]     REACTION: leg weakness  . Amoxicillin     REACTION: upset stomach  . Lisinopril     cough  . Metoprolol     diarrhea    Review of Systems negative except from HPI and PMH  Physical Exam BP 122/60 mmHg  Pulse 68  Ht 5\' 7"  (1.702 m)  Wt 144 lb 3.2 oz (65.409 kg)  BMI 22.58 kg/m2 Well developed and well  nourished in no acute distress HENT normal E scleral and icterus clear Neck Supple JVP8-9 carotids brisk and full Diffuse crackles n  Regular rate and rhythm, no murmurs gallops or rub Soft with active bowel sounds No clubbing cyanosis 1+ Edema Alert and oriented, grossly normal motor and sensory function Skin Warm and Dry  ECG demonstrates sinus rhythm at 68\ Intervals 22/17/44 and an axis left  -58 Left bundle branch block  Assessment and  Plan  Dyspnea on exertion  Pulmonary fibrosis  Nonischemic cardiomyopathy EF 40%  Left bundle branch block  Atrial fibrillation -paroxysmal   Congestive heart failure   We will increase her Aldactone from 12.5--25 mg couple of weeks to see if we can't decrease her edema and thereby impact her respiratory  function .  We will resume her losartan , it not being used for blood pressure but rather for her cardiomyopathy in the reduction of the likelihood of the development of symptoms based on the  Save trial   she continues anticoagulation with warfarin

## 2014-10-04 ENCOUNTER — Encounter (INDEPENDENT_AMBULATORY_CARE_PROVIDER_SITE_OTHER): Payer: Medicare Other | Admitting: Ophthalmology

## 2014-10-04 DIAGNOSIS — H3531 Nonexudative age-related macular degeneration: Secondary | ICD-10-CM

## 2014-10-04 DIAGNOSIS — H35033 Hypertensive retinopathy, bilateral: Secondary | ICD-10-CM

## 2014-10-04 DIAGNOSIS — I10 Essential (primary) hypertension: Secondary | ICD-10-CM | POA: Diagnosis not present

## 2014-10-04 DIAGNOSIS — H26492 Other secondary cataract, left eye: Secondary | ICD-10-CM | POA: Diagnosis not present

## 2014-10-04 DIAGNOSIS — H43813 Vitreous degeneration, bilateral: Secondary | ICD-10-CM

## 2014-10-05 ENCOUNTER — Telehealth: Payer: Self-pay | Admitting: *Deleted

## 2014-10-05 ENCOUNTER — Other Ambulatory Visit (INDEPENDENT_AMBULATORY_CARE_PROVIDER_SITE_OTHER): Payer: Medicare Other | Admitting: *Deleted

## 2014-10-05 ENCOUNTER — Encounter: Payer: Self-pay | Admitting: *Deleted

## 2014-10-05 DIAGNOSIS — I429 Cardiomyopathy, unspecified: Secondary | ICD-10-CM

## 2014-10-05 DIAGNOSIS — I428 Other cardiomyopathies: Secondary | ICD-10-CM

## 2014-10-05 LAB — BASIC METABOLIC PANEL
BUN: 27 mg/dL — AB (ref 6–23)
CO2: 26 mEq/L (ref 19–32)
CREATININE: 1.35 mg/dL — AB (ref 0.40–1.20)
Calcium: 9.2 mg/dL (ref 8.4–10.5)
Chloride: 104 mEq/L (ref 96–112)
GFR: 40.02 mL/min — ABNORMAL LOW (ref >60.00)
Glucose, Bld: 93 mg/dL (ref 70–99)
Potassium: 4.4 mEq/L (ref 3.5–5.1)
Sodium: 137 mEq/L (ref 135–145)

## 2014-10-05 NOTE — Telephone Encounter (Signed)
This encounter was created in error - please disregard.

## 2014-10-05 NOTE — Telephone Encounter (Signed)
Patient came to office today for follow up lab work after increasing Spironolactone and starting Losartan.   She reports feeling better and urinating more.  Advised to continue current treatment plan.  Patient verbalized understanding and agreeable to plan.

## 2014-10-08 ENCOUNTER — Other Ambulatory Visit: Payer: Self-pay | Admitting: *Deleted

## 2014-10-08 DIAGNOSIS — R899 Unspecified abnormal finding in specimens from other organs, systems and tissues: Secondary | ICD-10-CM

## 2014-10-08 MED ORDER — SPIRONOLACTONE 25 MG PO TABS: 12.5000 mg | ORAL_TABLET | Freq: Every day | ORAL | Status: DC

## 2014-10-12 ENCOUNTER — Ambulatory Visit (INDEPENDENT_AMBULATORY_CARE_PROVIDER_SITE_OTHER): Payer: Medicare Other | Admitting: Pulmonary Disease

## 2014-10-12 ENCOUNTER — Encounter: Payer: Self-pay | Admitting: Pulmonary Disease

## 2014-10-12 ENCOUNTER — Ambulatory Visit: Payer: Medicare Other

## 2014-10-12 VITALS — BP 90/52 | HR 78 | Ht 66.0 in | Wt 146.0 lb

## 2014-10-12 DIAGNOSIS — J8410 Pulmonary fibrosis, unspecified: Secondary | ICD-10-CM | POA: Diagnosis not present

## 2014-10-12 DIAGNOSIS — J9611 Chronic respiratory failure with hypoxia: Secondary | ICD-10-CM | POA: Diagnosis not present

## 2014-10-12 LAB — PULMONARY FUNCTION TEST
DL/VA % pred: 47 %
DL/VA: 2.38 ml/min/mmHg/L
DLCO unc % pred: 27 %
DLCO unc: 7.44 ml/min/mmHg
FEF 25-75 POST: 3.46 L/s
FEF 25-75 PRE: 2.71 L/s
FEF2575-%Change-Post: 27 %
FEF2575-%PRED-POST: 233 %
FEF2575-%Pred-Pre: 183 %
FEV1-%CHANGE-POST: 4 %
FEV1-%PRED-PRE: 82 %
FEV1-%Pred-Post: 85 %
FEV1-Post: 1.81 L
FEV1-Pre: 1.73 L
FEV1FVC-%Change-Post: 2 %
FEV1FVC-%PRED-PRE: 120 %
FEV6-%CHANGE-POST: 1 %
FEV6-%Pred-Post: 73 %
FEV6-%Pred-Pre: 72 %
FEV6-Post: 1.97 L
FEV6-Pre: 1.94 L
FEV6FVC-%Change-Post: 0 %
FEV6FVC-%PRED-PRE: 105 %
FEV6FVC-%Pred-Post: 104 %
FVC-%Change-Post: 1 %
FVC-%Pred-Post: 70 %
FVC-%Pred-Pre: 68 %
FVC-POST: 1.98 L
FVC-Pre: 1.94 L
POST FEV1/FVC RATIO: 92 %
Post FEV6/FVC ratio: 100 %
Pre FEV1/FVC ratio: 89 %
Pre FEV6/FVC Ratio: 100 %
RV % pred: 61 %
RV: 1.54 L
TLC % PRED: 62 %
TLC: 3.37 L

## 2014-10-12 MED ORDER — ALBUTEROL SULFATE HFA 108 (90 BASE) MCG/ACT IN AERS: 2.0000 | INHALATION_SPRAY | Freq: Four times a day (QID) | RESPIRATORY_TRACT | Status: DC | PRN

## 2014-10-12 MED ORDER — PREDNISONE 10 MG PO TABS: ORAL_TABLET | ORAL | Status: DC

## 2014-10-12 NOTE — Assessment & Plan Note (Signed)
Prednisone 10 mg tabs  Take 2 tabs daily with food x 5ds, then 1 tab daily with food x 5ds then STOP Lung function is low, yet stable OK to use albuterol MDI 2 puffs as needed Think about medication- OFEV - call us back if you are ready to start  

## 2014-10-12 NOTE — Progress Notes (Signed)
PFT done today. 

## 2014-10-12 NOTE — Assessment & Plan Note (Signed)
Portable conc Then pulm rehab

## 2014-10-12 NOTE — Patient Instructions (Signed)
Prednisone 10 mg tabs  Take 2 tabs daily with food x 5ds, then 1 tab daily with food x 5ds then STOP Lung function is low, yet stable OK to use albuterol MDI 2 puffs as needed Think about medication- OFEV - call us back if you are ready to start

## 2014-10-12 NOTE — Progress Notes (Signed)
   Subjective:    Patient ID: AERICA [name], female    DOB: Jan 30, 1932, 81 y.o.   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 She was diagnosed by Dr. Patsey Berthold in 2006. RA factor positive -seen by Dr. Charlestine Night and he felt that her symptoms were more related to osteoarthritis   Significant tests/ events   11/2012 She developed proximal atrial fibrillation and underwent DCCV  >>NSR  echo showed EF improved from 35% to 45%.  03/2013 stress test - no ischemia.   PSG in 2006 showed mild obstructive sleep apnea with AHI of 10 events per hour in desaturation to 80% with a few periodic limb movements  12/19/12 RA factor 90, CCP neg, ANA neg, ESR 16   CT chest in December 2006 showed bilateral peripheral interstitial disease with honeycombing consistent with IPF. There was also a 10x6 mm density in the right middle lobe . 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   PFTs 12/2012 - FVC 70%, TLC 61% , DLCO 35%, no BD response   03/12/14 Acute OV >tx w/ Levaquin and steroid taper.    10/12/2014  Chief Complaint  Patient presents with  . Follow-up    PFT done today.  still doing well when sitting still, exertion causes breathing problems.  received O2 for home but still has not received her light weight tanks. Patient has not read material on OFEV yet, has not decided if she wants to try this yet or not.  Patient wants albuterol inhalers.  She says that the albuterol helps more than anything else.   BP low today -not taking losartan -started for cardiomyopathy Occ wheeze [facility] has applied for portable conc - using O2 at night now PFTs 10/2014 >> FVC 70%, TLC 62% -stable, DLCO slight drop to 27% She has been noted to desaturate since 01/2014-however she was reluctant to start oxygen. She once again emphasizes that quality of life is important  for her She did have a flare in 03/2014 and was treated with Levaquin and steroids with some improvement.  08/19/2014 Acute OV - O2 sats 87% on RA walking, longer to rebound.  Walk test required 4l/ with walking to keep sats >90%.   Review of Systems neg for any significant sore throat, dysphagia, itching, sneezing, nasal congestion or excess/ purulent secretions, fever, chills, sweats, unintended wt loss, pleuritic or exertional cp, hempoptysis, orthopnea pnd or change in chronic leg swelling. Also denies presyncope, palpitations, heartburn, abdominal pain, nausea, vomiting, diarrhea or change in bowel or urinary habits, dysuria,hematuria, rash, arthralgias, visual complaints, headache, numbness weakness or ataxia.     Objective:   Physical Exam  Gen. Pleasant, well-nourished, in no distress, normal affect ENT - no lesions, no post nasal drip Neck: No JVD, no thyromegaly, no carotid bruits Lungs: no use of accessory muscles, no dullness to percussion, BL 1/3 rales , scattered rhonchi  Cardiovascular: Rhythm regular, heart sounds  normal, no murmurs or gallops, no peripheral edema Abdomen: soft and non-tender, no hepatosplenomegaly, BS normal. Musculoskeletal: No deformities, no cyanosis or clubbing Neuro:  alert, non focal        Assessment & Plan:

## 2014-10-15 DIAGNOSIS — J84112 Idiopathic pulmonary fibrosis: Secondary | ICD-10-CM | POA: Diagnosis not present

## 2014-10-19 ENCOUNTER — Other Ambulatory Visit (INDEPENDENT_AMBULATORY_CARE_PROVIDER_SITE_OTHER): Payer: Medicare Other

## 2014-10-19 DIAGNOSIS — R899 Unspecified abnormal finding in specimens from other organs, systems and tissues: Secondary | ICD-10-CM | POA: Diagnosis not present

## 2014-10-19 LAB — BASIC METABOLIC PANEL
BUN: 29 mg/dL — AB (ref 6–23)
CHLORIDE: 105 meq/L (ref 96–112)
CO2: 27 mEq/L (ref 19–32)
Calcium: 9.2 mg/dL (ref 8.4–10.5)
Creatinine, Ser: 1.16 mg/dL (ref 0.40–1.20)
GFR: 47.68 mL/min — ABNORMAL LOW (ref >60.00)
Glucose, Bld: 84 mg/dL (ref 70–99)
Potassium: 4 mEq/L (ref 3.5–5.1)
Sodium: 139 mEq/L (ref 135–145)

## 2014-10-25 ENCOUNTER — Encounter (INDEPENDENT_AMBULATORY_CARE_PROVIDER_SITE_OTHER): Payer: Medicare Other | Admitting: Ophthalmology

## 2014-10-25 DIAGNOSIS — H2702 Aphakia, left eye: Secondary | ICD-10-CM

## 2014-10-27 ENCOUNTER — Ambulatory Visit (INDEPENDENT_AMBULATORY_CARE_PROVIDER_SITE_OTHER): Payer: Medicare Other | Admitting: General Practice

## 2014-10-27 ENCOUNTER — Telehealth: Payer: Self-pay | Admitting: Pulmonary Disease

## 2014-10-27 DIAGNOSIS — Z5181 Encounter for therapeutic drug level monitoring: Secondary | ICD-10-CM

## 2014-10-27 DIAGNOSIS — I4891 Unspecified atrial fibrillation: Secondary | ICD-10-CM | POA: Diagnosis not present

## 2014-10-27 LAB — POCT INR: INR: 2.8

## 2014-10-27 MED ORDER — DOXYCYCLINE HYCLATE 100 MG PO TABS: 100.0000 mg | ORAL_TABLET | Freq: Two times a day (BID) | ORAL | Status: DC

## 2014-10-27 NOTE — Telephone Encounter (Signed)
Patient has cough and sore throat, coughing up green mucus.  Finished Prednisone Sunday.  Requesting something called in.  Allergies  Allergen Reactions  . Levaquin [Levofloxacin In D5w]     REACTION: leg weakness  . Amoxicillin     REACTION: upset stomach  . Lisinopril     cough  . Metoprolol     diarrhea   To BQ in RA absence.

## 2014-10-27 NOTE — Progress Notes (Signed)
I have reviewed and agree with the plan. 

## 2014-10-27 NOTE — Telephone Encounter (Signed)
Per BQ-  Doxy 100 bid X5 days, call in 3 days if she is not improving.    Spoke with pt, she is aware of recs. Sent med to verified pharmacy.  Nothing further needed.

## 2014-10-27 NOTE — Progress Notes (Signed)
Pre visit review using our clinic review tool, if applicable. No additional management support is needed unless otherwise documented below in the visit note. 

## 2014-10-27 NOTE — Telephone Encounter (Signed)
Seen on 5/10.[name]

## 2014-10-29 ENCOUNTER — Telehealth: Payer: Self-pay | Admitting: Pulmonary Disease

## 2014-10-29 MED ORDER — AZITHROMYCIN 250 MG PO TABS: ORAL_TABLET | ORAL | Status: DC

## 2014-10-29 NOTE — Telephone Encounter (Signed)
Patient notified.  Rx sent in.  Nothing further needed.

## 2014-10-29 NOTE — Telephone Encounter (Signed)
Spoke with the pt  She states that doxy we called in for her on 5/25 is making her sick on her stomach  She vomited after her first dose, but continued taking med and states "It just makes me feel icky" She is requesting to stop this and take zpack b/c she knows that she can tolerate this  RA, please advise thanks

## 2014-10-29 NOTE — Telephone Encounter (Signed)
OK to stop doxy Zpak

## 2014-11-08 ENCOUNTER — Telehealth: Payer: Self-pay | Admitting: Pulmonary Disease

## 2014-11-08 MED ORDER — PREDNISONE 10 MG PO TABS: ORAL_TABLET | ORAL | Status: DC

## 2014-11-08 MED ORDER — AZITHROMYCIN 250 MG PO TABS: 250.0000 mg | ORAL_TABLET | ORAL | Status: DC

## 2014-11-08 NOTE — Telephone Encounter (Signed)
Will send to Dr Marchelle Gearing (D.O.D). Please advise thanks.

## 2014-11-08 NOTE — Telephone Encounter (Signed)
Spoke with patient, aware of rec's per MR Pt states that she cannot take Doxy 100 because she gets nauseated  Requests ZPAK or another type. Patient is okay with Pred Taper  Please advise. Thanks.

## 2014-11-08 NOTE — Telephone Encounter (Signed)
Is she allergic to cephalosporins. ? IF not she can try cephalexin 500mg  tid x 5 days (20% x reactgion with amox side effet esp with rash or anpahylaxis but does not typically cause upset stomach)  IF cephalexin wont work, repeat zpak  Ok for pred   Allergies  Allergen Reactions  . Doxycycline Nausea Only  . Levaquin [Levofloxacin In D5w]     REACTION: leg weakness  . Amoxicillin     REACTION: upset stomach  . Lisinopril     cough  . Metoprolol     diarrhea

## 2014-11-08 NOTE — Telephone Encounter (Signed)
Spoke with pt.  She finished zpak x 3 days ago.  States she is still having chest congestion, increased SOB, and cough.  Cough with mostly white to some green mucus.  Wheezing "off and on."  No chest tightness, CP, or f/c/s.  I offered to schedule OV.  Pt doesn't feel she needs to come in given recently seen in May.  She would like to know if RA feels she should repeat zpak.  Dr. Vassie Loll, please advise.  Thank you.  [facility]

## 2014-11-08 NOTE — Telephone Encounter (Signed)
She can try  Take doxycycline 100mg  po twice daily x 5 days; take after meals and avoid sunlight - cuation nausea and vomit so take after food  Also try Please take prednisone 40 mg x1 day, then 30 mg x1 day, then 20 mg x1 day, then 10 mg x1 day, and then 5 mg x1 day and stop (assuming she is not on chronic pred)  Dr. Kalman Shan, M.D., [facility].C.P Pulmonary and Critical Care Medicine Staff Physician [facility] Pager: [phone], If no answer or between  15:00h - 7:00h: call [phone]  11/08/2014 5:35 PM

## 2014-11-08 NOTE — Telephone Encounter (Signed)
Pt aware of rec's per MR- wishes to take the ZPAK to be safe.  ZPAK and Pred Taper called in [facility].  Will send to Dr Vassie Loll as Lorain Childes or treatment in his absence.

## 2014-11-15 DIAGNOSIS — J84112 Idiopathic pulmonary fibrosis: Secondary | ICD-10-CM | POA: Diagnosis not present

## 2014-11-19 ENCOUNTER — Telehealth (HOSPITAL_COMMUNITY): Payer: Self-pay

## 2014-11-19 NOTE — Telephone Encounter (Signed)
I have called and left a message with [facility] to inquire about participation in Pulmonary Rehab per Dr. Reginia Naas referral. Requested patient call back to discuss program in further detail.

## 2014-11-24 ENCOUNTER — Ambulatory Visit (INDEPENDENT_AMBULATORY_CARE_PROVIDER_SITE_OTHER): Payer: Medicare Other | Admitting: General Practice

## 2014-11-24 DIAGNOSIS — Z5181 Encounter for therapeutic drug level monitoring: Secondary | ICD-10-CM

## 2014-11-24 DIAGNOSIS — I4891 Unspecified atrial fibrillation: Secondary | ICD-10-CM

## 2014-11-24 LAB — POCT INR: INR: 1.5

## 2014-11-24 NOTE — Progress Notes (Signed)
Pre visit review using our clinic review tool, if applicable. No additional management support is needed unless otherwise documented below in the visit note. 

## 2014-11-25 ENCOUNTER — Ambulatory Visit (INDEPENDENT_AMBULATORY_CARE_PROVIDER_SITE_OTHER): Payer: Self-pay | Admitting: Ophthalmology

## 2014-11-25 NOTE — Progress Notes (Signed)
I have reviewed and agree with the plan. 

## 2014-12-10 ENCOUNTER — Telehealth (HOSPITAL_COMMUNITY): Payer: Self-pay

## 2014-12-10 NOTE — Telephone Encounter (Signed)
[name] has check with her insurance for coverage for the pulmonary rehab program. [name] has a 50.00 copay per visit which she can't afford. Discussed our maintenance program with her. May be interested.

## 2014-12-15 DIAGNOSIS — J84112 Idiopathic pulmonary fibrosis: Secondary | ICD-10-CM | POA: Diagnosis not present

## 2014-12-21 ENCOUNTER — Ambulatory Visit (INDEPENDENT_AMBULATORY_CARE_PROVIDER_SITE_OTHER): Payer: Medicare Other | Admitting: General Practice

## 2014-12-21 DIAGNOSIS — I4891 Unspecified atrial fibrillation: Secondary | ICD-10-CM

## 2014-12-21 DIAGNOSIS — Z5181 Encounter for therapeutic drug level monitoring: Secondary | ICD-10-CM

## 2014-12-21 LAB — POCT INR: INR: 2

## 2014-12-21 NOTE — Progress Notes (Signed)
Pre visit review using our clinic review tool, if applicable. No additional management support is needed unless otherwise documented below in the visit note. 

## 2014-12-21 NOTE — Progress Notes (Signed)
I have reviewed and agree with the plan. 

## 2014-12-22 ENCOUNTER — Ambulatory Visit: Payer: Medicare Other

## 2014-12-29 ENCOUNTER — Telehealth (HOSPITAL_COMMUNITY): Payer: Self-pay | Admitting: *Deleted

## 2015-01-04 ENCOUNTER — Telehealth (HOSPITAL_COMMUNITY): Payer: Self-pay | Admitting: *Deleted

## 2015-01-11 ENCOUNTER — Telehealth (HOSPITAL_COMMUNITY): Payer: Self-pay | Admitting: *Deleted

## 2015-01-12 ENCOUNTER — Ambulatory Visit (INDEPENDENT_AMBULATORY_CARE_PROVIDER_SITE_OTHER): Payer: Medicare Other | Admitting: Adult Health

## 2015-01-12 ENCOUNTER — Encounter: Payer: Self-pay | Admitting: Adult Health

## 2015-01-12 VITALS — BP 110/62 | HR 72 | Temp 96.4°F | Ht 67.0 in | Wt 142.0 lb

## 2015-01-12 DIAGNOSIS — J9611 Chronic respiratory failure with hypoxia: Secondary | ICD-10-CM | POA: Diagnosis not present

## 2015-01-12 DIAGNOSIS — J8410 Pulmonary fibrosis, unspecified: Secondary | ICD-10-CM

## 2015-01-12 NOTE — Patient Instructions (Signed)
Continue on current regimen  Keep up good work, stay active as tolerated.  Follow up Dr. Vassie Loll  In 3-4 months and As needed

## 2015-01-12 NOTE — Progress Notes (Signed)
   Subjective:    Patient ID: [name], female    DOB: 17-Sep-1931, 81 y.o.   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 She was diagnosed by Dr. Patsey Berthold in 2006. RA factor positive -seen by Dr. Charlestine Night and he felt that her symptoms were more related to osteoarthritis   Significant tests/ events   11/2012 She developed proximal atrial fibrillation and underwent DCCV  >>NSR  echo showed EF improved from 35% to 45%.  03/2013 stress test - no ischemia.   PSG in 2006 showed mild obstructive sleep apnea with AHI of 10 events per hour in desaturation to 80% with a few periodic limb movements  12/19/12 RA factor 90, CCP neg, ANA neg, ESR 16   CT chest in December 2006 showed bilateral peripheral interstitial disease with honeycombing consistent with IPF. There was also a 10x6 mm density in the right middle lobe . 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   PFTs 12/2012 - FVC 70%, TLC 61% , DLCO 35%, no BD response   03/12/14 Acute OV >tx w/ Levaquin and steroid taper.    BP low today -not taking losartan -started for cardiomyopathy Occ wheeze [facility] has applied for portable conc - using O2 at night now PFTs 10/2014 >> FVC 70%, TLC 62% -stable, DLCO slight drop to 27% She has been noted to desaturate since 01/2014-however she was reluctant to start oxygen. She once again emphasizes that quality of life is important for her She did have a flare in 03/2014 and was treated with Levaquin and steroids with some improvement.  08/19/2014 Acute OV - O2 sats 87% on RA walking, longer to rebound.  Walk test required 4l/ with walking to keep sats >90%.   01/12/2015 Follow up : Pulmonary Fibrosis  Pt returns for 3 month follow up  Says she is doing okay .  Gets winded with activity at time and has minimal dry cough and wheezing at times.  Feels she is at her baseline.  No flare of cough . No fever or discolored mucus.  Cant afford pulm rehab. Is going to try some exercises at home. Says she remains active.  PVX and Prevnar utd.  Denies chest pain, orthopnea or edema.    Review of Systems neg for any significant sore throat, dysphagia, itching, sneezing, nasal congestion or excess/ purulent secretions, fever, chills, sweats, unintended wt loss, pleuritic or exertional cp, hempoptysis, orthopnea pnd or change in chronic leg swelling. Also denies presyncope, palpitations, heartburn, abdominal pain, nausea, vomiting, diarrhea or change in bowel or urinary habits, dysuria,hematuria, rash, arthralgias, visual complaints, headache, numbness weakness or ataxia.     Objective:   Physical Exam  Gen. Pleasant, elderly in no distress, normal affect ENT - no lesions, no post nasal drip Neck: No JVD, no thyromegaly, no carotid bruits Lungs: no use of accessory muscles, no dullness to percussion, BL 1/3 rales , scattered rhonchi  Cardiovascular: Rhythm regular, heart sounds  normal, no murmurs or gallops, no peripheral edema Abdomen: soft and non-tender, no hepatosplenomegaly, BS normal. Musculoskeletal: No deformities, no cyanosis or clubbing Neuro:  alert, non focal        Assessment & Plan:

## 2015-01-15 DIAGNOSIS — J84112 Idiopathic pulmonary fibrosis: Secondary | ICD-10-CM | POA: Diagnosis not present

## 2015-01-18 ENCOUNTER — Ambulatory Visit (INDEPENDENT_AMBULATORY_CARE_PROVIDER_SITE_OTHER): Payer: Medicare Other | Admitting: General Practice

## 2015-01-18 DIAGNOSIS — Z5181 Encounter for therapeutic drug level monitoring: Secondary | ICD-10-CM | POA: Diagnosis not present

## 2015-01-18 DIAGNOSIS — I4891 Unspecified atrial fibrillation: Secondary | ICD-10-CM | POA: Diagnosis not present

## 2015-01-18 LAB — POCT INR: INR: 2.3

## 2015-01-18 NOTE — Progress Notes (Signed)
Pre visit review using our clinic review tool, if applicable. No additional management support is needed unless otherwise documented below in the visit note. 

## 2015-01-18 NOTE — Assessment & Plan Note (Signed)
Compensated without flare  Declined [place] /OFEV  Unable to afford pulm rehab  Prev nar and PVX utd   Plan  Continue on current regimen  Keep up good work, stay active as tolerated.  Follow up Dr. Vassie Loll  In 3-4 months and As needed

## 2015-01-18 NOTE — Progress Notes (Signed)
I have reviewed and agree with the plan. 

## 2015-01-18 NOTE — Assessment & Plan Note (Signed)
Compensated on O2  

## 2015-02-15 ENCOUNTER — Ambulatory Visit (INDEPENDENT_AMBULATORY_CARE_PROVIDER_SITE_OTHER): Payer: Medicare Other | Admitting: General Practice

## 2015-02-15 DIAGNOSIS — Z5181 Encounter for therapeutic drug level monitoring: Secondary | ICD-10-CM

## 2015-02-15 DIAGNOSIS — I4891 Unspecified atrial fibrillation: Secondary | ICD-10-CM

## 2015-02-15 DIAGNOSIS — J449 Chronic obstructive pulmonary disease, unspecified: Secondary | ICD-10-CM | POA: Diagnosis not present

## 2015-02-15 DIAGNOSIS — J84112 Idiopathic pulmonary fibrosis: Secondary | ICD-10-CM | POA: Diagnosis not present

## 2015-02-15 LAB — POCT INR: INR: 2.5

## 2015-02-15 NOTE — Progress Notes (Signed)
I have reviewed and agree with the plan. 

## 2015-02-15 NOTE — Progress Notes (Signed)
Pre visit review using our clinic review tool, if applicable. No additional management support is needed unless otherwise documented below in the visit note. 

## 2015-03-03 ENCOUNTER — Telehealth: Payer: Self-pay | Admitting: Internal Medicine

## 2015-03-03 MED ORDER — SPIRONOLACTONE 25 MG PO TABS: 25.0000 mg | ORAL_TABLET | Freq: Every day | ORAL | Status: DC

## 2015-03-03 NOTE — Telephone Encounter (Signed)
I spoke with the patient. She states that she has been on spironolactone 25 mg one tablet daily and this was not updated at the pharmacy. I advised I will update this for her. She also stated since she was put on losartan 25 mg daily, that her BP will often times run in the 70's- low 100's systolic. She feels like she does not have much energy. I advised I will review with Dr. [name] in regards to the losartan. She feels she is doing well on the spironolactone. I will call her back with recommendations. She is agreeable.

## 2015-03-03 NOTE — Telephone Encounter (Signed)
New message      Question about new dosage of spironolactone----pharmacy still using old dosage

## 2015-03-09 ENCOUNTER — Ambulatory Visit (INDEPENDENT_AMBULATORY_CARE_PROVIDER_SITE_OTHER): Payer: Medicare Other | Admitting: Internal Medicine

## 2015-03-09 ENCOUNTER — Encounter: Payer: Self-pay | Admitting: Internal Medicine

## 2015-03-09 ENCOUNTER — Other Ambulatory Visit (INDEPENDENT_AMBULATORY_CARE_PROVIDER_SITE_OTHER): Payer: Medicare Other

## 2015-03-09 VITALS — BP 108/64 | HR 77 | Temp 98.0°F | Resp 20 | Ht 66.5 in | Wt 140.0 lb

## 2015-03-09 DIAGNOSIS — K219 Gastro-esophageal reflux disease without esophagitis: Secondary | ICD-10-CM | POA: Diagnosis not present

## 2015-03-09 DIAGNOSIS — N289 Disorder of kidney and ureter, unspecified: Secondary | ICD-10-CM

## 2015-03-09 DIAGNOSIS — J8410 Pulmonary fibrosis, unspecified: Secondary | ICD-10-CM | POA: Diagnosis not present

## 2015-03-09 DIAGNOSIS — E039 Hypothyroidism, unspecified: Secondary | ICD-10-CM

## 2015-03-09 DIAGNOSIS — Z23 Encounter for immunization: Secondary | ICD-10-CM | POA: Diagnosis not present

## 2015-03-09 LAB — CBC WITH DIFFERENTIAL/PLATELET
BASOS PCT: 0.7 % (ref 0.0–3.0)
Basophils Absolute: 0.1 10*3/uL (ref 0.0–0.1)
EOS PCT: 6.4 % — AB (ref 0.0–5.0)
Eosinophils Absolute: 0.7 10*3/uL (ref 0.0–0.7)
HCT: 45.7 % (ref 36.0–46.0)
Hemoglobin: 15.1 g/dL — ABNORMAL HIGH (ref 12.0–15.0)
LYMPHS ABS: 2.9 10*3/uL (ref 0.7–4.0)
Lymphocytes Relative: 25.1 % (ref 12.0–46.0)
MCHC: 33 g/dL (ref 30.0–36.0)
MCV: 95.9 fl (ref 78.0–100.0)
MONOS PCT: 8.5 % (ref 3.0–12.0)
Monocytes Absolute: 1 10*3/uL (ref 0.1–1.0)
NEUTROS ABS: 6.8 10*3/uL (ref 1.4–7.7)
Neutrophils Relative %: 59.3 % (ref 43.0–77.0)
PLATELETS: 269 10*3/uL (ref 150.0–400.0)
RBC: 4.76 Mil/uL (ref 3.87–5.11)
RDW: 14 % (ref 11.5–15.5)
WBC: 11.5 10*3/uL — ABNORMAL HIGH (ref 4.0–10.5)

## 2015-03-09 LAB — BASIC METABOLIC PANEL
BUN: 22 mg/dL (ref 6–23)
CHLORIDE: 101 meq/L (ref 96–112)
CO2: 25 meq/L (ref 19–32)
Calcium: 9.5 mg/dL (ref 8.4–10.5)
Creatinine, Ser: 1.29 mg/dL — ABNORMAL HIGH (ref 0.40–1.20)
GFR: 42.14 mL/min — ABNORMAL LOW (ref >60.00)
GLUCOSE: 97 mg/dL (ref 70–99)
POTASSIUM: 4.5 meq/L (ref 3.5–5.1)
SODIUM: 138 meq/L (ref 135–145)

## 2015-03-09 LAB — TSH: TSH: 2.68 u[IU]/mL (ref 0.35–4.50)

## 2015-03-09 NOTE — Assessment & Plan Note (Signed)
BMET 

## 2015-03-09 NOTE — Assessment & Plan Note (Signed)
CBC

## 2015-03-09 NOTE — Assessment & Plan Note (Signed)
Encouraged to use oxygen with exertion

## 2015-03-09 NOTE — Patient Instructions (Addendum)
  Your next office appointment will be determined based upon review of your pending labs. Those written interpretation of the lab results and instructions will be transmitted to you by mail for your records.  Critical results will be called.   Followup as needed for any active or acute issue. Please report any significant change in your symptoms. 

## 2015-03-09 NOTE — Assessment & Plan Note (Signed)
TSH 

## 2015-03-09 NOTE — Progress Notes (Signed)
Pre visit review using our clinic review tool, if applicable. No additional management support is needed unless otherwise documented below in the visit note. 

## 2015-03-09 NOTE — Progress Notes (Signed)
   Subjective:    Patient ID: [name], female    DOB: 05-14-1932, 81 y.o.   MRN: [id number]  HPI The patient is here to assess status of active health conditions.  PMH, FH, & Social History reviewed & updated.No change in FH as recorded.  [facility] made a home visit in June of this year.  She stopped her Losartan which was prescribed for cardiomyopathy because of profoundly low blood pressures. Blood pressure was as low as 72/50. Off the medicine the systolics stay below 110 on average. She is on a heart healthy diet.  She is unable to exercise because of her pulmonary fibrosis.The fibrosis is associated with occasional hoarseness and clearing of the throat. Her shortness of breath is chronic but stable. She is on O2 at night. She does not use the oxygen usually when she's out in the course of the day.  She is aged out of colonoscopies. She describes runny stool with some urgency.  She has cold intolerance. She states her legs are cold from the knees down.   She has had a rash in the perineal and intra gluteal areas of the buttocks. She does not believe the Nizoral was of benefit.  Review of Systems  Chest pain, palpitations, tachycardia, paroxysmal nocturnal dyspnea, claudication or edema are absent.  Unexplained weight loss, abdominal pain, significant dyspepsia, dysphagia, melena, rectal bleeding, or persistently small caliber stools are denied.     Objective:   Physical Exam Pertinent or positive findings include: She is thin but appears adequately nourished. She she has bilateral ptosis. She exhibits pursed lip breathing and gasps with minimal exertion. She has coarse, dry rales on inspiration in all lung fields. There is a respiratory variation to the heart rhythm. S4 suggested. Aorta is palpable without aneurysm formation. She exhibited discomfort in the lumbosacral area with reclining. She has accentuated curvature of the upper thoracic spine. She has trace ankle  edema. Pedal pulses are slightly decreased.  General appearance :adequately nourished; in no distress despite respiratory findings.  Eyes: No conjunctival inflammation or scleral icterus is present.  Oral exam:  Lips and gums are healthy appearing.There is no oropharyngeal erythema or exudate noted. Dental hygiene is good.  Heart:  S1 and S2 normal without gallop, murmur, click, rub or other extra sounds    Abdomen: bowel sounds normal, soft and non-tender without masses, organomegaly or hernias noted.  No guarding or rebound.   Vascular : all pulses equal ; no bruits present.  Skin:Warm & dry.  Intact without suspicious lesions or rashes ; no tenting or jaundice . There is mild erythema over the inferior buttocks bilaterally. There is a well-healed decubitus on the right. There is no tissue maceration present.  Lymphatic: No lymphadenopathy is noted about the head, neck, axilla.   Neuro: Strength, tone markedly decreased.    Assessment & Plan:  See Current Assessment & Plan in Problem List under specific Diagnosis

## 2015-03-11 ENCOUNTER — Other Ambulatory Visit: Payer: Self-pay | Admitting: Internal Medicine

## 2015-03-17 DIAGNOSIS — J449 Chronic obstructive pulmonary disease, unspecified: Secondary | ICD-10-CM | POA: Diagnosis not present

## 2015-03-17 DIAGNOSIS — J84112 Idiopathic pulmonary fibrosis: Secondary | ICD-10-CM | POA: Diagnosis not present

## 2015-03-17 NOTE — Telephone Encounter (Signed)
i would contineu the spiro at 25 and stop the losartan

## 2015-03-17 NOTE — Telephone Encounter (Signed)
Not sure if you read this, it was sent to you on 9/29. Just checking to see if any recommendations.

## 2015-03-18 NOTE — Telephone Encounter (Signed)
I spoke with the patient and she is aware that she may stop her losartan per Dr. [name] and continue on spironolactone 25 mg once daily. She verbalizes understanding.

## 2015-03-29 ENCOUNTER — Ambulatory Visit: Payer: Medicare Other

## 2015-03-29 ENCOUNTER — Ambulatory Visit (INDEPENDENT_AMBULATORY_CARE_PROVIDER_SITE_OTHER): Payer: Medicare Other | Admitting: General Practice

## 2015-03-29 DIAGNOSIS — Z5181 Encounter for therapeutic drug level monitoring: Secondary | ICD-10-CM

## 2015-03-29 LAB — POCT INR: INR: 1.7

## 2015-03-29 NOTE — Progress Notes (Signed)
Pre visit review using our clinic review tool, if applicable. No additional management support is needed unless otherwise documented below in the visit note. 

## 2015-03-29 NOTE — Progress Notes (Signed)
I have reviewed and agree with the plan. 

## 2015-04-17 DIAGNOSIS — J449 Chronic obstructive pulmonary disease, unspecified: Secondary | ICD-10-CM | POA: Diagnosis not present

## 2015-04-17 DIAGNOSIS — J84112 Idiopathic pulmonary fibrosis: Secondary | ICD-10-CM | POA: Diagnosis not present

## 2015-05-02 ENCOUNTER — Ambulatory Visit (INDEPENDENT_AMBULATORY_CARE_PROVIDER_SITE_OTHER): Payer: Medicare Other | Admitting: Ophthalmology

## 2015-05-10 ENCOUNTER — Ambulatory Visit (INDEPENDENT_AMBULATORY_CARE_PROVIDER_SITE_OTHER): Payer: Medicare Other | Admitting: General Practice

## 2015-05-10 DIAGNOSIS — Z5181 Encounter for therapeutic drug level monitoring: Secondary | ICD-10-CM

## 2015-05-10 LAB — POCT INR: INR: 2.2

## 2015-05-10 NOTE — Progress Notes (Signed)
Pre visit review using our clinic review tool, if applicable. No additional management support is needed unless otherwise documented below in the visit note. 

## 2015-05-10 NOTE — Progress Notes (Signed)
I have reviewed and agree with the plan. 

## 2015-05-17 DIAGNOSIS — J449 Chronic obstructive pulmonary disease, unspecified: Secondary | ICD-10-CM | POA: Diagnosis not present

## 2015-05-17 DIAGNOSIS — J84112 Idiopathic pulmonary fibrosis: Secondary | ICD-10-CM | POA: Diagnosis not present

## 2015-05-19 ENCOUNTER — Ambulatory Visit (INDEPENDENT_AMBULATORY_CARE_PROVIDER_SITE_OTHER): Payer: Medicare Other | Admitting: Ophthalmology

## 2015-05-20 ENCOUNTER — Ambulatory Visit: Payer: Medicare Other | Admitting: Internal Medicine

## 2015-06-21 ENCOUNTER — Ambulatory Visit: Payer: Medicare Other

## 2015-06-22 ENCOUNTER — Ambulatory Visit (INDEPENDENT_AMBULATORY_CARE_PROVIDER_SITE_OTHER): Payer: Medicare Other | Admitting: Ophthalmology

## 2015-07-06 ENCOUNTER — Ambulatory Visit: Payer: Medicare Other | Admitting: Internal Medicine

## 2015-07-20 ENCOUNTER — Telehealth: Payer: Self-pay | Admitting: Pulmonary Disease

## 2015-07-20 DIAGNOSIS — J8410 Pulmonary fibrosis, unspecified: Secondary | ICD-10-CM

## 2015-07-20 NOTE — Telephone Encounter (Signed)
Attempted to contact pt. No answer, no option to leave a message. Will try back.  

## 2015-07-21 NOTE — Telephone Encounter (Signed)
Order has been updated. Pt is aware. Nothing further was needed.

## 2015-07-21 NOTE — Telephone Encounter (Signed)
Called spoke with pt. She reports she needs [place] to send [facility] an updated order for 02. She reports she uses 3 liters in the daytime and 3 liters qhs. Last orders in chart it states pt is on 4 liters exertion. Dr. Vassie Loll, please advise if okay to send this order? thanks

## 2015-07-21 NOTE — Telephone Encounter (Signed)
Okay 

## 2015-08-02 ENCOUNTER — Ambulatory Visit (INDEPENDENT_AMBULATORY_CARE_PROVIDER_SITE_OTHER): Payer: Medicare Other | Admitting: General Practice

## 2015-08-02 ENCOUNTER — Ambulatory Visit (INDEPENDENT_AMBULATORY_CARE_PROVIDER_SITE_OTHER): Payer: Medicare Other | Admitting: Adult Health

## 2015-08-02 ENCOUNTER — Encounter: Payer: Self-pay | Admitting: Adult Health

## 2015-08-02 VITALS — BP 110/60 | HR 64 | Ht 67.0 in | Wt 134.0 lb

## 2015-08-02 DIAGNOSIS — Z5181 Encounter for therapeutic drug level monitoring: Secondary | ICD-10-CM

## 2015-08-02 DIAGNOSIS — J8410 Pulmonary fibrosis, unspecified: Secondary | ICD-10-CM

## 2015-08-02 DIAGNOSIS — J9611 Chronic respiratory failure with hypoxia: Secondary | ICD-10-CM | POA: Diagnosis not present

## 2015-08-02 DIAGNOSIS — I4891 Unspecified atrial fibrillation: Secondary | ICD-10-CM

## 2015-08-02 LAB — POCT INR: INR: 2.6

## 2015-08-02 MED ORDER — PREDNISONE 10 MG PO TABS: ORAL_TABLET | ORAL | Status: DC

## 2015-08-02 NOTE — Patient Instructions (Addendum)
Prednisone taper over next week.  Order to evaluate your portable concentrator .  Continue on oxygen 3l/m rest and 4l/m with activity.  Follow up Dr. Vassie Loll  In 4 months with chest xray  and As needed

## 2015-08-02 NOTE — Progress Notes (Signed)
Pre visit review using our clinic review tool, if applicable. No additional management support is needed unless otherwise documented below in the visit note. 

## 2015-08-02 NOTE — Progress Notes (Signed)
I have reviewed and agree with the plan. 

## 2015-08-02 NOTE — Progress Notes (Signed)
   Subjective:    Patient ID: [name], female    DOB: 1932-04-29, 82 y.o.   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 She was diagnosed by Dr. Patsey Berthold in 2006. RA factor positive -seen by Dr. Charlestine Night and he felt that her symptoms were more related to osteoarthritis   Significant tests/ events   11/2012 She developed proximal atrial fibrillation and underwent DCCV  >>NSR  echo showed EF improved from 35% to 45%.  03/2013 stress test - no ischemia.   PSG in 2006 showed mild obstructive sleep apnea with AHI of 10 events per hour in desaturation to 80% with a few periodic limb movements  12/19/12 RA factor 90, CCP neg, ANA neg, ESR 16   CT chest in December 2006 showed bilateral peripheral interstitial disease with honeycombing consistent with IPF. There was also a 10x6 mm density in the right middle lobe . 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   PFTs 12/2012 - FVC 70%, TLC 61% , DLCO 35%, no BD response   03/12/14 Acute OV >tx w/ Levaquin and steroid taper.   PFTs 10/2014 >> FVC 70%, TLC 62% -stable, DLCO slight drop to 27% She has been noted to desaturate since 01/2014-however she was reluctant to start oxygen. She once again emphasizes that quality of life is important for her She did have a flare in 03/2014 and was treated with Levaquin and steroids with some improvement.  08/19/2014 Acute OV - O2 sats 87% on RA walking, longer to rebound.  Walk test required 4l/ with walking to keep sats >90%.   08/02/2015 Follow up : Pulmonary Fibrosis  Pt returns for 6 month follow up  Pt c/o SOB with exertion, sinus drainage/congestion at times, occasional dry cough and wheezing. No fever or discolored mucus.  Denies chest tightness/congestion, fever, nausea or vomiting. Cant afford pulm rehab. Is going to try some exercises at  home. Says she remains active.  PVX and Prevnar utd.  Denies chest pain, orthopnea or edema.  Needs order for POC  Discussed cxr today, wants to get on return.    Review of Systems neg for any significant sore throat, dysphagia, itching, sneezing, nasal congestion or excess/ purulent secretions, fever, chills, sweats, unintended wt loss, pleuritic or exertional cp, hempoptysis, orthopnea pnd or change in chronic leg swelling. Also denies presyncope, palpitations, heartburn, abdominal pain, nausea, vomiting, diarrhea or change in bowel or urinary habits, dysuria,hematuria, rash, arthralgias, visual complaints, headache, numbness weakness or ataxia.     Objective:   Physical Exam Filed Vitals:   08/02/15 1517  BP: 110/60  Pulse: 64  Height: '5\' 7"'$  (1.702 m)  Weight: 134 lb (60.782 kg)  SpO2: 93%    Gen. Pleasant, elderly in no distress, normal affect ENT - no lesions, no post nasal drip Neck: No JVD, no thyromegaly, no carotid bruits Lungs: no use of accessory muscles, no dullness to percussion, BL 1/3 rales , scattered rhonchi  Cardiovascular: Rhythm regular, heart sounds  normal, no murmurs or gallops, no peripheral edema Abdomen: soft and non-tender, no hepatosplenomegaly, BS normal. Musculoskeletal: No deformities, no cyanosis or clubbing Neuro:  alert, non focal  Tammy Parrett NP-C  [facility]  08/02/2015        Assessment & Plan:

## 2015-08-08 ENCOUNTER — Telehealth: Payer: Self-pay | Admitting: Adult Health

## 2015-08-08 DIAGNOSIS — J8410 Pulmonary fibrosis, unspecified: Secondary | ICD-10-CM

## 2015-08-08 NOTE — Telephone Encounter (Signed)
Received call from [place] at [facility]. Patient needs [employer]Go Fit POC  [place] needed order entered for [employer] POC Order entered. Mandy notified that order has been entered. Nothing further needed.

## 2015-08-12 NOTE — Assessment & Plan Note (Signed)
  Order to evaluate your portable concentrator .  Continue on oxygen 3l/m rest and 4l/m with activity.  Follow up Dr. Vassie Loll  In 4 months with chest xray  and As needed

## 2015-08-12 NOTE — Assessment & Plan Note (Signed)
Mild flare   Plan  Prednisone taper over next week.  Order to evaluate your portable concentrator .  Continue on oxygen 3l/m rest and 4l/m with activity.  Follow up Dr. Vassie Loll  In 4 months with chest xray  and As needed

## 2015-08-18 NOTE — Progress Notes (Signed)
Reviewed & agree with plan  

## 2015-08-29 ENCOUNTER — Encounter: Payer: Self-pay | Admitting: Internal Medicine

## 2015-08-29 ENCOUNTER — Ambulatory Visit (INDEPENDENT_AMBULATORY_CARE_PROVIDER_SITE_OTHER): Payer: Medicare Other | Admitting: Internal Medicine

## 2015-08-29 VITALS — BP 120/60 | HR 73 | Ht 67.0 in | Wt 131.4 lb

## 2015-08-29 DIAGNOSIS — R0600 Dyspnea, unspecified: Secondary | ICD-10-CM | POA: Diagnosis not present

## 2015-08-29 DIAGNOSIS — I480 Paroxysmal atrial fibrillation: Secondary | ICD-10-CM | POA: Diagnosis not present

## 2015-08-29 DIAGNOSIS — I428 Other cardiomyopathies: Secondary | ICD-10-CM

## 2015-08-29 DIAGNOSIS — I429 Cardiomyopathy, unspecified: Secondary | ICD-10-CM

## 2015-08-29 LAB — BASIC METABOLIC PANEL
BUN: 23 mg/dL (ref 7–25)
CHLORIDE: 103 mmol/L (ref 98–110)
CO2: 28 mmol/L (ref 20–31)
Calcium: 9 mg/dL (ref 8.6–10.4)
Creat: 1.11 mg/dL — ABNORMAL HIGH (ref 0.60–0.88)
GLUCOSE: 74 mg/dL (ref 65–99)
POTASSIUM: 5 mmol/L (ref 3.5–5.3)
Sodium: 142 mmol/L (ref 135–146)

## 2015-08-29 NOTE — Progress Notes (Signed)
Patient Care Team: Pincus Sanes, MD as PCP - General (Internal Medicine)   HPI  [name] is a 82 y.o. female seen in followup for acutely identify left ventricular dysfunction with left bundle branch block. She had diarrhea as a complication of a beta blocker.  She is currently being treated with an ACE inhibitor   She has a remote history atrial fibrillation with CHADSVASC scroe of >4 prior TIA, gender and age   .   Echocardiogram 5/14 had demonstrated EF of 35%. Myoview done 7/14 demonstrated an EF of 45%. There is no evidence of ischemia.     Her other complaint is significant shortness of breath. She has a history of pulmonary fibrosis.  She has recently been started on oxygen. She has some problems with peripheral edema. It is not clear to her whether her breathing tracks with her edema or not.      Past Medical History  Diagnosis Date  . Obstructive sleep apnea     CPAP Intolerant  . Cystitis 1974  . Thyroid disease     HYPOTHROIDISM  . DJD (degenerative joint disease)     Dr Kellie Simmering  . Idiopathic pulmonary fibrosis     IDIOPATHIC vs CHRONIC ERD  . Hyperlipidemia 2005    NMR LIPOPROFILE: LDL 144 (1570/732), HDL 61, TG 103., LDL GOAL = < 130  . Atrial fibrillation (HCC)     Paroxysmal  . Left bundle branch block     Past Surgical History  Procedure Laterality Date  . Abdominal hysterectomy      Abnormal pap smear; ovaries remaining  . Cholecystectomy  1966  . Appendectomy  1966  . Cystoscopy      Hematuria   . Cardioversion N/A 11/07/2012    Procedure: CARDIOVERSION;  Surgeon: Cassell Clement, MD;  Location: [facility] ENDOSCOPY;  Service: Cardiovascular;  Laterality: N/A;  . Colonoscopy  2004    negative    Current Outpatient Prescriptions  Medication Sig Dispense Refill  . acetaminophen (TYLENOL) 325 MG tablet Take 325 mg by mouth every 6 (six) hours as needed for pain.    Marland Kitchen albuterol (PROVENTIL HFA;VENTOLIN HFA) 108 (90 BASE) MCG/ACT inhaler  Inhale 2 puffs into the lungs every 6 (six) hours as needed for wheezing or shortness of breath. 1 Inhaler 11  . ketoconazole (NIZORAL) 2 % cream APPLY  TWICE DAILY 15 g 0  . levothyroxine (SYNTHROID, LEVOTHROID) 50 MCG tablet TAKE 1 AND 1/2 TABLETS BY MOUTH ON MON,WED,FRI AND SUN. TAKE 1 TABLET BY MOUTH ON TUES,THURS, AND SAT. 120 tablet 1  . Multiple Vitamins-Minerals (PRESERVISION AREDS 2 PO) Take 1 tablet by mouth daily.    . predniSONE (DELTASONE) 10 MG tablet 4 tabs for 2 days, then 3 tabs for 2 days, 2 tabs for 2 days, then 1 tab for 2 days, then stop 20 tablet 0  . spironolactone (ALDACTONE) 25 MG tablet Take 1 tablet (25 mg total) by mouth daily. 90 tablet 3  . warfarin (COUMADIN) 5 MG tablet Take 1 tablet (5 mg total) by mouth as directed. Take as directed by anticoagulation clinic 90 tablet 1   No current facility-administered medications for this visit.    Allergies  Allergen Reactions  . Doxycycline Nausea Only  . Levaquin [Levofloxacin In D5w]     REACTION: leg weakness  . Amoxicillin     REACTION: upset stomach  . Lisinopril     cough  . Metoprolol     diarrhea  Review of Systems negative except from HPI and PMH  Physical Exam BP 120/60 mmHg  Pulse 73  Ht 5\' 7"  (1.702 m)  Wt 131 lb 6.4 oz (59.603 kg)  BMI 20.58 kg/m2 Well developed and well nourished in no acute distress HENT normal E scleral and icterus clear Neck Supple JVP8-9 carotids brisk and full Diffuse crackles n  Regular rate and rhythm, no murmurs gallops or rub Soft with active bowel sounds No clubbing cyanosis 1+ Edema Alert and oriented, grossly normal motor and sensory function Skin Warm and Dry  ECG demonstrates sinus rhythm at 68\ Intervals 22/17/42 and an axis left  -69 Left bundle branch block Although they are discordant criteria. She has small Q waves in lead 1 and L but she has not seen V3-V6  Assessment and  Plan  Dyspnea on exertion  Pulmonary fibrosis  Nonischemic  cardiomyopathy EF 40%  Left bundle branch block  Atrial fibrillation -paroxysmal   Congestive heart failure  We will increase her Aldactone from 12.5--25 mg couple of weeks to see if we can't decrease her edema and thereby impact her respiratory function . We will resume her losartan , it not being used for blood pressure but rather for her cardiomyopathy in the reduction of the likelihood of the development of symptoms based on the  Save trial   she continues anticoagulation with warfarin  We had a lengthy discussion regarding the potential benefits of resynchronization given her left bundle branch block. Her left bundle branch block is atypical. There are discordant criteria regarding the diagnosis. A recent paper from [facility] last no treated function duration. The artery visually lower right on the left health evaluation olmesartan (2 hold discussed notching in the anterior precordium as a strong predictor of response. This would've been true despite the small Q waves in lead 1 and L.     In this regard, we will undertake cardiopulmonary stress testing to see whether her limitation of activity is primarily pulmonary or cardiac. In the event of the latter, we will have another discussion regarding CRT. In the event that is the former nothing further will be pursuing this regard  We will check a metabolic profile today on lactone and at the same time we will check a BNP

## 2015-08-29 NOTE — Patient Instructions (Addendum)
Medication Instructions: - Resume cozaar (losartan) 25 mg once daily  Labwork: - Your physician recommends that you have lab work today: bmp/ bnp  Procedures/Testing: - Your physician has recommended that you have a cardiopulmonary stress test (CPX). CPX testing is a non-invasive measurement of heart and lung function. It replaces a traditional treadmill stress test. This type of test provides a tremendous amount of information that relates not only to your present condition but also for future outcomes. This test combines measurements of you ventilation, respiratory gas exchange in the lungs, electrocardiogram (EKG), blood pressure and physical response before, during, and following an exercise protocol.   Follow-Up: - Your physician wants you to follow-up in: 1 year with Dr. [name]. You will receive a reminder letter in the mail two months in advance. If you don't receive a letter, please call our office to schedule the follow-up appointment.  Any Additional Special Instructions Will Be Listed Below (If Applicable).     If you need a refill on your cardiac medications before your next appointment, please call your pharmacy.

## 2015-08-30 ENCOUNTER — Telehealth: Payer: Self-pay | Admitting: Pulmonary Disease

## 2015-08-30 LAB — BRAIN NATRIURETIC PEPTIDE: Brain Natriuretic Peptide: 229.4 pg/mL — ABNORMAL HIGH (ref <100)

## 2015-08-30 MED ORDER — PREDNISONE 10 MG PO TABS: ORAL_TABLET | ORAL | Status: DC

## 2015-08-30 NOTE — Telephone Encounter (Signed)
Called and spoke to pt. Pt was given a pred taper at 2.28.17 OV with TP. Pt states she completed the pred taper and felt improved but then started to feel bad again on 3/26. Pt is now c/o increase in SOB, increase in work of breathing and mild PND. Pt denies CP/tightness, cough, f/c/s, sinus congestion, HA. Pt is requesting a pred taper.   Dr. Vassie Loll, please advise. Thanks.

## 2015-08-30 NOTE — Telephone Encounter (Signed)
Prednisone 10 mg tabs  Take 2 tabs daily with food x 5ds, then 1 tab daily with food x 5ds then STOP If no better, will need OV for recheck if fibrosis getting worse

## 2015-08-30 NOTE — Telephone Encounter (Signed)
Patient notified. Rx sent to pharmacy. Nothing further needed.  

## 2015-09-08 ENCOUNTER — Other Ambulatory Visit: Payer: Self-pay | Admitting: *Deleted

## 2015-09-08 ENCOUNTER — Ambulatory Visit (HOSPITAL_COMMUNITY): Payer: Medicare Other | Attending: Internal Medicine

## 2015-09-08 DIAGNOSIS — I428 Other cardiomyopathies: Secondary | ICD-10-CM

## 2015-09-08 DIAGNOSIS — R0600 Dyspnea, unspecified: Secondary | ICD-10-CM | POA: Diagnosis not present

## 2015-09-09 ENCOUNTER — Ambulatory Visit (INDEPENDENT_AMBULATORY_CARE_PROVIDER_SITE_OTHER): Payer: Medicare Other | Admitting: General Practice

## 2015-09-09 DIAGNOSIS — I4891 Unspecified atrial fibrillation: Secondary | ICD-10-CM

## 2015-09-09 DIAGNOSIS — R0600 Dyspnea, unspecified: Secondary | ICD-10-CM | POA: Diagnosis not present

## 2015-09-09 DIAGNOSIS — Z5181 Encounter for therapeutic drug level monitoring: Secondary | ICD-10-CM | POA: Diagnosis not present

## 2015-09-09 LAB — POCT INR: INR: 2.7

## 2015-09-09 NOTE — Progress Notes (Signed)
Pre visit review using our clinic review tool, if applicable. No additional management support is needed unless otherwise documented below in the visit note. 

## 2015-09-09 NOTE — Progress Notes (Signed)
I have reviewed and agree with the plan. 

## 2015-09-12 ENCOUNTER — Ambulatory Visit (INDEPENDENT_AMBULATORY_CARE_PROVIDER_SITE_OTHER): Payer: Medicare Other | Admitting: Internal Medicine

## 2015-09-12 ENCOUNTER — Encounter: Payer: Self-pay | Admitting: Internal Medicine

## 2015-09-12 ENCOUNTER — Other Ambulatory Visit (INDEPENDENT_AMBULATORY_CARE_PROVIDER_SITE_OTHER): Payer: Medicare Other

## 2015-09-12 VITALS — BP 106/60 | HR 74 | Temp 96.1°F | Resp 22 | Wt 133.0 lb

## 2015-09-12 DIAGNOSIS — J8410 Pulmonary fibrosis, unspecified: Secondary | ICD-10-CM

## 2015-09-12 DIAGNOSIS — I10 Essential (primary) hypertension: Secondary | ICD-10-CM | POA: Diagnosis not present

## 2015-09-12 DIAGNOSIS — E039 Hypothyroidism, unspecified: Secondary | ICD-10-CM | POA: Diagnosis not present

## 2015-09-12 DIAGNOSIS — I4891 Unspecified atrial fibrillation: Secondary | ICD-10-CM | POA: Diagnosis not present

## 2015-09-12 DIAGNOSIS — N289 Disorder of kidney and ureter, unspecified: Secondary | ICD-10-CM

## 2015-09-12 DIAGNOSIS — I429 Cardiomyopathy, unspecified: Secondary | ICD-10-CM

## 2015-09-12 DIAGNOSIS — I428 Other cardiomyopathies: Secondary | ICD-10-CM

## 2015-09-12 DIAGNOSIS — M4120 Other idiopathic scoliosis, site unspecified: Secondary | ICD-10-CM

## 2015-09-12 LAB — TSH: TSH: 3.42 u[IU]/mL (ref 0.35–4.50)

## 2015-09-12 LAB — BASIC METABOLIC PANEL
BUN: 27 mg/dL — AB (ref 6–23)
CHLORIDE: 104 meq/L (ref 96–112)
CO2: 28 meq/L (ref 19–32)
CREATININE: 1.21 mg/dL — AB (ref 0.40–1.20)
Calcium: 9.1 mg/dL (ref 8.4–10.5)
GFR: 45.31 mL/min — ABNORMAL LOW (ref >60.00)
Glucose, Bld: 96 mg/dL (ref 70–99)
POTASSIUM: 4.4 meq/L (ref 3.5–5.1)
Sodium: 141 mEq/L (ref 135–145)

## 2015-09-12 MED ORDER — NYSTATIN 100000 UNIT/GM EX POWD: CUTANEOUS | Status: DC

## 2015-09-12 MED ORDER — LEVOTHYROXINE SODIUM 50 MCG PO TABS: 50.0000 ug | ORAL_TABLET | Freq: Every day | ORAL | Status: DC

## 2015-09-12 NOTE — Assessment & Plan Note (Signed)
On warfarin Rate controlled

## 2015-09-12 NOTE — Progress Notes (Signed)
Subjective:    Patient ID: [name], female    DOB: July 14, 1931, 82 y.o.   MRN: [id number]  HPI She is here to establish with a new pcp.    Hypothyroidism:  She is taking her medication daily.  She denies any recent changes in energy or weight that are unexplained.   Hypertension, Afib, nonischemic cardiomyopathy: she is following with cardiology.  She is taking her medication daily. She is compliant with a low sodium diet.  She denies chest pain, lightheadedness, and regular headaches. She is not exercising regularly.  She does not monitor her blood pressure at home.    Pulmonary fibrosis:  She gets SOB with exertion.  She denies SOB at rest.  She has flares with allergies and URI's.  She uses oxygen at night and with moderate exertion.  She is on prednisone twice in the past couple of months for flares related to allergies. Prednisone that she recently did cause some ankle swelling, but packs has improved. She is following with pulmonary.  Renal insufficiency:She does have a history of some mild renal insufficiency. She is taking all her medications daily as prescribed.  Candida:  Area on her buttock region that has been present for a while. It comes and goes. She was diagnosed with candida in the past and has been using ketoconazole cream. This seems to be less effective and she wonders about trying something different.     Medications and allergies reviewed with patient and updated if appropriate.  Patient Active Problem List   Diagnosis Date Noted  . Chronic respiratory failure (HCC) 08/19/2014  . Essential hypertension 05/04/2014  . Hyperglycemia 04/20/2014  . Encounter for therapeutic drug monitoring 07/08/2013  . Renal insufficiency, mild 04/21/2013  . Nonischemic cardiomyopathy (HCC) 11/28/2012  . Anticoagulant long-term use 10/10/2010  . Left bundle branch block   . PULMONARY FIBROSIS 04/20/2009  . GERD 04/20/2009  . EOSINOPHILIA 02/03/2009  . Atrial fibrillation  (HCC) 01/18/2009  . Hypothyroidism 07/14/2007  . HYPERLIPIDEMIA 07/14/2007    Current Outpatient Prescriptions on File Prior to Visit  Medication Sig Dispense Refill  . acetaminophen (TYLENOL) 325 MG tablet Take 325 mg by mouth every 6 (six) hours as needed for pain.    Marland Kitchen albuterol (PROVENTIL HFA;VENTOLIN HFA) 108 (90 BASE) MCG/ACT inhaler Inhale 2 puffs into the lungs every 6 (six) hours as needed for wheezing or shortness of breath. 1 Inhaler 11  . ketoconazole (NIZORAL) 2 % cream APPLY  TWICE DAILY 15 g 0  . levothyroxine (SYNTHROID, LEVOTHROID) 50 MCG tablet TAKE 1 AND 1/2 TABLETS BY MOUTH ON MON,WED,FRI AND SUN. TAKE 1 TABLET BY MOUTH ON TUES,THURS, AND SAT. 120 tablet 1  . losartan (COZAAR) 25 MG tablet Take 1 tablet (25 mg total) by mouth daily.    . Multiple Vitamins-Minerals (PRESERVISION AREDS 2 PO) Take 1 tablet by mouth daily.    . predniSONE (DELTASONE) 10 MG tablet Take 2 tablets with food x 5 days, then 1 tablet daily x 5 days, then STOP 15 tablet 0  . spironolactone (ALDACTONE) 25 MG tablet Take 1 tablet (25 mg total) by mouth daily. 90 tablet 3  . warfarin (COUMADIN) 5 MG tablet Take 1 tablet (5 mg total) by mouth as directed. Take as directed by anticoagulation clinic 90 tablet 1   No current facility-administered medications on file prior to visit.    Past Medical History  Diagnosis Date  . Obstructive sleep apnea     CPAP Intolerant  .  Cystitis 1974  . Thyroid disease     HYPOTHROIDISM  . DJD (degenerative joint disease)     Dr Kellie Simmering  . Idiopathic pulmonary fibrosis     IDIOPATHIC vs CHRONIC ERD  . Hyperlipidemia 2005    NMR LIPOPROFILE: LDL 144 (1570/732), HDL 61, TG 103., LDL GOAL = < 130  . Atrial fibrillation (HCC)     Paroxysmal  . Left bundle branch block     Past Surgical History  Procedure Laterality Date  . Abdominal hysterectomy      Abnormal pap smear; ovaries remaining  . Cholecystectomy  1966  . Appendectomy  1966  . Cystoscopy       Hematuria   . Cardioversion N/A 11/07/2012    Procedure: CARDIOVERSION;  Surgeon: Cassell Clement, MD;  Location: [facility] ENDOSCOPY;  Service: Cardiovascular;  Laterality: N/A;  . Colonoscopy  2004    negative    Social History   Social History  . Marital Status: Married    Spouse Name: N/A  . Number of Children: 1  . Years of Education: N/A   Occupational History  . Retired     [employer]   Social History Main Topics  . Smoking status: Former Smoker -- 0.25 packs/day for 30 years    Types: Cigarettes    Quit date: 06/04/1965  . Smokeless tobacco: Never Used     Comment: smoked 1953-1967, up to < 1/4 ppd  . Alcohol Use: Yes     Comment: Socially only  . Drug Use: No  . Sexual Activity: Not Asked   Other Topics Concern  . None   Social History Narrative   NO REGULAR EXERCISE   11/03/09 [employer] SIGNED APPOINTING HUSBAND JAMES Antosh; OK TO LEAVE MESSAGE ON HOME # [id number]    Family History  Problem Relation Age of Onset  . Coronary artery disease Father   . Transient ischemic attack Father   . Stroke Father 66  . Asthma Mother   . Cancer Daughter     R breast; S/P mastectomy  . Diabetes Neg Hx     Review of Systems  Constitutional: Positive for fatigue. Negative for fever and chills.  Respiratory: Positive for shortness of breath (with exertion) and wheezing (occasionally). Negative for cough.   Cardiovascular: Positive for palpitations (can feel irregularly) and leg swelling (only with prednisone). Negative for chest pain.  Gastrointestinal: Negative for nausea and abdominal pain.       No gerd  Neurological: Positive for headaches (occasional). Negative for dizziness and light-headedness.       Objective:   Filed Vitals:   09/12/15 1339  BP: 106/60  Pulse: 74  Temp: 96.1 F (35.6 C)  Resp: 22   Filed Weights   09/12/15 1339  Weight: 133 lb (60.328 kg)   Body mass index is 20.83 kg/(m^2).   Physical Exam Constitutional: Appears  well-developed and well-nourished. No distress.  Neck: Neck supple. No tracheal deviation present. No thyromegaly present.  No carotid bruit. No cervical adenopathy.   Cardiovascular: Normal rate, regular rhythm and normal heart sounds.   No murmur heard.  1+ bilateral ankle edema Pulmonary/Chest: Effort normal. Tachypnea with movement or exertion. No respiratory distress. No wheezes. Dry crackles throughout the lungs.    Assessment & Plan:   See Problem List for Assessment and Plan of chronic medical problems.  Follow-up annually, sooner if needed

## 2015-09-12 NOTE — Patient Instructions (Addendum)
  Test(s) ordered today. Your results will be released to MyChart (or called to you) after review, usually within 72hours after test completion. If any changes need to be made, you will be notified at that same time.  All other Health Maintenance issues reviewed.   All recommended immunizations and age-appropriate screenings are up-to-date or discussed.  No immunizations administered today.   Medications reviewed and updated.  No changes recommended at this time.   Please followup annually

## 2015-09-12 NOTE — Assessment & Plan Note (Signed)
Has chronic lower back pain

## 2015-09-12 NOTE — Assessment & Plan Note (Addendum)
Blood pressure controlled - on low side On current medications to help improve heart function more than for blood pressure

## 2015-09-12 NOTE — Assessment & Plan Note (Signed)
Following with pulmonary Reason prednisone for mild flare-symptoms at baseline Residual ankle edema from prednisone Using oxygen 3 L/m at rest and 4 L per minute with activity

## 2015-09-12 NOTE — Assessment & Plan Note (Signed)
Check BMP Monitor kidney function

## 2015-09-12 NOTE — Progress Notes (Signed)
Pre visit review using our clinic review tool, if applicable. No additional management support is needed unless otherwise documented below in the visit note. 

## 2015-09-12 NOTE — Assessment & Plan Note (Signed)
EF 35% 2014

## 2015-09-12 NOTE — Assessment & Plan Note (Signed)
Check tsh  Titrate med dose if needed  

## 2015-09-14 ENCOUNTER — Telehealth: Payer: Self-pay | Admitting: Emergency Medicine

## 2015-09-14 NOTE — Telephone Encounter (Signed)
Spoke with pt to inform.  

## 2015-09-14 NOTE — Telephone Encounter (Signed)
Pt would like to know if you will send in a fungal cream that she dicussed with you at her recent visit. Please advise.

## 2015-09-14 NOTE — Telephone Encounter (Signed)
I sent in nystatin for her to try.  If it is not effective she should let me know

## 2015-10-13 ENCOUNTER — Ambulatory Visit (INDEPENDENT_AMBULATORY_CARE_PROVIDER_SITE_OTHER): Payer: Medicare Other | Admitting: Internal Medicine

## 2015-10-13 ENCOUNTER — Encounter: Payer: Self-pay | Admitting: Internal Medicine

## 2015-10-13 VITALS — BP 110/61 | HR 74 | Ht 67.0 in | Wt 131.8 lb

## 2015-10-13 DIAGNOSIS — R0600 Dyspnea, unspecified: Secondary | ICD-10-CM

## 2015-10-13 DIAGNOSIS — I429 Cardiomyopathy, unspecified: Secondary | ICD-10-CM | POA: Diagnosis not present

## 2015-10-13 DIAGNOSIS — I480 Paroxysmal atrial fibrillation: Secondary | ICD-10-CM

## 2015-10-13 DIAGNOSIS — I428 Other cardiomyopathies: Secondary | ICD-10-CM

## 2015-10-13 NOTE — Progress Notes (Signed)
Patient Care Team: Pincus Sanes, MD as PCP - General (Internal Medicine)   HPI  [name] is a 82 y.o. female seen in followup for acutely identify left ventricular dysfunction with left bundle branch block. She had diarrhea as a complication of a beta blocker.  She is currently being treated with an ACE inhibitor   She has a remote history atrial fibrillation with CHADSVASC scroe of >4 prior TIA, gender and age   .   Echocardiogram 5/14 had demonstrated EF of 35%. Myoview done 7/14 demonstrated an EF of 45%. There is no evidence of ischemia.     Her other complaint is significant shortness of breath. She has a history of pulmonary fibrosis.  She has recently been started on oxygen. She has some problems with peripheral edema. It is not clear to her whether her breathing tracks with her edema or not. We attempted a trial of Aldactone is a diuretic; it showed no interval improvement. She also had days where her blood pressure was very low and she felt terrible. She uses oxygen around the house. She notes that when she uses it with its 40 foot cord she feels better.  She comes in today to review her cardiopulmonary stress test;  It demonstrates cardiac limitations, pulmonary limitations, muscular limitations. In the patient's mind her back pain is a major limitation    Past Medical History  Diagnosis Date  . Obstructive sleep apnea     CPAP Intolerant  . Cystitis 1974  . Thyroid disease     HYPOTHROIDISM  . DJD (degenerative joint disease)     Dr Kellie Simmering  . Idiopathic pulmonary fibrosis     IDIOPATHIC vs CHRONIC ERD  . Hyperlipidemia 2005    NMR LIPOPROFILE: LDL 144 (1570/732), HDL 61, TG 103., LDL GOAL = < 130  . Atrial fibrillation (HCC)     Paroxysmal  . Left bundle branch block     Past Surgical History  Procedure Laterality Date  . Abdominal hysterectomy      Abnormal pap smear; ovaries remaining  . Cholecystectomy  1966  . Appendectomy  1966  .  Cystoscopy      Hematuria   . Cardioversion N/A 11/07/2012    Procedure: CARDIOVERSION;  Surgeon: Cassell Clement, MD;  Location: [facility] ENDOSCOPY;  Service: Cardiovascular;  Laterality: N/A;  . Colonoscopy  2004    negative    Current Outpatient Prescriptions  Medication Sig Dispense Refill  . acetaminophen (TYLENOL) 325 MG tablet Take 325 mg by mouth every 6 (six) hours as needed for pain.    Marland Kitchen albuterol (PROVENTIL HFA;VENTOLIN HFA) 108 (90 BASE) MCG/ACT inhaler Inhale 2 puffs into the lungs every 6 (six) hours as needed for wheezing or shortness of breath. 1 Inhaler 11  . levothyroxine (SYNTHROID, LEVOTHROID) 50 MCG tablet Take 1 tablet (50 mcg total) by mouth daily before breakfast. 90 tablet 1  . losartan (COZAAR) 25 MG tablet Take 1 tablet (25 mg total) by mouth daily.    . Multiple Vitamins-Minerals (PRESERVISION AREDS 2 PO) Take 1 tablet by mouth daily.    Marland Kitchen nystatin (MYCOSTATIN/NYSTOP) 100000 UNIT/GM POWD Apply twice a day as needed 30 g 4  . spironolactone (ALDACTONE) 25 MG tablet Take 1 tablet (25 mg total) by mouth daily. 90 tablet 3  . warfarin (COUMADIN) 5 MG tablet Take 1 tablet (5 mg total) by mouth as directed. Take as directed by anticoagulation clinic 90 tablet 1   No current facility-administered  medications for this visit.    Allergies  Allergen Reactions  . Doxycycline Nausea Only  . Levaquin [Levofloxacin In D5w]     REACTION: leg weakness  . Amoxicillin     REACTION: upset stomach  . Lisinopril     cough  . Metoprolol     diarrhea    Review of Systems negative except from HPI and PMH  Physical Exam BP 110/61 mmHg  Pulse 74  Ht 5\' 7"  (1.702 m)  Wt 131 lb 12.8 oz (59.784 kg)  BMI 20.64 kg/m2  SpO2 92% Well developed and well nourished in no acute distress HENT normal E scleral and icterus clear Neck Supple JVP8-9 carotids brisk and full Diffuse crackles  Regular rate and rhythm, no murmurs gallops or rub Soft with active bowel sounds No clubbing  cyanosis 1+ Edema Alert and oriented, grossly normal motor and sensory function Skin Warm and Dry  ECG demonstrates sinus rhythm at 68\ Intervals 22/17/42 and an axis left  -69 Left bundle branch block Although they are discordant criteria. She has small Q waves in lead 1 and L but she has not seen V3-V6  Assessment and  Plan  Dyspnea on exertion  Pulmonary fibrosis  Nonischemic cardiomyopathy EF 40%  Left bundle branch block  Atrial fibrillation -paroxysmal   Congestive heart failure   The Aldactone made no difference; it did affect her blood pressure in conjunction with the losartan. We will stop it. If low blood pressure remains an issue, we will stop her losartan.  We reviewed the cardiopulmonary stress test findings; I think they're too many factors contributing to her dyspnea to undertake CRT.  She is understanding of this   she continues anticoagulation with warfarin

## 2015-10-13 NOTE — Patient Instructions (Signed)
Medication Instructions:  Your physician has recommended you make the following change in your medication:  1) Stop Spironolactone   Labwork: None ordered   Testing/Procedures: None ordered   Follow-Up: Your physician wants you to follow-up in: 12 months with Dr Logan Bores will receive a reminder letter in the mail two months in advance. If you don't receive a letter, please call our office to schedule the follow-up appointment.   Any Other Special Instructions Will Be Listed Below (If Applicable).     If you need a refill on your cardiac medications before your next appointment, please call your pharmacy.

## 2015-10-21 ENCOUNTER — Ambulatory Visit (INDEPENDENT_AMBULATORY_CARE_PROVIDER_SITE_OTHER): Payer: Medicare Other | Admitting: General Practice

## 2015-10-21 DIAGNOSIS — I4891 Unspecified atrial fibrillation: Secondary | ICD-10-CM | POA: Diagnosis not present

## 2015-10-21 DIAGNOSIS — Z5181 Encounter for therapeutic drug level monitoring: Secondary | ICD-10-CM | POA: Diagnosis not present

## 2015-10-21 LAB — POCT INR: INR: 1.5

## 2015-10-21 NOTE — Progress Notes (Signed)
Pre visit review using our clinic review tool, if applicable. No additional management support is needed unless otherwise documented below in the visit note. 

## 2015-10-21 NOTE — Progress Notes (Signed)
I have reviewed and agree with the plan. 

## 2015-11-08 ENCOUNTER — Ambulatory Visit (INDEPENDENT_AMBULATORY_CARE_PROVIDER_SITE_OTHER)
Admission: RE | Admit: 2015-11-08 | Discharge: 2015-11-08 | Disposition: A | Discharge status: Home / Self Care | Payer: Medicare Other | Source: Ambulatory Visit | Attending: Adult Health | Admitting: Adult Health

## 2015-11-08 ENCOUNTER — Ambulatory Visit: Payer: Medicare Other | Admitting: Pulmonary Disease

## 2015-11-08 ENCOUNTER — Encounter: Payer: Self-pay | Admitting: Adult Health

## 2015-11-08 ENCOUNTER — Ambulatory Visit (INDEPENDENT_AMBULATORY_CARE_PROVIDER_SITE_OTHER): Payer: Medicare Other | Admitting: Adult Health

## 2015-11-08 VITALS — BP 118/60 | HR 69 | Temp 97.4°F | Ht 67.0 in | Wt 133.0 lb

## 2015-11-08 DIAGNOSIS — J9611 Chronic respiratory failure with hypoxia: Secondary | ICD-10-CM

## 2015-11-08 DIAGNOSIS — J8410 Pulmonary fibrosis, unspecified: Secondary | ICD-10-CM | POA: Diagnosis not present

## 2015-11-08 MED ORDER — PREDNISONE 10 MG PO TABS: ORAL_TABLET | ORAL | Status: DC

## 2015-11-08 NOTE — Assessment & Plan Note (Signed)
Progressive symptoms  Check cxr  Steroid trial  Declined OFEV/Esbriet.   Plan  Continue on oxygen 3l/m rest and 4l/m with activity.  Use continuous flow with walking .  Prednisone 40mg  daily for 5 days , 30mg  daily for 5 days then 20mg  daily for 5 days then 10mg  daily for 5 days then 5mg  daily . Hold at this dose.  Chest xray today  Follow up with Dr. Vassie Loll  In 2 months and As needed   Please contact office for sooner follow up if symptoms do not improve or worsen or seek emergency care

## 2015-11-08 NOTE — Patient Instructions (Addendum)
Continue on oxygen 3l/m rest and 4l/m with activity.  Use continuous flow with walking .  Prednisone 40mg  daily for 5 days , 30mg  daily for 5 days then 20mg  daily for 5 days then 10mg  daily for 5 days then 5mg  daily . Hold at this dose.  Chest xray today  Follow up with Dr. Vassie Loll  In 2 months and As needed   Please contact office for sooner follow up if symptoms do not improve or worsen or seek emergency care

## 2015-11-08 NOTE — Progress Notes (Signed)
Subjective:    Patient ID: [name], female    DOB: 1931/06/09, 82 y.o.   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 She was diagnosed by Dr. Patsey Berthold in 2006. RA factor positive -seen by Dr. Charlestine Night and he felt that her symptoms were more related to osteoarthritis   Significant tests/ events   11/2012 She developed proximal atrial fibrillation and underwent DCCV  >>NSR  echo showed EF improved from 35% to 45%.  03/2013 stress test - no ischemia.   PSG in 2006 showed mild obstructive sleep apnea with AHI of 10 events per hour in desaturation to 80% with a few periodic limb movements  12/19/12 RA factor 90, CCP neg, ANA neg, ESR 16   CT chest in December 2006 showed bilateral peripheral interstitial disease with honeycombing consistent with IPF. There was also a 10x6 mm density in the right middle lobe . 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   PFTs 12/2012 - FVC 70%, TLC 61% , DLCO 35%, no BD response   03/12/14 Acute OV >tx w/ Levaquin and steroid taper.   PFTs 10/2014 >> FVC 70%, TLC 62% -stable, DLCO slight drop to 27% She has been noted to desaturate since 01/2014-however she was reluctant to start oxygen. She once again emphasizes that quality of life is important for her She did have a flare in 03/2014 and was treated with Levaquin and steroids with some improvement.  08/19/2014 Acute OV - O2 sats 87% on RA walking, longer to rebound.  Walk test required 4l/ with walking to keep sats >90%.   11/08/2015 Follow up : Pulmonary Fibrosis  Pt returns for 4 month follow up .  Last visit with worsening DOE and dry cough .  Given short prednisone taper. She says this made such a great difference .  She feels much better on prednisone. We discussed steroid side effects.  PVX and Prevnar utd.  Denies chest pain,  orthopnea , hemoptysis , fever. Or discolored mucus.   CPST orderd by Dr. Caryl Comes on 09/2015 showed FVC 65%.   Wt is down 10 lbs over last year. Discussed ensure supplements.    Past Medical History  Diagnosis Date  . Obstructive sleep apnea     CPAP Intolerant  . Cystitis 1974  . Thyroid disease     HYPOTHROIDISM  . DJD (degenerative joint disease)     Dr Charlestine Night  . Idiopathic pulmonary fibrosis     IDIOPATHIC vs CHRONIC ERD  . Hyperlipidemia 2005    NMR LIPOPROFILE: LDL 144 (1570/732), HDL 61, TG 103., LDL GOAL = < 130  . Atrial fibrillation (HCC)     Paroxysmal  . Left bundle branch block    Current Outpatient Prescriptions on File Prior to Visit  Medication Sig Dispense Refill  . acetaminophen (TYLENOL) 325 MG tablet Take 325 mg by mouth every 6 (six) hours as needed for pain.    Marland Kitchen albuterol (PROVENTIL HFA;VENTOLIN HFA) 108 (90 BASE) MCG/ACT inhaler Inhale 2 puffs into the lungs every 6 (six) hours as needed for wheezing or shortness of breath. 1 Inhaler 11  . levothyroxine (SYNTHROID, LEVOTHROID) 50 MCG tablet Take 1 tablet (50 mcg total) by mouth daily before breakfast. 90 tablet 1  . losartan (COZAAR) 25 MG tablet Take 1 tablet (25 mg total) by mouth daily.    . Multiple Vitamins-Minerals (PRESERVISION  AREDS 2 PO) Take 1 tablet by mouth daily.    Marland Kitchen nystatin (MYCOSTATIN/NYSTOP) 100000 UNIT/GM POWD Apply twice a day as needed 30 g 4  . warfarin (COUMADIN) 5 MG tablet Take 1 tablet (5 mg total) by mouth as directed. Take as directed by anticoagulation clinic 90 tablet 1   No current facility-administered medications on file prior to visit.      Review of Systems neg for any significant sore throat, dysphagia, itching, sneezing, nasal congestion or excess/ purulent secretions, fever, chills, sweats, unintended wt loss, pleuritic or exertional cp, hempoptysis, orthopnea pnd or change in chronic leg swelling. Also denies presyncope, palpitations, heartburn, abdominal pain,  nausea, vomiting, diarrhea or change in bowel or urinary habits, dysuria,hematuria, rash, arthralgias, visual complaints, headache, numbness weakness or ataxia.     Objective:   Physical Exam Filed Vitals:   11/08/15 1507  BP: 118/60  Pulse: 69  Temp: 97.4 F (36.3 C)  TempSrc: Oral  Height: _0  (1.702 m)  Weight: 133 lb (60.328 kg)  SpO2: 93%    Gen. Pleasant, elderly in no distress, normal affect, in wc  ENT - no lesions, no post nasal drip Neck: No JVD, no thyromegaly, no carotid bruits Lungs: no use of accessory muscles, no dullness to percussion, BL 1/3 rales , scattered rhonchi  Cardiovascular: Rhythm regular, heart sounds  normal, no murmurs or gallops, tr to 1+  peripheral edema Abdomen: soft and non-tender, no hepatosplenomegaly, BS normal. Musculoskeletal: No deformities, no cyanosis or clubbing Neuro:  alert, non focal  Tammy Parrett NP-C  [facility]  11/08/2015        Assessment & Plan:

## 2015-11-08 NOTE — Assessment & Plan Note (Signed)
Continue on oxygen 3l/m rest and 4l/m with activity.  Use continuous flow with walking .   Chest xray today  Follow up with Dr. Vassie Loll  In 2 months and As needed   Please contact office for sooner follow up if symptoms do not improve or worsen or seek emergency care

## 2015-11-11 NOTE — Progress Notes (Signed)
Reviewed & agree with plan  

## 2015-11-15 ENCOUNTER — Telehealth: Payer: Self-pay | Admitting: Internal Medicine

## 2015-11-15 NOTE — Telephone Encounter (Signed)
Please give her furosemide 20 mg to take as needed Thanks

## 2015-11-15 NOTE — Telephone Encounter (Signed)
New message     The pt is wanting a dieretic.

## 2015-11-15 NOTE — Telephone Encounter (Signed)
I spoke with the patient. She was taken off of spironolactone at her office visit on 10/13/15 with Dr. [name]. She states he told her to call if she felt like she needed a diuretic. She states that she has been having swelling to her bilateral feet/ ankles (ok in the AM, but worse as the day progresses). She is not weighing, but was told by Rubye Oaks, NP that her weight has been trending down from one visit to the next. Her breathing is fairly well controlled at this time. She is currently on a course of prednisone per Tammy Parrett. She has been on this for about 5-6 days and will continue for about 2-3 weeks. I advised her I will review with Dr. [name] and call her back. She is agreeable. She uses [facility] on [address].

## 2015-11-17 MED ORDER — FUROSEMIDE 20 MG PO TABS: ORAL_TABLET | ORAL | Status: DC

## 2015-11-17 NOTE — Telephone Encounter (Signed)
RX sent in to [facility] on [address] for furosemide 20 mg one tablet by mouth once daily as needed for swelling. The patient is aware and agreeable.

## 2015-11-18 ENCOUNTER — Ambulatory Visit: Payer: Medicare Other

## 2015-11-18 ENCOUNTER — Ambulatory Visit (INDEPENDENT_AMBULATORY_CARE_PROVIDER_SITE_OTHER): Payer: Medicare Other | Admitting: General Practice

## 2015-11-18 DIAGNOSIS — Z5181 Encounter for therapeutic drug level monitoring: Secondary | ICD-10-CM | POA: Diagnosis not present

## 2015-11-18 DIAGNOSIS — I4891 Unspecified atrial fibrillation: Secondary | ICD-10-CM

## 2015-11-18 LAB — POCT INR: INR: 3.6

## 2015-11-18 NOTE — Progress Notes (Signed)
Pre visit review using our clinic review tool, if applicable. No additional management support is needed unless otherwise documented below in the visit note. 

## 2015-11-20 NOTE — Progress Notes (Signed)
I have reviewed and agree with the plan. 

## 2015-12-16 ENCOUNTER — Ambulatory Visit (INDEPENDENT_AMBULATORY_CARE_PROVIDER_SITE_OTHER): Payer: Medicare Other | Admitting: General Practice

## 2015-12-16 DIAGNOSIS — Z5181 Encounter for therapeutic drug level monitoring: Secondary | ICD-10-CM

## 2015-12-16 DIAGNOSIS — I4891 Unspecified atrial fibrillation: Secondary | ICD-10-CM | POA: Diagnosis not present

## 2015-12-16 LAB — POCT INR: INR: 1.9

## 2015-12-16 NOTE — Progress Notes (Signed)
Pre visit review using our clinic review tool, if applicable. No additional management support is needed unless otherwise documented below in the visit note. 

## 2015-12-16 NOTE — Progress Notes (Signed)
I have reviewed and agree with the plan. 

## 2015-12-19 ENCOUNTER — Other Ambulatory Visit: Payer: Self-pay | Admitting: General Practice

## 2015-12-19 ENCOUNTER — Telehealth: Payer: Self-pay

## 2015-12-19 ENCOUNTER — Other Ambulatory Visit: Payer: Self-pay | Admitting: Internal Medicine

## 2015-12-19 MED ORDER — WARFARIN SODIUM 5 MG PO TABS: 5.0000 mg | ORAL_TABLET | ORAL | Status: DC

## 2015-12-19 MED ORDER — LEVOTHYROXINE SODIUM 50 MCG PO TABS: 50.0000 ug | ORAL_TABLET | Freq: Every day | ORAL | Status: DC

## 2015-12-19 NOTE — Telephone Encounter (Signed)
Routing to cindy boyd/rn for coumadin refill request----synthroid refill request has already been sent to [facility]

## 2015-12-20 NOTE — Telephone Encounter (Signed)
error 

## 2016-01-04 ENCOUNTER — Encounter: Payer: Self-pay | Admitting: Pulmonary Disease

## 2016-01-04 ENCOUNTER — Ambulatory Visit (INDEPENDENT_AMBULATORY_CARE_PROVIDER_SITE_OTHER): Payer: Medicare Other | Admitting: Pulmonary Disease

## 2016-01-04 DIAGNOSIS — J8410 Pulmonary fibrosis, unspecified: Secondary | ICD-10-CM | POA: Diagnosis not present

## 2016-01-04 DIAGNOSIS — I428 Other cardiomyopathies: Secondary | ICD-10-CM

## 2016-01-04 DIAGNOSIS — I429 Cardiomyopathy, unspecified: Secondary | ICD-10-CM | POA: Diagnosis not present

## 2016-01-04 MED ORDER — PREDNISONE 10 MG PO TABS: ORAL_TABLET | ORAL | 0 refills | Status: DC

## 2016-01-04 MED ORDER — FUROSEMIDE 20 MG PO TABS: ORAL_TABLET | ORAL | 0 refills | Status: DC

## 2016-01-04 NOTE — Assessment & Plan Note (Signed)
Continue up to 4 L oxygen on exertion Consider pulmonary rehabilitation if her dyspnea improves

## 2016-01-04 NOTE — Patient Instructions (Signed)
Prednisone 10 mg tabs  Take 2 tabs daily with food x 7ds, then 1 tab daily with food   Lasix 20 mg tabs -  Take 2 tabs daily  x 7ds, then 1 tab daily

## 2016-01-04 NOTE — Assessment & Plan Note (Signed)
Lasix 20 mg tabs -  Take 2 tabs daily  x 7ds, then 1 tab daily  -For what seems to be an acute decompensation with increased dyspnea and pedal edema

## 2016-01-04 NOTE — Progress Notes (Signed)
Subjective:    Patient ID: [name], female    DOB: 07/24/1931, 82 y.o.   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 She was diagnosed by Dr. Patsey Berthold in 2006. RA factor positive -seen by Dr. Charlestine Night and he felt that her symptoms were more related to osteoarthritis   01/04/2016  Chief Complaint  Patient presents with  . Follow-up    breathing has been hard this month.  Exercise makes her breath more heavy.  uses Oxygen at night, but only uses oxygen when needed during the day.  O2 86% upon arrival on room air.    She is compliant with her oxygen and uses up to 4 L on exertion Her breathing has been worse over the last 1 month She has increasing bipedal edema She received prednisone earlier this year and felt better-this has been her pattern in the past She is felt to have nonischemic cardiomyopathy, underwent stress testing to see if a pacemaker would help and Dr. Caryl Comes told her that this would not be helpful   Past Medical History:  Diagnosis Date  . Atrial fibrillation (HCC)    Paroxysmal  . Cystitis 1974  . DJD (degenerative joint disease)    Dr Charlestine Night  . Hyperlipidemia 2005   NMR LIPOPROFILE: LDL 144 (1570/732), HDL 61, TG 103., LDL GOAL = < 130  . Idiopathic pulmonary fibrosis    IDIOPATHIC vs CHRONIC ERD  . Left bundle branch block   . Obstructive sleep apnea    CPAP Intolerant  . Thyroid disease    HYPOTHROIDISM    Significant tests/ events   11/2012 She developed proximal atrial fibrillation and underwent DCCV  >>NSR  echo showed EF improved from 35% to 45%.  03/2013 stress test - no ischemia.   PSG in 2006 showed mild obstructive sleep apnea with AHI of 10 events per hour in desaturation to 80% with a few periodic limb movements  12/2012 RA factor 90, CCP neg, ANA neg, ESR 16   CT chest in December 2006 showed bilateral peripheral interstitial disease with honeycombing consistent with IPF. There was  also a 10x6 mm density in the right middle lobe . 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   PFTs 12/2012 - FVC 70%, TLC 61% , DLCO 35%, no BD response   03/12/14 Acute OV >tx w/ Levaquin and steroid taper.   PFTs 10/2014 >> FVC 70%, TLC 62% -stable, DLCO slight drop to 27%    08/19/2014 Acute OV - O2 sats 87% on RA walking, longer to rebound.  Walk test required 4l/ with walking to keep sats >90%.   CPST - Dr. Caryl Comes o 09/2015 showed FVC 65%. , VO2 62%, cardiac limitation, surprisingly no ventilatory limitation to exercise    Review of Systems neg for any significant sore throat, dysphagia, itching, sneezing, nasal congestion or excess/ purulent secretions, fever, chills, sweats, unintended wt loss, pleuritic or exertional cp, hempoptysis, orthopnea pnd or change in chronic leg swelling. Also denies presyncope, palpitations, heartburn, abdominal pain, nausea, vomiting, diarrhea or change in bowel or urinary habits, dysuria,hematuria, rash, arthralgias, visual complaints, headache, numbness weakness or ataxia.     Objective:   Physical Exam  Gen. Pleasant, well-nourished, in no distress ENT - no lesions, no post nasal drip Neck: No JVD, no thyromegaly, no carotid bruits Lungs: no use of accessory muscles, no dullness  to percussion, bibasal rales, no rhonchi  Cardiovascular: Rhythm regular, heart sounds  normal, no murmurs or gallops, 2+ peripheral edema Musculoskeletal: No deformities, no cyanosis or clubbing        Assessment & Plan:

## 2016-01-04 NOTE — Assessment & Plan Note (Addendum)
Based on her prior history of steroid responsiveness, we'll try low-dose steroids for an extended period this time She has refused anti-fibrotic therapy in the past  Prednisone 10 mg tabs  Take 2 tabs daily with food x 7ds, then 1 tab daily with food

## 2016-01-13 ENCOUNTER — Ambulatory Visit (INDEPENDENT_AMBULATORY_CARE_PROVIDER_SITE_OTHER): Payer: Self-pay | Admitting: General Practice

## 2016-01-13 DIAGNOSIS — Z5181 Encounter for therapeutic drug level monitoring: Secondary | ICD-10-CM

## 2016-01-13 LAB — POCT INR: INR: 2.7

## 2016-01-13 NOTE — Progress Notes (Signed)
I have reviewed and agree with the plan. 

## 2016-01-23 ENCOUNTER — Other Ambulatory Visit: Payer: Self-pay | Admitting: Internal Medicine

## 2016-01-27 ENCOUNTER — Other Ambulatory Visit (INDEPENDENT_AMBULATORY_CARE_PROVIDER_SITE_OTHER): Payer: Medicare Other

## 2016-01-27 ENCOUNTER — Encounter: Payer: Self-pay | Admitting: Pulmonary Disease

## 2016-01-27 ENCOUNTER — Ambulatory Visit (INDEPENDENT_AMBULATORY_CARE_PROVIDER_SITE_OTHER): Payer: Medicare Other | Admitting: Pulmonary Disease

## 2016-01-27 DIAGNOSIS — J9611 Chronic respiratory failure with hypoxia: Secondary | ICD-10-CM

## 2016-01-27 DIAGNOSIS — J8410 Pulmonary fibrosis, unspecified: Secondary | ICD-10-CM | POA: Diagnosis not present

## 2016-01-27 LAB — BASIC METABOLIC PANEL
BUN: 27 mg/dL — ABNORMAL HIGH (ref 6–23)
CO2: 28 meq/L (ref 19–32)
Calcium: 8.4 mg/dL (ref 8.4–10.5)
Chloride: 107 mEq/L (ref 96–112)
Creatinine, Ser: 1.34 mg/dL — ABNORMAL HIGH (ref 0.40–1.20)
GFR: 40.24 mL/min — AB (ref >60.00)
GLUCOSE: 91 mg/dL (ref 70–99)
POTASSIUM: 4.2 meq/L (ref 3.5–5.1)
SODIUM: 142 meq/L (ref 135–145)

## 2016-01-27 MED ORDER — PREDNISONE 10 MG PO TABS: ORAL_TABLET | ORAL | 0 refills | Status: DC

## 2016-01-27 NOTE — Assessment & Plan Note (Addendum)
Use oxygen whenever you walk She clearly indicates to me that she would not want life support and would be amenable to hospice and if all medical interventions fail

## 2016-01-27 NOTE — Progress Notes (Signed)
   Subjective:    Patient ID: [name], female    DOB: [birth year], 82 y.o.   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 She was diagnosed by Dr. Jayme Cloud in 2006. RA factor positive -seen by Dr. Kellie Simmering and he felt that her symptoms were more related to osteoarthritis   01/27/2016  Chief Complaint  Patient presents with  . Follow-up    Pt c/o increasing ShOB since stopping oral prednisone and Lasix. Pt reports that her O2 sats drop significantly with exertion. Pt reports that she uses 4L/O2 with exertion and qhs. Pt states that she is needing O2 more frequently.      She is compliant with her oxygen and uses up to 4 L on exertion Her breathing was worse with   increasing bipedal edema In the past she had responded to prednisone, she was placed on prednisone last visit-maintained for is for her to be in an extended course but she stopped it after 2 weeks-misread instructions, she will remarkably improved but now is back to feeling worse again Pedal edema improved with Lasix but this made her hurt to the bone She is felt to have nonischemic cardiomyopathy, underwent stress testing to see if a pacemaker would help and Dr. [name] told her that this would not be helpful   Past Medical History:  Diagnosis Date  . Atrial fibrillation (HCC)    Paroxysmal  . Cystitis 1974  . DJD (degenerative joint disease)    Dr Kellie Simmering  . Hyperlipidemia 2005   NMR LIPOPROFILE: LDL 144 (1570/732), HDL 61, TG 103., LDL GOAL = < 130  . Idiopathic pulmonary fibrosis    IDIOPATHIC vs CHRONIC ERD  . Left bundle branch block   . Obstructive sleep apnea    CPAP Intolerant  . Thyroid disease    HYPOTHROIDISM     Review of Systems neg for any significant sore throat, dysphagia, itching, sneezing, nasal congestion or excess/ purulent secretions, fever, chills, sweats, unintended wt loss, pleuritic or exertional cp, hempoptysis, orthopnea pnd or change in  chronic leg swelling. Also denies presyncope, palpitations, heartburn, abdominal pain, nausea, vomiting, diarrhea or change in bowel or urinary habits, dysuria,hematuria, rash, arthralgias, visual complaints, headache, numbness weakness or ataxia.     Objective:   Physical Exam  Gen. Pleasant, well-nourished, in no distress ENT - no lesions, no post nasal drip Neck: No JVD, no thyromegaly, no carotid bruits Lungs: no use of accessory muscles, no dullness to percussion, bibasal rales, no rhonchi  Cardiovascular: Rhythm regular, heart sounds  normal, no murmurs or gallops, no peripheral edema Musculoskeletal: No deformities, no cyanosis or clubbing        Assessment & Plan:

## 2016-01-27 NOTE — Patient Instructions (Signed)
Prednisone 10 mg tabs  Take 4 tabs daily with food x 3ds, then 3 tabs daily x 3 days, then 2 tabs daily x 5 days , then 1 tab daily  until next visit   BMET today -based on this we will advise you about the dose of Lasix to take Use oxygen whenever you walk

## 2016-01-27 NOTE — Assessment & Plan Note (Signed)
Prednisone 10 mg tabs  Take 4 tabs daily with food x 3ds, then 3 tabs daily x 3 days, then 2 tabs daily x 5 days , then 1 tab daily  until next visit   BMET today -based on this we will advise you about the dose of Lasix to take

## 2016-01-31 ENCOUNTER — Other Ambulatory Visit: Payer: Self-pay | Admitting: Pulmonary Disease

## 2016-02-03 ENCOUNTER — Other Ambulatory Visit: Payer: Self-pay | Admitting: *Deleted

## 2016-02-03 DIAGNOSIS — J8410 Pulmonary fibrosis, unspecified: Secondary | ICD-10-CM

## 2016-02-03 MED ORDER — FUROSEMIDE 20 MG PO TABS: ORAL_TABLET | ORAL | 0 refills | Status: DC

## 2016-02-07 ENCOUNTER — Other Ambulatory Visit: Payer: Self-pay | Admitting: Pulmonary Disease

## 2016-02-10 ENCOUNTER — Ambulatory Visit (INDEPENDENT_AMBULATORY_CARE_PROVIDER_SITE_OTHER): Payer: Medicare Other | Admitting: General Practice

## 2016-02-10 DIAGNOSIS — Z5181 Encounter for therapeutic drug level monitoring: Secondary | ICD-10-CM | POA: Diagnosis not present

## 2016-02-10 LAB — POCT INR: INR: 3

## 2016-02-12 NOTE — Progress Notes (Signed)
I have reviewed and agree with the plan. 

## 2016-02-28 ENCOUNTER — Ambulatory Visit (INDEPENDENT_AMBULATORY_CARE_PROVIDER_SITE_OTHER): Payer: Medicare Other | Admitting: Adult Health

## 2016-02-28 ENCOUNTER — Encounter: Payer: Self-pay | Admitting: Adult Health

## 2016-02-28 DIAGNOSIS — J8410 Pulmonary fibrosis, unspecified: Secondary | ICD-10-CM

## 2016-02-28 DIAGNOSIS — J9611 Chronic respiratory failure with hypoxia: Secondary | ICD-10-CM

## 2016-02-28 MED ORDER — PREDNISONE 10 MG PO TABS: 10.0000 mg | ORAL_TABLET | Freq: Every day | ORAL | 3 refills | Status: DC

## 2016-02-28 NOTE — Progress Notes (Signed)
Subjective:    Patient ID: [name], female    DOB: 08-28-1931, 82 y.o.   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 She was diagnosed by Dr. Patsey Berthold in 2006. RA factor positive -seen by Dr. Charlestine Night and he felt that her symptoms were more related to osteoarthritis   Significant tests/ events  11/2012 She developed proximal atrial fibrillation and underwent DCCV  >>NSR  echo showed EF improved from 35% to 45%.  03/2013 stress test - no ischemia.   PSG in 2006 showed mild obstructive sleep apnea with AHI of 10 events per hour in desaturation to 80% with a few periodic limb movements  12/19/12 RA factor 90, CCP neg, ANA neg, ESR 16   CT chest in December 2006 showed bilateral peripheral interstitial disease with honeycombing consistent with IPF. There was also a 10x6 mm density in the right middle lobe . 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   PFTs 12/2012 - FVC 70%, TLC 61% , DLCO 35%, no BD response   03/12/14 Acute OV >tx w/ Levaquin and steroid taper.   PFTs 10/2014 >> FVC 70%, TLC 62% -stable, DLCO slight drop to 27% She has been noted to desaturate since 01/2014-however she was reluctant to start oxygen. She once again emphasizes that quality of life is important for her She did have a flare in 03/2014 and was treated with Levaquin and steroids with some improvement.  08/19/2014 Acute OV - O2 sats 87% on RA walking, longer to rebound.  Walk test required 4l/ with walking to keep sats >90%.  Has refused Anti-fibrotics in past .   02/28/2016 Follow up : Pulmonary Fibrosis  Pt returns for  1 month follow up .  Last visit with worsening DOE and dry cough .  Started on prednisone 80m daily . She says this made such a great difference .  She feels better on prednisone. We discussed steroid side effects.  PReading2012   and Prevnar 2015 utd. Flu shot utd.  Denies chest pain, orthopnea , hemoptysis , fever. Or discolored mucus.  Wt remains down 10 lbs over last year. Encouraged Boost/Ensure supplements.    Past Medical History:  Diagnosis Date  . Atrial fibrillation (HCC)    Paroxysmal  . Cystitis 1974  . DJD (degenerative joint disease)    Dr TCharlestine Night . Hyperlipidemia 2005   NMR LIPOPROFILE: LDL 144 (1570/732), HDL 61, TG 103., LDL GOAL = < 130  . Idiopathic pulmonary fibrosis    IDIOPATHIC vs CHRONIC ERD  . Left bundle branch block   . Obstructive sleep apnea    CPAP Intolerant  . Thyroid disease    HYPOTHROIDISM   Current Outpatient Prescriptions on File Prior to Visit  Medication Sig Dispense Refill  . acetaminophen (TYLENOL) 325 MG tablet Take 325 mg by mouth every 6 (six) hours as needed for pain.    .Marland Kitchenalbuterol (PROVENTIL HFA;VENTOLIN HFA) 108 (90 BASE) MCG/ACT inhaler Inhale 2 puffs into the lungs every 6 (six) hours as needed for wheezing or shortness of breath. 1 Inhaler 11  . furosemide (LASIX) 20 MG tablet Take 1 tablet daily 30 tablet 0  . levothyroxine (SYNTHROID, LEVOTHROID) 50 MCG tablet Take 1 tablet (50 mcg total) by mouth daily before breakfast. 90 tablet 0  . Multiple Vitamins-Minerals (PRESERVISION AREDS 2 PO) Take 1 tablet by mouth daily.    .Marland Kitchen  nystatin (MYCOSTATIN/NYSTOP) 100000 UNIT/GM POWD Apply twice a day as needed 30 g 4  . predniSONE (DELTASONE) 10 MG tablet Take 1 tablet (10 mg total) by mouth daily with breakfast. 30 tablet 0  . warfarin (COUMADIN) 5 MG tablet Take 1 tablet (5 mg total) by mouth as directed. Take as directed by anticoagulation clinic 90 tablet 1  . losartan (COZAAR) 25 MG tablet Take 1 tablet (25 mg total) by mouth daily.     No current facility-administered medications on file prior to visit.       Review of Systems neg for any significant sore throat, dysphagia, itching, sneezing, nasal congestion or excess/ purulent secretions, fever, chills,  sweats, unintended wt loss, pleuritic or exertional cp, hempoptysis, orthopnea pnd or change in chronic leg swelling. Also denies presyncope, palpitations, heartburn, abdominal pain, nausea, vomiting, diarrhea or change in bowel or urinary habits, dysuria,hematuria, rash, arthralgias, visual complaints, headache, numbness weakness or ataxia.     Objective:   Physical Exam Vitals:   02/28/16 1449  BP: 122/66  Pulse: 68  Temp: 97.6 F (36.4 C)  TempSrc: Oral  SpO2: 92%  Weight: 130 lb (59 kg)  Height: _0  (1.702 m)    Gen. Pleasant, elderly in no distress, normal affect, in wc  ENT - no lesions, no post nasal drip Neck: No JVD, no thyromegaly, no carotid bruits Lungs: no use of accessory muscles, no dullness to percussion, BL 1/3 rales , scattered rhonchi  Cardiovascular: Rhythm regular, heart sounds  normal, no murmurs or gallops, tr to 1+  peripheral edema Abdomen: soft and non-tender, no hepatosplenomegaly, BS normal. Musculoskeletal: No deformities, no cyanosis or clubbing Neuro:  alert, non focal  Kitt Minardi NP-C  [facility]  02/28/2016

## 2016-02-28 NOTE — Addendum Note (Signed)
Addended by: Karalee Height on: 02/28/2016 03:32 PM   Modules accepted: Orders

## 2016-02-28 NOTE — Assessment & Plan Note (Signed)
Patient Instructions  Continue on oxygen 3l/m rest and 4l/m with activity.  Use continuous flow with walking .  Stay on Prednisone 10mg  daily .  Follow up with Dr. Vassie Loll  In 2 months and As needed   Please contact office for sooner follow up if symptoms do not improve or worsen or seek emergency care

## 2016-02-28 NOTE — Assessment & Plan Note (Signed)
Improved symptom control on low dose steroids  Flu /PVX/Prevnar utd.  Steroid education given  Stay on low dose prednisone 10mg  daily   Plan  Patient Instructions  Continue on oxygen 3l/m rest and 4l/m with activity.  Use continuous flow with walking .  Stay on Prednisone 10mg  daily .  Follow up with Dr. Vassie Loll  In 2 months and As needed   Please contact office for sooner follow up if symptoms do not improve or worsen or seek emergency care

## 2016-02-28 NOTE — Patient Instructions (Signed)
Continue on oxygen 3l/m rest and 4l/m with activity.  Use continuous flow with walking .  Stay on Prednisone 10mg  daily .  Follow up with Dr. Vassie Loll  In 2 months and As needed   Please contact office for sooner follow up if symptoms do not improve or worsen or seek emergency care

## 2016-03-05 NOTE — Progress Notes (Signed)
Reviewed & agree with plan  

## 2016-03-16 ENCOUNTER — Ambulatory Visit (INDEPENDENT_AMBULATORY_CARE_PROVIDER_SITE_OTHER): Payer: Medicare Other | Admitting: General Practice

## 2016-03-16 DIAGNOSIS — Z5181 Encounter for therapeutic drug level monitoring: Secondary | ICD-10-CM

## 2016-03-16 DIAGNOSIS — I4891 Unspecified atrial fibrillation: Secondary | ICD-10-CM

## 2016-03-16 LAB — POCT INR: INR: 3

## 2016-03-16 NOTE — Progress Notes (Signed)
I have reviewed and agree with the plan. 

## 2016-03-28 ENCOUNTER — Telehealth: Payer: Self-pay | Admitting: Pulmonary Disease

## 2016-03-28 DIAGNOSIS — J8410 Pulmonary fibrosis, unspecified: Secondary | ICD-10-CM

## 2016-03-28 MED ORDER — FUROSEMIDE 20 MG PO TABS
ORAL_TABLET | ORAL | 3 refills | Status: DC
Start: 2016-03-28 — End: 2016-12-19

## 2016-03-28 MED ORDER — PREDNISONE 10 MG PO TABS: 10.0000 mg | ORAL_TABLET | Freq: Every day | ORAL | 3 refills | Status: DC

## 2016-03-28 NOTE — Telephone Encounter (Signed)
Pt requesting refills on furosemide and prednisone.  These have been sent.  Nothing further needed.

## 2016-04-13 ENCOUNTER — Ambulatory Visit (INDEPENDENT_AMBULATORY_CARE_PROVIDER_SITE_OTHER): Payer: Medicare Other | Admitting: General Practice

## 2016-04-13 DIAGNOSIS — Z5181 Encounter for therapeutic drug level monitoring: Secondary | ICD-10-CM

## 2016-04-13 LAB — POCT INR: INR: 2.6

## 2016-04-13 NOTE — Progress Notes (Signed)
I have reviewed and agree with the plan. 

## 2016-04-13 NOTE — Patient Instructions (Signed)
Pre visit review using our clinic review tool, if applicable. No additional management support is needed unless otherwise documented below in the visit note. 

## 2016-04-18 ENCOUNTER — Encounter: Payer: Self-pay | Admitting: Pulmonary Disease

## 2016-04-18 ENCOUNTER — Ambulatory Visit (INDEPENDENT_AMBULATORY_CARE_PROVIDER_SITE_OTHER): Payer: Medicare Other | Admitting: Pulmonary Disease

## 2016-04-18 DIAGNOSIS — J9611 Chronic respiratory failure with hypoxia: Secondary | ICD-10-CM

## 2016-04-18 MED ORDER — PREDNISONE 10 MG PO TABS: 10.0000 mg | ORAL_TABLET | Freq: Every day | ORAL | 1 refills | Status: DC

## 2016-04-18 NOTE — Patient Instructions (Signed)
Increase prednisone to 20 mg daily Call me in 2 weeks to report if this is helping, if not-we will decrease back down to 10 mg daily

## 2016-04-18 NOTE — Progress Notes (Signed)
   Subjective:    Patient ID: [name], female    DOB: 1931-11-03, 82 y.o.   MRN: [id number]  HPI  82y.o. Retired Haematologist presents for FU of pulmonary fibrosis . She was diagnosed by Dr. Patsey Berthold in 2006. RA factor positive -seen by Dr. Charlestine Night and he felt that her symptoms were more related to osteoarthritis  She has nonischemic cardiomyopathy -Kein  04/18/2016  Chief Complaint  Patient presents with  . Follow-up    19morov. pt states breathing is baseline. pt c/o sob with exertion, occ non prod cough & occ wheezing    She improved with prednisone, has now been on 10 mg since 01/2016 She improved to the point where she was able to make a trip to [place] to watch her grandson perform  She is compliant with her oxygen and uses up to 4 L on exertion Edema has decreased , compliant with Lasix       Significant tests/ events  11/2012 She developed proximal atrial fibrillation and underwent DCCV >>NSR  echo showed EF improved from 35% to 45%.  03/2013 stress test - no ischemia.   PSG in 2006 showed mild obstructive sleep apnea with AHI of 10 events per hour in desaturation to 80% with a few periodic limb movements  12/2012 RA factor 90, CCP neg, ANA neg, ESR 16   CT chest in December 2006 showed bilateral peripheral interstitial disease with honeycombing consistent with IPF. There was also a 10x6 mm density in the right middle lobe . 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   PFTs 12/2012 - FVC 70%, TLC 61% , DLCO 35%, no BD response   03/12/14 Acute OV >tx w/ Levaquin and steroid taper.   PFTs 10/2014 >> FVC 70%, TLC 62% -stable, DLCO slight drop to 27%    08/19/2014 Acute OV - O2 sats 87% on RA walking, longer to rebound.  Walk test required 4l/ with walking to keep sats >90%.   CPST - 09/2015 showed FVC 65%. , VO2 62%, cardiac  limitation, surprisingly no ventilatory limitation to exercise  Review of Systems neg for any significant sore throat, dysphagia, itching, sneezing, nasal congestion or excess/ purulent secretions, fever, chills, sweats, unintended wt loss, pleuritic or exertional cp, hempoptysis, orthopnea pnd or change in chronic leg swelling. Also denies presyncope, palpitations, heartburn, abdominal pain, nausea, vomiting, diarrhea or change in bowel or urinary habits, dysuria,hematuria, rash, arthralgias, visual complaints, headache, numbness weakness or ataxia.     Objective:   Physical Exam   Gen. Pleasant, well-nourished, in no distress ENT - no lesions, no post nasal drip Neck: No JVD, no thyromegaly, no carotid bruits Lungs: no use of accessory muscles, no dullness to percussion, BL 1/2 ralesnor rhonchi  Cardiovascular: Rhythm regular, heart sounds  normal, no murmurs or gallops, no peripheral edema Musculoskeletal: No deformities, no cyanosis or clubbing         Assessment & Plan:

## 2016-04-18 NOTE — Assessment & Plan Note (Signed)
She does have response to prednisone which would be atypical for IPF We will push prednisone some more to see if she has more benefit, if not we will cut it down to lowest dose required 5-10 mg  Increase prednisone to 20 mg daily Call me in 2 weeks to report if this is helping, if not-we will decrease back down to 10 mg daily

## 2016-04-18 NOTE — Assessment & Plan Note (Signed)
She is off oxygen today at rest about 90% She will continue to use oxygen during exertion and sleep

## 2016-05-18 ENCOUNTER — Ambulatory Visit (INDEPENDENT_AMBULATORY_CARE_PROVIDER_SITE_OTHER): Payer: Medicare Other | Admitting: General Practice

## 2016-05-18 DIAGNOSIS — Z5181 Encounter for therapeutic drug level monitoring: Secondary | ICD-10-CM

## 2016-05-18 DIAGNOSIS — I4891 Unspecified atrial fibrillation: Secondary | ICD-10-CM

## 2016-05-18 LAB — POCT INR: INR: 3.9

## 2016-05-18 NOTE — Patient Instructions (Signed)
Pre visit review using our clinic review tool, if applicable. No additional management support is needed unless otherwise documented below in the visit note. 

## 2016-05-20 NOTE — Progress Notes (Signed)
I have reviewed and agree with the plan. 

## 2016-05-25 ENCOUNTER — Telehealth: Payer: Self-pay | Admitting: Pulmonary Disease

## 2016-05-25 MED ORDER — PREDNISONE 10 MG PO TABS: ORAL_TABLET | ORAL | 3 refills | Status: DC

## 2016-05-25 NOTE — Telephone Encounter (Signed)
According to Dr. Vassie Loll  Last note she is on chronic prednisone is that not what she is doing . He increased to 20mg  daily last ov. ???

## 2016-05-25 NOTE — Telephone Encounter (Signed)
She has been on prednisone since August, should not stop prednisone abruptly as can cause probs.  Would restart prednisone 20mg  daily for 3 days then go to prednisone 10mg  daily and hold at this dose  Prednisone 10mg  #33 , 3 refills .  Will need ov for follow up in few weeks with Dr. Vassie Loll  Or myself.  Please contact office for sooner follow up if symptoms do not improve or worsen or seek emergency care

## 2016-05-25 NOTE — Telephone Encounter (Signed)
Pt states she took 20mg  X 2 weeks. Pt states she ankles started to swell, so she stopped the prednisone and did not make RA aware of this.  TP please advise. Thanks.

## 2016-05-25 NOTE — Telephone Encounter (Signed)
Called and spoke with pt and she stated that she started to feel better and stopped taking the prednisone.  She stated that she is now coughing with white sputum, she does have some increased SOB and her feet are swelling.  She is requesting that this be sent over to TP to be addressed since she has seen TP in the past. The pt is requesting to see if she needs to go back on the prednisone, and if she needs an abx and what to do about the swelling.  Thanks TP  Allergies  Allergen Reactions  . Doxycycline Nausea Only  . Levaquin [Levofloxacin In D5w]     REACTION: leg weakness  . Amoxicillin     REACTION: upset stomach  . Lisinopril     cough  . Metoprolol     diarrhea

## 2016-05-25 NOTE — Telephone Encounter (Signed)
Rx sent to preferred pharmacy. Pt aware & voiced her understanding.  Nothing further needed.  

## 2016-06-15 ENCOUNTER — Ambulatory Visit: Payer: Medicare Other

## 2016-06-19 ENCOUNTER — Ambulatory Visit (INDEPENDENT_AMBULATORY_CARE_PROVIDER_SITE_OTHER): Payer: Medicare Other | Admitting: Adult Health

## 2016-06-19 ENCOUNTER — Encounter: Payer: Self-pay | Admitting: Adult Health

## 2016-06-19 ENCOUNTER — Ambulatory Visit (INDEPENDENT_AMBULATORY_CARE_PROVIDER_SITE_OTHER): Payer: Medicare Other | Admitting: General Practice

## 2016-06-19 DIAGNOSIS — J9611 Chronic respiratory failure with hypoxia: Secondary | ICD-10-CM | POA: Diagnosis not present

## 2016-06-19 DIAGNOSIS — I4891 Unspecified atrial fibrillation: Secondary | ICD-10-CM

## 2016-06-19 DIAGNOSIS — J8410 Pulmonary fibrosis, unspecified: Secondary | ICD-10-CM

## 2016-06-19 DIAGNOSIS — Z5181 Encounter for therapeutic drug level monitoring: Secondary | ICD-10-CM

## 2016-06-19 LAB — POCT INR: INR: 2.5

## 2016-06-19 MED ORDER — CLOTRIMAZOLE-BETAMETHASONE 1-0.05 % EX CREA: 1.0000 "application " | TOPICAL_CREAM | Freq: Two times a day (BID) | CUTANEOUS | 0 refills | Status: DC

## 2016-06-19 MED ORDER — ALBUTEROL SULFATE HFA 108 (90 BASE) MCG/ACT IN AERS: 2.0000 | INHALATION_SPRAY | Freq: Four times a day (QID) | RESPIRATORY_TRACT | 3 refills | Status: DC | PRN

## 2016-06-19 NOTE — Patient Instructions (Signed)
Pre visit review using our clinic review tool, if applicable. No additional management support is needed unless otherwise documented below in the visit note. 

## 2016-06-19 NOTE — Assessment & Plan Note (Signed)
Cont on O2 .  

## 2016-06-19 NOTE — Patient Instructions (Addendum)
Continue on oxygen 4l/m .  Use continuous flow with walking .  Stay on Prednisone 10mg  daily .  Follow up with Dr. Vassie Loll  In 3 months and As needed   Please contact office for sooner follow up if symptoms do not improve or worsen or seek emergency care  Lotrisone cream apply to rash daily for 2 weeks, if not better see Primary MD.

## 2016-06-19 NOTE — Progress Notes (Signed)
I have reviewed and agree with the plan. 

## 2016-06-19 NOTE — Assessment & Plan Note (Signed)
Stable , unable to tolerate higher dose of prednisone , cont on 10mg  daily   Plan  Patient Instructions  Continue on oxygen 4l/m .  Use continuous flow with walking .  Stay on Prednisone 10mg  daily .  Follow up with Dr. Vassie Loll  In 3 months and As needed   Please contact office for sooner follow up if symptoms do not improve or worsen or seek emergency care  Lotrisone cream apply to rash daily for 2 weeks, if not better see Primary MD.

## 2016-06-19 NOTE — Progress Notes (Signed)
_0  ID: Alto Denver, female    DOB: 1931-12-12, 83 y.o.   MRN: [id number]  Chief Complaint  Patient presents with  . 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 She was diagnosed by Dr. Patsey Berthold in 2006. RA factor positive -seen by Dr. Charlestine Night and he felt that her symptoms were more related to osteoarthritis   Significant tests/ events  11/2012 She developed proximal atrial fibrillation and underwent DCCV  >>NSR  echo showed EF improved from 35% to 45%.  03/2013 stress test - no ischemia.   PSG in 2006 showed mild obstructive sleep apnea with AHI of 10 events per hour in desaturation to 80% with a few periodic limb movements  12/19/12 RA factor 90, CCP neg, ANA neg, ESR 16   CT chest in December 2006 showed bilateral peripheral interstitial disease with honeycombing consistent with IPF. There was also a 10x6 mm density in the right middle lobe . 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   PFTs 12/2012 - FVC 70%, TLC 61% , DLCO 35%, no BD response   03/12/14 Acute OV >tx w/ Levaquin and steroid taper.   PFTs 10/2014 >> FVC 70%, TLC 62% -stable, DLCO slight drop to 27% She has been noted to desaturate since 01/2014-however she was reluctant to start oxygen. She once again emphasizes that quality of life is important for her She did have a flare in 03/2014 and was treated with Levaquin and steroids with some improvement.  08/19/2014  - O2 sats 87% on RA walking, longer to rebound.  Walk test required 4l/ with walking to keep sats >90%.  Has refused Anti-fibrotics in past .    06/19/2016 Follow up : Pulmonary Fibrosis /O2 RF  Pt returns for 2 month follow up . She was started on low dose prednisone in August with improvement in symptoms , decreased  dyspnea and increased activity tolerance. Last ov she was changed to prednisone 52m to see if higher dose did even better however she had leg swelling and went back to 173mdaily .  Feels breathing is not doing as good as it was. Activity level is down.  Remains on Oxygen 4l/m . No significant change  Gets winded with minimal activity . Wears out easily.  She feels that she is about the same . Feels the cold weather is making her feel worse.  Denies increased cough or wheezing .   Wants refill on cream for buttocks / perineum .    Allergies  Allergen Reactions  . Doxycycline Nausea Only  . Levaquin [Levofloxacin In D5w]     REACTION: leg weakness  . Amoxicillin     REACTION: upset stomach  . Lisinopril     cough  . Metoprolol     diarrhea    Immunization History  Administered Date(s) Administered  . Influenza Split 04/15/2012, 03/20/2014  . Influenza, High Dose Seasonal PF 03/09/2015  . Influenza-Unspecified 03/18/2013, 02/20/2016  . Pneumococcal Conjugate-13 05/03/2014  . Pneumococcal Polysaccharide-23 06/16/2010  . Pneumococcal-Unspecified 03/18/2013    Past Medical History:  Diagnosis Date  . Atrial fibrillation (HCC)    Paroxysmal  . Cystitis 1974  . DJD (degenerative joint disease)    Dr TrCharlestine Night. Hyperlipidemia 2005   NMR LIPOPROFILE: LDL 144 (1570/732), HDL 61, TG 103.,  LDL GOAL = < 130  . Idiopathic pulmonary fibrosis    IDIOPATHIC vs CHRONIC ERD  . Left bundle branch block   . Obstructive sleep apnea    CPAP Intolerant  . Thyroid disease    HYPOTHROIDISM    Tobacco History: History  Smoking Status  . Former Smoker  . Packs/day: 0.25  . Years: 30.00  . Types: Cigarettes  . Quit date: 06/04/1965  Smokeless Tobacco  . Never Used    Comment: smoked 1953-1967, up to < 1/4 ppd   Counseling given: Not Answered   Outpatient Encounter Prescriptions as of 06/19/2016  Medication Sig  . acetaminophen (TYLENOL) 325 MG tablet Take 325 mg by mouth every 6  (six) hours as needed for pain.  Marland Kitchen albuterol (PROVENTIL HFA;VENTOLIN HFA) 108 (90 Base) MCG/ACT inhaler Inhale 2 puffs into the lungs every 6 (six) hours as needed for wheezing or shortness of breath.  . furosemide (LASIX) 20 MG tablet Take 1 tablet daily  . levothyroxine (SYNTHROID, LEVOTHROID) 50 MCG tablet Take 1 tablet (50 mcg total) by mouth daily before breakfast.  . Multiple Vitamins-Minerals (PRESERVISION AREDS 2 PO) Take 1 tablet by mouth daily.  Marland Kitchen nystatin (MYCOSTATIN/NYSTOP) 100000 UNIT/GM POWD Apply twice a day as needed  . predniSONE (DELTASONE) 10 MG tablet Take 1 tablet (10 mg total) by mouth daily with breakfast.  . warfarin (COUMADIN) 5 MG tablet Take 1 tablet (5 mg total) by mouth as directed. Take as directed by anticoagulation clinic  . [DISCONTINUED] albuterol (PROVENTIL HFA;VENTOLIN HFA) 108 (90 BASE) MCG/ACT inhaler Inhale 2 puffs into the lungs every 6 (six) hours as needed for wheezing or shortness of breath.  . clotrimazole-betamethasone (LOTRISONE) cream Apply 1 application topically 2 (two) times daily.  . [DISCONTINUED] predniSONE (DELTASONE) 10 MG tablet Take 1 tablet (10 mg total) by mouth daily with breakfast.  . [DISCONTINUED] predniSONE (DELTASONE) 10 MG tablet Take 2 tablets daily for 3 days followed by 1 tablet daily until follow up.   No facility-administered encounter medications on file as of 06/19/2016.      Review of Systems  Constitutional:   No  weight loss, night sweats,  Fevers, chills,  +fatigue, or  lassitude.  HEENT:   No headaches,  Difficulty swallowing,  Tooth/dental problems, or  Sore throat,                No sneezing, itching, ear ache, nasal congestion, post nasal drip,   CV:  No chest pain,  Orthopnea, PND, swelling in lower extremities, anasarca, dizziness, palpitations, syncope.   GI  No heartburn, indigestion, abdominal pain, nausea, vomiting, diarrhea, change in bowel habits, loss of appetite, bloody stools.   Resp:    No chest  wall deformity  Skin: no rash or lesions.  GU: no dysuria, change in color of urine, no urgency or frequency.  No flank pain, no hematuria   MS:  No joint pain or swelling.  No decreased range of motion.  No back pain.    Physical Exam  BP 100/60 (BP Location: Left Arm)   Pulse 75   Ht _0  (1.702 m)   Wt 135 lb (61.2 kg)   SpO2 90%   BMI 21.14 kg/m   GEN: A/Ox3; pleasant , NAD, elderly , on O2    HEENT:  [employer]/AT,  EACs-clear, TMs-wnl, NOSE-clear, THROAT-clear, no lesions, no postnasal drip or exudate noted.   NECK:  Supple w/ fair ROM; no JVD; normal carotid impulses w/o bruits; no thyromegaly or nodules  palpated; no lymphadenopathy.    RESP  BB crackles ,  no accessory muscle use, no dullness to percussion  CARD:  RRR, no m/r/g, 1+  peripheral edema, pulses intact, no cyanosis or clubbing.  GI:   Soft & nt; nml bowel sounds; no organomegaly or masses detected.   Musco: Warm bil, no deformities or joint swelling noted.   Neuro: alert, no focal deficits noted.    Skin: Warm,  Along perineum area, mild excoriation secondary to cutaneous candidia   Psych:  No change in mood or affect. No depression or anxiety.  No memory loss.  Lab Results:  CBC    Component Value Date/Time   WBC 11.5 (H) 03/09/2015 1509   RBC 4.76 03/09/2015 1509   HGB 15.1 (H) 03/09/2015 1509   HCT 45.7 03/09/2015 1509   PLT 269.0 03/09/2015 1509   MCV 95.9 03/09/2015 1509   MCH 31.9 05/20/2011 1530   MCHC 33.0 03/09/2015 1509   RDW 14.0 03/09/2015 1509   LYMPHSABS 2.9 03/09/2015 1509   MONOABS 1.0 03/09/2015 1509   EOSABS 0.7 03/09/2015 1509   BASOSABS 0.1 03/09/2015 1509    BMET    Component Value Date/Time   NA 142 01/27/2016 1225   K 4.2 01/27/2016 1225   CL 107 01/27/2016 1225   CO2 28 01/27/2016 1225   GLUCOSE 91 01/27/2016 1225   BUN 27 (H) 01/27/2016 1225   CREATININE 1.34 (H) 01/27/2016 1225   CREATININE 1.11 (H) 08/29/2015 1636   CALCIUM 8.4 01/27/2016 1225   GFRNONAA  45 (L) 11/07/2012 1326   GFRAA 53 (L) 11/07/2012 1326    BNP    Component Value Date/Time   BNP 229.4 (H) 08/29/2015 1636    ProBNP    Component Value Date/Time   PROBNP 128.0 (H) 11/28/2012 1020    Imaging: No results found.   Assessment & Plan:   PULMONARY FIBROSIS Stable , unable to tolerate higher dose of prednisone , cont on 65m daily   Plan  Patient Instructions  Continue on oxygen 4l/m .  Use continuous flow with walking .  Stay on Prednisone 130mdaily .  Follow up with Dr. AlElsworth SohoIn 3 months and As needed   Please contact office for sooner follow up if symptoms do not improve or worsen or seek emergency care  Lotrisone cream apply to rash daily for 2 weeks, if not better see Primary MD.      Chronic respiratory failure ([place] on O2 .       TaRexene EdisonNP 06/19/2016

## 2016-06-24 NOTE — Progress Notes (Signed)
Reviewed & agree with plan  

## 2016-07-20 ENCOUNTER — Ambulatory Visit: Payer: Medicare Other

## 2016-08-03 ENCOUNTER — Ambulatory Visit (INDEPENDENT_AMBULATORY_CARE_PROVIDER_SITE_OTHER): Payer: Medicare Other | Admitting: General Practice

## 2016-08-03 DIAGNOSIS — Z5181 Encounter for therapeutic drug level monitoring: Secondary | ICD-10-CM | POA: Diagnosis not present

## 2016-08-03 DIAGNOSIS — I4891 Unspecified atrial fibrillation: Secondary | ICD-10-CM

## 2016-08-03 LAB — POCT INR: INR: 3.5

## 2016-08-03 NOTE — Progress Notes (Signed)
I have reviewed and agree with the plan. 

## 2016-08-03 NOTE — Patient Instructions (Signed)
Pre visit review using our clinic review tool, if applicable. No additional management support is needed unless otherwise documented below in the visit note. 

## 2016-08-20 ENCOUNTER — Other Ambulatory Visit: Payer: Self-pay | Admitting: Internal Medicine

## 2016-09-07 ENCOUNTER — Ambulatory Visit (INDEPENDENT_AMBULATORY_CARE_PROVIDER_SITE_OTHER): Payer: Medicare Other | Admitting: General Practice

## 2016-09-07 DIAGNOSIS — I4891 Unspecified atrial fibrillation: Secondary | ICD-10-CM

## 2016-09-07 DIAGNOSIS — Z5181 Encounter for therapeutic drug level monitoring: Secondary | ICD-10-CM | POA: Diagnosis not present

## 2016-09-07 LAB — POCT INR: INR: 2.8

## 2016-09-07 NOTE — Progress Notes (Signed)
I have reviewed and agree with the plan. 

## 2016-09-07 NOTE — Patient Instructions (Signed)
Pre visit review using our clinic review tool, if applicable. No additional management support is needed unless otherwise documented below in the visit note. 

## 2016-09-11 ENCOUNTER — Ambulatory Visit (INDEPENDENT_AMBULATORY_CARE_PROVIDER_SITE_OTHER)
Admission: RE | Admit: 2016-09-11 | Discharge: 2016-09-11 | Disposition: A | Discharge status: Home / Self Care | Payer: Medicare Other | Source: Ambulatory Visit | Attending: Pulmonary Disease | Admitting: Pulmonary Disease

## 2016-09-11 ENCOUNTER — Ambulatory Visit (INDEPENDENT_AMBULATORY_CARE_PROVIDER_SITE_OTHER): Payer: Medicare Other | Admitting: Pulmonary Disease

## 2016-09-11 ENCOUNTER — Other Ambulatory Visit (INDEPENDENT_AMBULATORY_CARE_PROVIDER_SITE_OTHER): Payer: Medicare Other

## 2016-09-11 ENCOUNTER — Encounter: Payer: Self-pay | Admitting: Pulmonary Disease

## 2016-09-11 VITALS — BP 116/68 | HR 73 | Ht 67.0 in | Wt 136.0 lb

## 2016-09-11 DIAGNOSIS — J9611 Chronic respiratory failure with hypoxia: Secondary | ICD-10-CM

## 2016-09-11 DIAGNOSIS — J8410 Pulmonary fibrosis, unspecified: Secondary | ICD-10-CM | POA: Diagnosis not present

## 2016-09-11 LAB — BASIC METABOLIC PANEL
BUN: 26 mg/dL — AB (ref 6–23)
CHLORIDE: 105 meq/L (ref 96–112)
CO2: 28 meq/L (ref 19–32)
Calcium: 8.9 mg/dL (ref 8.4–10.5)
Creatinine, Ser: 1.37 mg/dL — ABNORMAL HIGH (ref 0.40–1.20)
GFR: 39.16 mL/min — ABNORMAL LOW (ref >60.00)
GLUCOSE: 101 mg/dL — AB (ref 70–99)
POTASSIUM: 4.5 meq/L (ref 3.5–5.1)
Sodium: 142 mEq/L (ref 135–145)

## 2016-09-11 LAB — CBC WITH DIFFERENTIAL/PLATELET
BASOS ABS: 0.2 10*3/uL — AB (ref 0.0–0.1)
Basophils Relative: 1.5 % (ref 0.0–3.0)
EOS ABS: 0.5 10*3/uL (ref 0.0–0.7)
Eosinophils Relative: 4.1 % (ref 0.0–5.0)
HEMATOCRIT: 46.3 % — AB (ref 36.0–46.0)
HEMOGLOBIN: 15.3 g/dL — AB (ref 12.0–15.0)
LYMPHS PCT: 24 % (ref 12.0–46.0)
Lymphs Abs: 2.7 10*3/uL (ref 0.7–4.0)
MCHC: 33.1 g/dL (ref 30.0–36.0)
MCV: 97.1 fl (ref 78.0–100.0)
Monocytes Absolute: 0.7 10*3/uL (ref 0.1–1.0)
Monocytes Relative: 6.6 % (ref 3.0–12.0)
Neutro Abs: 7.2 10*3/uL (ref 1.4–7.7)
Neutrophils Relative %: 63.8 % (ref 43.0–77.0)
PLATELETS: 239 10*3/uL (ref 150.0–400.0)
RBC: 4.77 Mil/uL (ref 3.87–5.11)
RDW: 15.2 % (ref 11.5–15.5)
WBC: 11.4 10*3/uL — AB (ref 4.0–10.5)

## 2016-09-11 LAB — BRAIN NATRIURETIC PEPTIDE: PRO B NATRI PEPTIDE: 914 pg/mL — AB (ref 0.0–100.0)

## 2016-09-11 MED ORDER — PREDNISONE 10 MG PO TABS
ORAL_TABLET | ORAL | 0 refills | Status: DC
Start: 2016-09-11 — End: 2016-12-19

## 2016-09-11 NOTE — Assessment & Plan Note (Addendum)
We will ask [facility] to look into portable concentrator for you  Concern for cor pulmonale She is clearly worsening

## 2016-09-11 NOTE — Patient Instructions (Signed)
Blood work and chest x-ray today We will ask [facility] to look into portable concentrator for you  Prednisone 10 mg tablets Take 4 tabs  daily with food x 4 days, then 3 tabs daily x 4 days, then 2 tabs daily x 4 days, then stay on 1 tab daily  #60  Take Lasix 20 mg daily for 10 days then every other day

## 2016-09-11 NOTE — Addendum Note (Signed)
Addended by: Maurene Capes on: 09/11/2016 02:52 PM   Modules accepted: Orders

## 2016-09-11 NOTE — Assessment & Plan Note (Signed)
Treat as flare Blood work and chest x-ray today   Prednisone 10 mg tablets Take 4 tabs  daily with food x 4 days, then 3 tabs daily x 4 days, then 2 tabs daily x 4 days, then stay on 1 tab daily  #60  Take Lasix 20 mg daily for 10 days then every other day

## 2016-09-11 NOTE — Progress Notes (Signed)
Subjective:    Patient ID: [name], female    DOB: 18-Feb-1932, 83 y.o.   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 She was diagnosed by Dr. Patsey Berthold in 2006. RA factor positive -seen by Dr. Charlestine Night and he felt that her symptoms were more related to osteoarthritis      She has nonischemic cardiomyopathy -Caryl Comes  09/11/2016  Chief Complaint  Patient presents with  . Follow-up    81 month follow up.    In wheelchair today accompanied by her husband She Had some response to prednisone,  no dependent since 01/2016 In November 2017, we increased to 20 mg, but she developed increasing leg swelling and was dropped down to 10 mg in December  She is compliant with her oxygen and uses up to 4 L on exertion -she requests portable concentrator today  Pedal edema is increased, she does not use of Lasix every day  Reports increasing dyspnea overall and has been using her oxygen more No cough or fever or sick contacts       Significant tests/ events  11/2012 She developed proximal atrial fibrillation and underwent DCCV >>NSR  echo showed EF improved from 35% to 45%.  03/2013 stress test - no ischemia.   PSG in 2006 showed mild obstructive sleep apnea with AHI of 10 events per hour in desaturation to 80% with a few periodic limb movements  12/2012 RA factor 90, CCP neg, ANA neg, ESR 16   CT chest in December 2006 showed bilateral peripheral interstitial disease with honeycombing consistent with IPF. There was also a 10x6 mm density in the right middle lobe . 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   PFTs 12/2012 - FVC 70%, TLC 61% , DLCO 35%, no BD response   PFTs 10/2014 >> FVC 70%, TLC 62% -stable, DLCO slight drop to 27%    08/19/2014 Acute OV - O2 sats 87% on RA walking, longer to rebound.  Walk  test required 4l/ with walking to keep sats >90%.   CPST -09/2015 showed FVC 65%. , VO2 62%, cardiac limitation,surprisingly no ventilatory limitation to exercise   Past Medical History:  Diagnosis Date  . Atrial fibrillation (HCC)    Paroxysmal  . Cystitis 1974  . DJD (degenerative joint disease)    Dr Charlestine Night  . Hyperlipidemia 2005   NMR LIPOPROFILE: LDL 144 (1570/732), HDL 61, TG 103., LDL GOAL = < 130  . Idiopathic pulmonary fibrosis    IDIOPATHIC vs CHRONIC ERD  . Left bundle branch block   . Obstructive sleep apnea    CPAP Intolerant  . Thyroid disease    HYPOTHROIDISM     Review of Systems neg for any significant sore throat, dysphagia, itching, sneezing, nasal congestion or excess/ purulent secretions, fever, chills, sweats, unintended wt loss, pleuritic or exertional cp, hempoptysis, orthopnea pnd or change in chronic leg swelling. Also denies presyncope, palpitations, heartburn, abdominal pain, nausea, vomiting, diarrhea or change in bowel or urinary habits, dysuria,hematuria, rash, arthralgias, visual complaints, headache, numbness weakness or ataxia.     Objective:   Physical Exam  Gen. Pleasant, well-nourished, in no distress, normal affect ENT - no lesions, no post nasal drip Neck: No JVD, no thyromegaly, no carotid bruits Lungs: no use of accessory muscles, no dullness to percussion, BL 1/2rales, no rhonchi  Cardiovascular: Rhythm regular, heart sounds  normal, no  murmurs or gallops, no peripheral edema Abdomen: soft and non-tender, no hepatosplenomegaly, BS normal. Musculoskeletal: No deformities, no cyanosis or clubbing Neuro:  alert, non focal       Assessment & Plan:

## 2016-10-05 ENCOUNTER — Ambulatory Visit: Payer: Medicare Other

## 2016-10-07 ENCOUNTER — Other Ambulatory Visit: Payer: Self-pay | Admitting: Internal Medicine

## 2016-10-12 ENCOUNTER — Ambulatory Visit: Payer: Medicare Other

## 2016-10-19 ENCOUNTER — Ambulatory Visit: Payer: Medicare Other

## 2016-10-23 ENCOUNTER — Ambulatory Visit (INDEPENDENT_AMBULATORY_CARE_PROVIDER_SITE_OTHER): Payer: Medicare Other | Admitting: General Practice

## 2016-10-23 DIAGNOSIS — I4891 Unspecified atrial fibrillation: Secondary | ICD-10-CM

## 2016-10-23 DIAGNOSIS — Z5181 Encounter for therapeutic drug level monitoring: Secondary | ICD-10-CM | POA: Diagnosis not present

## 2016-10-23 LAB — POCT INR: INR: 4.1

## 2016-10-23 NOTE — Patient Instructions (Signed)
Pre visit review using our clinic review tool, if applicable. No additional management support is needed unless otherwise documented below in the visit note. 

## 2016-10-23 NOTE — Progress Notes (Signed)
I have reviewed and agree with the plan. 

## 2016-11-23 ENCOUNTER — Ambulatory Visit: Payer: Medicare Other

## 2016-12-11 ENCOUNTER — Ambulatory Visit: Payer: Medicare Other | Admitting: Adult Health

## 2016-12-14 ENCOUNTER — Inpatient Hospital Stay (HOSPITAL_COMMUNITY)
Admission: EM | Admit: 2016-12-14 | Discharge: 2016-12-19 | DRG: 291 | Disposition: A | Discharge status: Home / Self Care | Payer: Medicare Other | Attending: Internal Medicine | Admitting: Internal Medicine

## 2016-12-14 ENCOUNTER — Emergency Department (HOSPITAL_COMMUNITY): Payer: Medicare Other

## 2016-12-14 ENCOUNTER — Encounter (HOSPITAL_COMMUNITY): Payer: Self-pay | Admitting: Emergency Medicine

## 2016-12-14 DIAGNOSIS — I447 Left bundle-branch block, unspecified: Secondary | ICD-10-CM | POA: Diagnosis present

## 2016-12-14 DIAGNOSIS — Z90710 Acquired absence of both cervix and uterus: Secondary | ICD-10-CM

## 2016-12-14 DIAGNOSIS — Z888 Allergy status to other drugs, medicaments and biological substances status: Secondary | ICD-10-CM

## 2016-12-14 DIAGNOSIS — Z9049 Acquired absence of other specified parts of digestive tract: Secondary | ICD-10-CM

## 2016-12-14 DIAGNOSIS — R0602 Shortness of breath: Secondary | ICD-10-CM

## 2016-12-14 DIAGNOSIS — Z881 Allergy status to other antibiotic agents status: Secondary | ICD-10-CM

## 2016-12-14 DIAGNOSIS — I482 Chronic atrial fibrillation: Secondary | ICD-10-CM

## 2016-12-14 DIAGNOSIS — Z87891 Personal history of nicotine dependence: Secondary | ICD-10-CM

## 2016-12-14 DIAGNOSIS — N183 Chronic kidney disease, stage 3 unspecified: Secondary | ICD-10-CM | POA: Diagnosis present

## 2016-12-14 DIAGNOSIS — I248 Other forms of acute ischemic heart disease: Secondary | ICD-10-CM | POA: Diagnosis present

## 2016-12-14 DIAGNOSIS — L8990 Pressure ulcer of unspecified site, unspecified stage: Secondary | ICD-10-CM | POA: Insufficient documentation

## 2016-12-14 DIAGNOSIS — G4733 Obstructive sleep apnea (adult) (pediatric): Secondary | ICD-10-CM | POA: Diagnosis present

## 2016-12-14 DIAGNOSIS — E440 Moderate protein-calorie malnutrition: Secondary | ICD-10-CM | POA: Diagnosis present

## 2016-12-14 DIAGNOSIS — Z6821 Body mass index (BMI) 21.0-21.9, adult: Secondary | ICD-10-CM

## 2016-12-14 DIAGNOSIS — J9621 Acute and chronic respiratory failure with hypoxia: Secondary | ICD-10-CM | POA: Diagnosis not present

## 2016-12-14 DIAGNOSIS — E039 Hypothyroidism, unspecified: Secondary | ICD-10-CM | POA: Diagnosis not present

## 2016-12-14 DIAGNOSIS — L89152 Pressure ulcer of sacral region, stage 2: Secondary | ICD-10-CM | POA: Diagnosis present

## 2016-12-14 DIAGNOSIS — I5023 Acute on chronic systolic (congestive) heart failure: Secondary | ICD-10-CM | POA: Diagnosis present

## 2016-12-14 DIAGNOSIS — Z66 Do not resuscitate: Secondary | ICD-10-CM | POA: Diagnosis present

## 2016-12-14 DIAGNOSIS — Z7901 Long term (current) use of anticoagulants: Secondary | ICD-10-CM

## 2016-12-14 DIAGNOSIS — L89323 Pressure ulcer of left buttock, stage 3: Secondary | ICD-10-CM | POA: Diagnosis present

## 2016-12-14 DIAGNOSIS — Z825 Family history of asthma and other chronic lower respiratory diseases: Secondary | ICD-10-CM

## 2016-12-14 DIAGNOSIS — J8410 Pulmonary fibrosis, unspecified: Secondary | ICD-10-CM | POA: Diagnosis not present

## 2016-12-14 DIAGNOSIS — R778 Other specified abnormalities of plasma proteins: Secondary | ICD-10-CM | POA: Diagnosis present

## 2016-12-14 DIAGNOSIS — I130 Hypertensive heart and chronic kidney disease with heart failure and stage 1 through stage 4 chronic kidney disease, or unspecified chronic kidney disease: Secondary | ICD-10-CM | POA: Diagnosis not present

## 2016-12-14 DIAGNOSIS — R748 Abnormal levels of other serum enzymes: Secondary | ICD-10-CM | POA: Diagnosis not present

## 2016-12-14 DIAGNOSIS — M419 Scoliosis, unspecified: Secondary | ICD-10-CM | POA: Diagnosis present

## 2016-12-14 DIAGNOSIS — Z809 Family history of malignant neoplasm, unspecified: Secondary | ICD-10-CM

## 2016-12-14 DIAGNOSIS — Z8249 Family history of ischemic heart disease and other diseases of the circulatory system: Secondary | ICD-10-CM

## 2016-12-14 DIAGNOSIS — M1990 Unspecified osteoarthritis, unspecified site: Secondary | ICD-10-CM | POA: Diagnosis present

## 2016-12-14 DIAGNOSIS — R7989 Other specified abnormal findings of blood chemistry: Secondary | ICD-10-CM | POA: Diagnosis present

## 2016-12-14 DIAGNOSIS — Z9981 Dependence on supplemental oxygen: Secondary | ICD-10-CM

## 2016-12-14 DIAGNOSIS — J84112 Idiopathic pulmonary fibrosis: Secondary | ICD-10-CM | POA: Diagnosis present

## 2016-12-14 DIAGNOSIS — I4891 Unspecified atrial fibrillation: Secondary | ICD-10-CM | POA: Diagnosis present

## 2016-12-14 DIAGNOSIS — Z823 Family history of stroke: Secondary | ICD-10-CM

## 2016-12-14 DIAGNOSIS — E785 Hyperlipidemia, unspecified: Secondary | ICD-10-CM | POA: Diagnosis present

## 2016-12-14 LAB — COMPREHENSIVE METABOLIC PANEL
ALBUMIN: 3.2 g/dL — AB (ref 3.5–5.0)
ALK PHOS: 54 U/L (ref 38–126)
ALT: 19 U/L (ref 14–54)
AST: 36 U/L (ref 15–41)
Anion gap: 12 (ref 5–15)
BILIRUBIN TOTAL: 1 mg/dL (ref 0.3–1.2)
BUN: 20 mg/dL (ref 6–20)
CALCIUM: 8.7 mg/dL — AB (ref 8.9–10.3)
CO2: 27 mmol/L (ref 22–32)
CREATININE: 1.45 mg/dL — AB (ref 0.44–1.00)
Chloride: 104 mmol/L (ref 101–111)
GFR calc Af Amer: 38 mL/min — ABNORMAL LOW (ref >60)
GFR, EST NON AFRICAN AMERICAN: 33 mL/min — AB (ref >60)
GLUCOSE: 152 mg/dL — AB (ref 65–99)
Potassium: 3.9 mmol/L (ref 3.5–5.1)
Sodium: 143 mmol/L (ref 135–145)
TOTAL PROTEIN: 6.8 g/dL (ref 6.5–8.1)

## 2016-12-14 LAB — CBC WITH DIFFERENTIAL/PLATELET
BASOS ABS: 0.1 10*3/uL (ref 0.0–0.1)
BASOS PCT: 1 %
Eosinophils Absolute: 0.1 10*3/uL (ref 0.0–0.7)
Eosinophils Relative: 0 %
HCT: 45.9 % (ref 36.0–46.0)
Hemoglobin: 15.4 g/dL — ABNORMAL HIGH (ref 12.0–15.0)
LYMPHS PCT: 26 %
Lymphs Abs: 3.2 10*3/uL (ref 0.7–4.0)
MCH: 33.1 pg (ref 26.0–34.0)
MCHC: 33.6 g/dL (ref 30.0–36.0)
MCV: 98.7 fL (ref 78.0–100.0)
MONO ABS: 0.9 10*3/uL (ref 0.1–1.0)
Monocytes Relative: 7 %
Neutro Abs: 8 10*3/uL — ABNORMAL HIGH (ref 1.7–7.7)
Neutrophils Relative %: 66 %
Platelets: 229 10*3/uL (ref 150–400)
RBC: 4.65 MIL/uL (ref 3.87–5.11)
RDW: 15.1 % (ref 11.5–15.5)
WBC: 12.2 10*3/uL — AB (ref 4.0–10.5)

## 2016-12-14 LAB — BRAIN NATRIURETIC PEPTIDE: B Natriuretic Peptide: 1958.3 pg/mL — ABNORMAL HIGH (ref 0.0–100.0)

## 2016-12-14 LAB — TROPONIN I: TROPONIN I: 0.08 ng/mL — AB (ref <0.03)

## 2016-12-14 MED ORDER — ALBUTEROL SULFATE HFA 108 (90 BASE) MCG/ACT IN AERS
2.0000 | INHALATION_SPRAY | RESPIRATORY_TRACT | Status: DC | PRN
  Filled 2016-12-14: qty 6.7

## 2016-12-14 MED ORDER — IPRATROPIUM-ALBUTEROL 0.5-2.5 (3) MG/3ML IN SOLN
3.0000 mL | Freq: Four times a day (QID) | RESPIRATORY_TRACT | Status: DC
  Administered 2016-12-14: 3 mL via RESPIRATORY_TRACT
  Filled 2016-12-14: qty 3

## 2016-12-14 MED ORDER — FUROSEMIDE 10 MG/ML IJ SOLN
30.0000 mg | Freq: Once | INTRAMUSCULAR | Status: AC
  Administered 2016-12-14: 30 mg via INTRAVENOUS
  Filled 2016-12-14: qty 4

## 2016-12-14 MED ORDER — METHYLPREDNISOLONE SODIUM SUCC 125 MG IJ SOLR
60.0000 mg | Freq: Once | INTRAMUSCULAR | Status: AC
  Administered 2016-12-14: 60 mg via INTRAVENOUS
  Filled 2016-12-14: qty 2

## 2016-12-14 NOTE — ED Notes (Signed)
Date and time results received: 12/14/16  2130.   Test: TROPONIN  Critical Value: 0.08 Name of Provider Notified: LINDSAY, RN NOTIFIED AND DR. LOCKWOOD.  Orders Received? Or Actions Taken?: N/A AT THIS TIME, NO ORDERS

## 2016-12-14 NOTE — ED Notes (Signed)
Bed: RESA Expected date:  Expected time:  Means of arrival:  Comments: 83 yo F  Shortness of breath  CPAP

## 2016-12-14 NOTE — H&P (Signed)
History and Physical    [name] ZOX:[id number] DOB: April 10, 1932 DOA: 12/14/2016  Referring MD/NP/PA:   PCP: Pincus Sanes, MD   Patient coming from:  The patient is coming from home.  At baseline, pt is independent for most of ADL.   Chief Complaint: SOB   HPI: [name] is a 83 y.o. female with medical history significant of hyperlipidemia, chronic respiratory failure on 2-4 liter oxygen at home, hypothyroidism, OSA not on CPAP, left bundle blockage, idiopathic pulmonary fibrosis, atrial fibrillation on Coumadin, sCHF with EF of 35%, who presents with shortness of breath.  Patient states that she has been having shortness of breath in the past 3 days, which has been progressively getting worse. She requires more oxygen. She speaks in full sentence. She has mild dry cough, but no fever or chills. Denies chest pain. Patient does not have nausea, vomiting, diarrhea, abdominal pain, symptoms of UTI or unilateral weakness. No tenderness in the calvarium.  ED Course: pt was found to have positive troponin 0.08, BNP 1958.3, WBC 12.2, slightly worsening renal function, negative FOBT, temperature normal, no tachycardia, tachypnea, oxygen saturation 91-95% on 5 L nasal cannula oxygen ED. Chest x-ray is negative for acute abnormalities. Patient is admitted to telemetry bed as inpatient.  Review of Systems:   General: no fevers, chills, no changes in body weight, has poor appetite, has fatigue HEENT: no blurry vision, hearing changes or sore throat Respiratory: has dyspnea, coughing, no wheezing CV: no chest pain, no palpitations GI: no nausea, vomiting, abdominal pain, diarrhea, constipation GU: no dysuria, burning on urination, increased urinary frequency, hematuria  Ext: has mild leg edema Neuro: no unilateral weakness, numbness, or tingling, no vision change or hearing loss Skin: no rash, no skin tear. MSK: No muscle spasm, no deformity, no limitation of range of movement in  spin Heme: No easy bruising.  Travel history: No recent long distant travel.  Allergy:  Allergies  Allergen Reactions  . Doxycycline Nausea Only  . Levaquin [Levofloxacin In D5w]     REACTION: leg weakness  . Amoxicillin     REACTION: upset stomach  . Lisinopril     cough  . Metoprolol     diarrhea    Past Medical History:  Diagnosis Date  . Atrial fibrillation (HCC)    Paroxysmal  . Cystitis 1974  . DJD (degenerative joint disease)    Dr Kellie Simmering  . Hyperlipidemia 2005   NMR LIPOPROFILE: LDL 144 (1570/732), HDL 61, TG 103., LDL GOAL = < 130  . Idiopathic pulmonary fibrosis    IDIOPATHIC vs CHRONIC ERD  . Left bundle branch block   . Obstructive sleep apnea    CPAP Intolerant  . Thyroid disease    HYPOTHROIDISM    Past Surgical History:  Procedure Laterality Date  . ABDOMINAL HYSTERECTOMY     Abnormal pap smear; ovaries remaining  . APPENDECTOMY  1966  . CARDIOVERSION N/A 11/07/2012   Procedure: CARDIOVERSION;  Surgeon: Cassell Clement, MD;  Location: [facility] ENDOSCOPY;  Service: Cardiovascular;  Laterality: N/A;  . CHOLECYSTECTOMY  1966  . COLONOSCOPY  2004   negative  . CYSTOSCOPY     Hematuria     Social History:  reports that she quit smoking about 51 years ago. Her smoking use included Cigarettes. She has a 7.50 pack-year smoking history. She has never used smokeless tobacco. She reports that she drinks alcohol. She reports that she does not use drugs.  Family History:  Family History  Problem Relation  Age of Onset  . Coronary artery disease Father   . Transient ischemic attack Father   . Stroke Father 3  . Asthma Mother   . Cancer Daughter        R breast; S/P mastectomy  . Diabetes Neg Hx      Prior to Admission medications   Medication Sig Start Date End Date Taking? Authorizing Provider  albuterol (PROVENTIL HFA;VENTOLIN HFA) 108 (90 Base) MCG/ACT inhaler Inhale 2 puffs into the lungs every 6 (six) hours as needed for wheezing or shortness of  breath. 06/19/16  Yes Parrett, Tammy S, NP  aspirin-sod bicarb-citric acid (ALKA-SELTZER) 325 MG TBEF tablet Take 325 mg by mouth every 6 (six) hours as needed (pain).   Yes [provider]  furosemide (LASIX) 20 MG tablet Take 1 tablet daily Patient taking differently: Take 20 mg by mouth daily.  03/28/16  Yes Oretha Milch, MD  levothyroxine (SYNTHROID, LEVOTHROID) 50 MCG tablet Take 1 tablet (50 mcg total) by mouth daily before breakfast. -- Office visit needed for further refills 10/08/16  Yes Burns, Bobette Mo, MD  predniSONE (DELTASONE) 10 MG tablet Take 4tabs x4days, then 3tabs x4days, then 2tabs x4days, then stay on 1 tab daily. Patient taking differently: Take 10 mg by mouth daily with breakfast.  09/11/16  Yes Oretha Milch, MD  warfarin (COUMADIN) 5 MG tablet TAKE ONE TABLET BY MOUTH AS DIRECTED BY  ANTICOAGULATION CLINIC Patient taking differently: TAKE ONE TABLET (5 MG) BY MOUTH ON MON AND FRI AND TAKE 0.5 TABLET (2.5 MG) THE REMAINING DAYS OF THE WEEK 08/20/16  Yes Burns, Bobette Mo, MD  acetaminophen (TYLENOL) 325 MG tablet Take 325 mg by mouth every 6 (six) hours as needed for pain.    [provider]  clotrimazole-betamethasone (LOTRISONE) cream Apply 1 application topically 2 (two) times daily. Patient not taking: Reported on 12/14/2016 06/19/16   Parrett, Virgel Bouquet, NP  levothyroxine (SYNTHROID, LEVOTHROID) 50 MCG tablet Take 1 tablet (50 mcg total) by mouth daily before breakfast. Patient not taking: Reported on 12/14/2016 12/19/15   Pincus Sanes, MD  nystatin (MYCOSTATIN/NYSTOP) 100000 UNIT/GM POWD Apply twice a day as needed Patient taking differently: Apply 1 Bottle topically 2 (two) times daily as needed (skin irritation).  09/12/15   Pincus Sanes, MD  predniSONE (DELTASONE) 10 MG tablet Take 1 tablet (10 mg total) by mouth daily with breakfast. Patient not taking: Reported on 12/14/2016 03/28/16   Oretha Milch, MD    Physical Exam: Vitals:   12/15/16 0230  12/15/16 0246 12/15/16 0300 12/15/16 0350  BP: (!) 142/76  (!) 145/70   Pulse: 73  74   Resp: 20  (!) 24   Temp:      TempSrc:    (P) Oral  SpO2: 96% 95% 94%    General: Not in acute distress HEENT:       Eyes: PERRL, EOMI, no scleral icterus.       ENT: No discharge from the ears and nose, no pharynx injection, no tonsillar enlargement.        Neck: No JVD, no bruit, no mass felt. Heme: No neck lymph node enlargement. Cardiac: S1/S2, RRR, No murmurs, No gallops or rubs. Respiratory: No rales, wheezing, rhonchi or rubs. GI: Soft, nondistended, nontender, no rebound pain, no organomegaly, BS present. GU: No hematuria Ext: 1+ pitting leg edema bilaterally. 2+DP/PT pulse bilaterally. Musculoskeletal: No joint deformities, No joint redness or warmth, no limitation of ROM in spin. Skin: No rashes.  Neuro: Alert, oriented X3, cranial nerves II-XII grossly intact, moves all extremities normally. Psych: Patient is not psychotic, no suicidal or hemocidal ideation.  Labs on Admission: I have personally reviewed following labs and imaging studies  CBC:  Recent Labs Lab 12/14/16 2033  WBC 12.2*  NEUTROABS 8.0*  HGB 15.4*  HCT 45.9  MCV 98.7  PLT 229   Basic Metabolic Panel:  Recent Labs Lab 12/14/16 2033  NA 143  K 3.9  CL 104  CO2 27  GLUCOSE 152*  BUN 20  CREATININE 1.45*  CALCIUM 8.7*   GFR: CrCl cannot be calculated (Unknown ideal weight.). Liver Function Tests:  Recent Labs Lab 12/14/16 2033  AST 36  ALT 19  ALKPHOS 54  BILITOT 1.0  PROT 6.8  ALBUMIN 3.2*   No results for input(s): LIPASE, AMYLASE in the last 168 hours. No results for input(s): AMMONIA in the last 168 hours. Coagulation Profile:  Recent Labs Lab 12/15/16 0149  INR 2.13   Cardiac Enzymes:  Recent Labs Lab 12/14/16 2033 12/15/16 0149  TROPONINI 0.08* 0.11*   BNP (last 3 results)  Recent Labs  09/11/16 1455  PROBNP 914.0*   HbA1C: No results for input(s): HGBA1C in the  last 72 hours. CBG: No results for input(s): GLUCAP in the last 168 hours. Lipid Profile: No results for input(s): CHOL, HDL, LDLCALC, TRIG, CHOLHDL, LDLDIRECT in the last 72 hours. Thyroid Function Tests: No results for input(s): TSH, T4TOTAL, FREET4, T3FREE, THYROIDAB in the last 72 hours. Anemia Panel: No results for input(s): VITAMINB12, FOLATE, FERRITIN, TIBC, IRON, RETICCTPCT in the last 72 hours. Urine analysis:    Component Value Date/Time   COLORURINE Yellow 01/24/2009 0926   APPEARANCEUR CLEAR 01/24/2009 0926   LABSPEC 1.010 01/24/2009 0926   PHURINE 7.0 01/24/2009 0926   GLUCOSEU NEGATIVE 01/24/2009 0926   BILIRUBINUR NEGATIVE 01/24/2009 0926   KETONESUR NEGATIVE 01/24/2009 0926   UROBILINOGEN 0.2 01/24/2009 0926   NITRITE NEGATIVE 01/24/2009 0926   LEUKOCYTESUR TRACE 01/24/2009 0926   Sepsis Labs: @LABRCNTIP (procalcitonin:4,lacticidven:4) )No results found for this or any previous visit (from the past 240 hour(s)).   Radiological Exams on Admission: Dg Chest Port 1 View  Result Date: 12/14/2016 CLINICAL DATA:  Shortness of breath. EXAM: PORTABLE CHEST 1 VIEW COMPARISON:  Radiographs 09/11/2016.  CT 12/04/2012 FINDINGS: Stable cardiomegaly and mediastinal contours. Volume loss in the left hemithorax which appears similar to prior exam. Interstitial lung disease with peripheral fibrosis, unchanged in degree from prior exam. Chronic blunting of left costophrenic angle. No pneumothorax or focal airspace disease. IMPRESSION: 1. Chronic interstitial lung disease, grossly unchanged from prior exam. Pattern consistent with UIP on prior chest CT. 2. Stable cardiomegaly allowing for differences in technique. Chronic left lung volume loss. No evident acute abnormality. Electronically Signed   By: Rubye Oaks M.D.   On: 12/14/2016 20:57     EKG: Independently reviewed. Sinus rhythm, QTC 514, LAD, left bundle blockage    Assessment/Plan Principal Problem:   Acute on  chronic respiratory failure with hypoxia (HCC) Active Problems:   Hypothyroidism   Atrial fibrillation (HCC)   PULMONARY FIBROSIS   Left bundle branch block   Anticoagulant long-term use   CKD (chronic kidney disease), stage III   Acute on chronic systolic (congestive) heart failure (HCC)   Elevated troponin  Acute on chronic respiratory failure with hypoxia: Most likely due to CHF exacerbation given elevated BNP 1958 and leg edema. She may also has IPF exacerbation. 2-D echo on 10/31/12 showed EF of  35%.  -will admit to tele bed as inpt. -Lasix 40 mg daily by IV (received 30 mg of lasix in ED) -trop x 3 -2d echo -Daily weights -strict I/O's -Low salt diet  Elevated troponin:  Trop 0.08. No CP. Most likely due to demand ischemia secondary to CHF exacerbation. - cycle CE q6 x3 and repeat EKG in the am  - Nitroglycerin, Morphine, and aspirin - Risk factor stratification: will check FLP and A1C  - f/u 2d echo  Hypothyroidism: Last TSH was 3.42 on 09/12/15 -Continue home Synthroid  Atrial Fibrillation: CHA2DS2-VASc Score is 5, needs oral anticoagulation. Patient is on Coumadin, Pradaxa, Eliquis at home. INR is pending on admission. Heart rate is well controlled without using CCB or BB -continue coumadin   PULMONARY FIBROSIS: pt may have mild exacerbation -Nebulizers: scheduled Duoneb and prn albuterol -Solu-Medrol 60 mg IV bid -Mucinex for cough   CKD (chronic kidney disease), stage III: Slightly worsening renal function. Baseline creatinine 1.1-1.3, her creatinine is 1.45. -Follow-up renal function by BMP   DVT ppx: on Coumadin Code Status: DNR (I discussed with patient, and explained the meaning of CODE STATUS. Patient wants to be DNR) Family Communication: None at bed side.   Disposition Plan:  Anticipate discharge back to previous home environment Consults called:  none Admission status: Inpatient/tele     Date of Service 12/15/2016    Lorretta Harp [facility] Pager [phone]  If 7PM-7AM, please contact night-coverage [email or web address] Password [email or web address] 12/15/2016, 4:03 AM

## 2016-12-14 NOTE — ED Provider Notes (Signed)
[facility]-EMERGENCY DEPT Provider Note   CSN: [id number] Arrival date & time: 12/14/16  2029   History   Chief Complaint Chief Complaint  Patient presents with  . Shortness of Breath    HPI [name] is a 83 y.o. female with a past medical history significant for atrial fibrillation, pulmonary fibrosis, scoliosis and hyperlipidemia who presents with shortness of breath and abdominal pain. Patient reports that since last Friday she has had increase oxygen requirement form of 3-4L home oxygen. She has increased her oxygen to 5L with minimal improvement in her symptoms. Patient also reports some abdominal pain and nausea but no vomiting. Patient also reports some sticky stools that have been black. Patient have been taking alka seltzer everyday for back pain secondary to scoliosis. Patient refuses to take oxycodone for her pain due GI discomfort. Patient denies any fever, chills, chest pain, dizziness, or vision changes.  HPI  Past Medical History:  Diagnosis Date  . Atrial fibrillation (HCC)    Paroxysmal  . Cystitis 1974  . DJD (degenerative joint disease)    Dr Kellie Simmering  . Hyperlipidemia 2005   NMR LIPOPROFILE: LDL 144 (1570/732), HDL 61, TG 103., LDL GOAL = < 130  . Idiopathic pulmonary fibrosis    IDIOPATHIC vs CHRONIC ERD  . Left bundle branch block   . Obstructive sleep apnea    CPAP Intolerant  . Thyroid disease    HYPOTHROIDISM    Patient Active Problem List   Diagnosis Date Noted  . Idiopathic scoliosis 09/12/2015  . Chronic respiratory failure (HCC) 08/19/2014  . Essential hypertension 05/04/2014  . Hyperglycemia 04/20/2014  . Encounter for therapeutic drug monitoring 07/08/2013  . Renal insufficiency, mild 04/21/2013  . Nonischemic cardiomyopathy (HCC) 11/28/2012  . Anticoagulant long-term use 10/10/2010  . Left bundle branch block   . PULMONARY FIBROSIS 04/20/2009  . GERD 04/20/2009  . EOSINOPHILIA 02/03/2009  . Atrial fibrillation (HCC) 01/18/2009  .  Hypothyroidism 07/14/2007  . HYPERLIPIDEMIA 07/14/2007    Past Surgical History:  Procedure Laterality Date  . ABDOMINAL HYSTERECTOMY     Abnormal pap smear; ovaries remaining  . APPENDECTOMY  1966  . CARDIOVERSION N/A 11/07/2012   Procedure: CARDIOVERSION;  Surgeon: Cassell Clement, MD;  Location: [facility] ENDOSCOPY;  Service: Cardiovascular;  Laterality: N/A;  . CHOLECYSTECTOMY  1966  . COLONOSCOPY  2004   negative  . CYSTOSCOPY     Hematuria     OB History    No data available       Home Medications    Prior to Admission medications   Medication Sig Start Date End Date Taking? Authorizing Provider  albuterol (PROVENTIL HFA;VENTOLIN HFA) 108 (90 Base) MCG/ACT inhaler Inhale 2 puffs into the lungs every 6 (six) hours as needed for wheezing or shortness of breath. 06/19/16  Yes Parrett, Tammy S, NP  aspirin-sod bicarb-citric acid (ALKA-SELTZER) 325 MG TBEF tablet Take 325 mg by mouth every 6 (six) hours as needed (pain).   Yes [provider]  furosemide (LASIX) 20 MG tablet Take 1 tablet daily Patient taking differently: Take 20 mg by mouth daily.  03/28/16  Yes Oretha Milch, MD  levothyroxine (SYNTHROID, LEVOTHROID) 50 MCG tablet Take 1 tablet (50 mcg total) by mouth daily before breakfast. -- Office visit needed for further refills 10/08/16  Yes Burns, Bobette Mo, MD  predniSONE (DELTASONE) 10 MG tablet Take 4tabs x4days, then 3tabs x4days, then 2tabs x4days, then stay on 1 tab daily. Patient taking differently: Take 10 mg by mouth daily  with breakfast.  09/11/16  Yes Oretha Milch, MD  warfarin (COUMADIN) 5 MG tablet TAKE ONE TABLET BY MOUTH AS DIRECTED BY  ANTICOAGULATION CLINIC Patient taking differently: TAKE ONE TABLET (5 MG) BY MOUTH ON MON AND FRI AND TAKE 0.5 TABLET (2.5 MG) THE REMAINING DAYS OF THE WEEK 08/20/16  Yes Burns, Bobette Mo, MD  acetaminophen (TYLENOL) 325 MG tablet Take 325 mg by mouth every 6 (six) hours as needed for pain.    [provider]    clotrimazole-betamethasone (LOTRISONE) cream Apply 1 application topically 2 (two) times daily. Patient not taking: Reported on 12/14/2016 06/19/16   Parrett, Virgel Bouquet, NP  levothyroxine (SYNTHROID, LEVOTHROID) 50 MCG tablet Take 1 tablet (50 mcg total) by mouth daily before breakfast. Patient not taking: Reported on 12/14/2016 12/19/15   Pincus Sanes, MD  nystatin (MYCOSTATIN/NYSTOP) 100000 UNIT/GM POWD Apply twice a day as needed Patient taking differently: Apply 1 Bottle topically 2 (two) times daily as needed (skin irritation).  09/12/15   Pincus Sanes, MD  predniSONE (DELTASONE) 10 MG tablet Take 1 tablet (10 mg total) by mouth daily with breakfast. Patient not taking: Reported on 12/14/2016 03/28/16   Oretha Milch, MD    Family History Family History  Problem Relation Age of Onset  . Coronary artery disease Father   . Transient ischemic attack Father   . Stroke Father 15  . Asthma Mother   . Cancer Daughter        R breast; S/P mastectomy  . Diabetes Neg Hx     Social History Social History  Substance Use Topics  . Smoking status: Former Smoker    Packs/day: 0.25    Years: 30.00    Types: Cigarettes    Quit date: 06/04/1965  . Smokeless tobacco: Never Used     Comment: smoked 1953-1967, up to < 1/4 ppd  . Alcohol use Yes     Comment: Socially only     Allergies   Doxycycline; Levaquin [levofloxacin in d5w]; Amoxicillin; Lisinopril; and Metoprolol   Review of Systems Review of Systems  Constitutional: Negative.   HENT: Negative.   Eyes: Negative.   Respiratory: Positive for shortness of breath. Negative for wheezing.   Cardiovascular: Negative for chest pain.  Gastrointestinal: Positive for abdominal pain and nausea.  Endocrine: Negative.   Genitourinary: Negative.   Musculoskeletal: Negative.   Allergic/Immunologic: Negative.   Neurological: Negative.   Hematological: Negative.   Psychiatric/Behavioral: Negative.      Physical Exam Updated Vital  Signs BP (!) 150/87   Pulse 70   Resp (!) 46   SpO2 95%   Physical Exam  Constitutional: She is oriented to person, place, and time. She appears well-developed and well-nourished.  HENT:  Head: Normocephalic and atraumatic.  Eyes: Pupils are equal, round, and reactive to light.  Neck: Normal range of motion. Neck supple.  Cardiovascular: Normal rate and regular rhythm.   Pulmonary/Chest: Effort normal and breath sounds normal. No respiratory distress. She has no wheezes.  Abdominal: Soft. Bowel sounds are normal. She exhibits no distension.  Musculoskeletal: Normal range of motion.  Neurological: She is alert and oriented to person, place, and time.  Psychiatric: She has a normal mood and affect. Her behavior is normal.     ED Treatments / Results  Labs (all labs ordered are listed, but only abnormal results are displayed) Labs Reviewed  COMPREHENSIVE METABOLIC PANEL - Abnormal; Notable for the following:       Result Value  Glucose, Bld 152 (*)    Creatinine, Ser 1.45 (*)    Calcium 8.7 (*)    Albumin 3.2 (*)    GFR calc non Af Amer 33 (*)    GFR calc Af Amer 38 (*)    All other components within normal limits  CBC WITH DIFFERENTIAL/PLATELET - Abnormal; Notable for the following:    WBC 12.2 (*)    Hemoglobin 15.4 (*)    Neutro Abs 8.0 (*)    All other components within normal limits  TROPONIN I - Abnormal; Notable for the following:    Troponin I 0.08 (*)    All other components within normal limits  BRAIN NATRIURETIC PEPTIDE - Abnormal; Notable for the following:    B Natriuretic Peptide 1,958.3 (*)    All other components within normal limits  OCCULT BLOOD X 1 CARD TO LAB, STOOL    EKG  EKG Interpretation None       Radiology Dg Chest Port 1 View  Result Date: 12/14/2016 CLINICAL DATA:  Shortness of breath. EXAM: PORTABLE CHEST 1 VIEW COMPARISON:  Radiographs 09/11/2016.  CT 12/04/2012 FINDINGS: Stable cardiomegaly and mediastinal contours. Volume loss  in the left hemithorax which appears similar to prior exam. Interstitial lung disease with peripheral fibrosis, unchanged in degree from prior exam. Chronic blunting of left costophrenic angle. No pneumothorax or focal airspace disease. IMPRESSION: 1. Chronic interstitial lung disease, grossly unchanged from prior exam. Pattern consistent with UIP on prior chest CT. 2. Stable cardiomegaly allowing for differences in technique. Chronic left lung volume loss. No evident acute abnormality. Electronically Signed   By: Rubye Oaks M.D.   On: 12/14/2016 20:57    Procedures Procedures (including critical care time)  Medications Ordered in ED Medications  ipratropium-albuterol (DUONEB) 0.5-2.5 (3) MG/3ML nebulizer solution 3 mL (3 mLs Nebulization Given 12/14/16 2217)  albuterol (PROVENTIL HFA;VENTOLIN HFA) 108 (90 Base) MCG/ACT inhaler 2 puff (not administered)  furosemide (LASIX) injection 30 mg (not administered)  methylPREDNISolone sodium succinate (SOLU-MEDROL) 125 mg/2 mL injection 60 mg (60 mg Intravenous Given 12/14/16 2217)     Initial Impression / Assessment and Plan / ED Course  Patient is a 83 yo female who presented with increase oxygen requirement for the past week. Patient has pulmonary fibrosis at baseline. Today in the ED patient was placed on 5L and maintained good O2 sat. CXR was unremarkable except for known pulmonary fibrosis with no concern for infectious process. BNP was elevated to ~2000 with last ECHO in 2014 which showed an EF of 35% and diffuse hypokinesis. Patient also report black tarry stool concerning for GI though hemoglobin is 15.2. However patient has been taking NSAIDs for back pain on daily basis. SOB likely secondary to CHF exacerbation though patient appears to not have a formal diagnosis. Started on lasix, given solumedrol and Duoneb treatment and albuterol prn. Discussed case with hospitalist service that will be admitting patient for further management.  I have  reviewed the triage vital signs and the nursing notes.  Pertinent labs & imaging results that were available during my care of the patient were reviewed by me and considered in my medical decision making (see chart for details).     Final Clinical Impressions(s) / ED Diagnoses   Final diagnoses:  SOB (shortness of breath)    New Prescriptions New Prescriptions   No medications on file     Lovena Neighbours, MD 12/15/16 0032    Gerhard Munch, MD 12/15/16 2325

## 2016-12-14 NOTE — ED Triage Notes (Signed)
Per EMS pt coming from home complaining of shortness of breath. Pt reports feeling "bad" and short of breath for the last few days. Labored breathing noted upon arrival. Pt wears oxygen at home. Pt placed on CPAP with EMS prior to arrival and received duoneb, 125mg  Solumedrol, and 2g of Mag. EMS placed an 18g in the left [facility].

## 2016-12-15 ENCOUNTER — Inpatient Hospital Stay (HOSPITAL_COMMUNITY): Payer: Medicare Other

## 2016-12-15 DIAGNOSIS — M1990 Unspecified osteoarthritis, unspecified site: Secondary | ICD-10-CM | POA: Diagnosis present

## 2016-12-15 DIAGNOSIS — J9621 Acute and chronic respiratory failure with hypoxia: Secondary | ICD-10-CM | POA: Diagnosis present

## 2016-12-15 DIAGNOSIS — I5023 Acute on chronic systolic (congestive) heart failure: Secondary | ICD-10-CM | POA: Diagnosis present

## 2016-12-15 DIAGNOSIS — G4733 Obstructive sleep apnea (adult) (pediatric): Secondary | ICD-10-CM | POA: Diagnosis present

## 2016-12-15 DIAGNOSIS — Z7901 Long term (current) use of anticoagulants: Secondary | ICD-10-CM | POA: Diagnosis not present

## 2016-12-15 DIAGNOSIS — E039 Hypothyroidism, unspecified: Secondary | ICD-10-CM | POA: Diagnosis present

## 2016-12-15 DIAGNOSIS — Z823 Family history of stroke: Secondary | ICD-10-CM | POA: Diagnosis not present

## 2016-12-15 DIAGNOSIS — I50811 Acute right heart failure: Secondary | ICD-10-CM | POA: Diagnosis not present

## 2016-12-15 DIAGNOSIS — J8410 Pulmonary fibrosis, unspecified: Secondary | ICD-10-CM | POA: Diagnosis not present

## 2016-12-15 DIAGNOSIS — Z825 Family history of asthma and other chronic lower respiratory diseases: Secondary | ICD-10-CM | POA: Diagnosis not present

## 2016-12-15 DIAGNOSIS — R0602 Shortness of breath: Secondary | ICD-10-CM | POA: Diagnosis present

## 2016-12-15 DIAGNOSIS — R778 Other specified abnormalities of plasma proteins: Secondary | ICD-10-CM | POA: Diagnosis present

## 2016-12-15 DIAGNOSIS — N183 Chronic kidney disease, stage 3 (moderate): Secondary | ICD-10-CM | POA: Diagnosis not present

## 2016-12-15 DIAGNOSIS — I360 Nonrheumatic tricuspid (valve) stenosis: Secondary | ICD-10-CM | POA: Diagnosis not present

## 2016-12-15 DIAGNOSIS — I248 Other forms of acute ischemic heart disease: Secondary | ICD-10-CM | POA: Diagnosis present

## 2016-12-15 DIAGNOSIS — Z881 Allergy status to other antibiotic agents status: Secondary | ICD-10-CM | POA: Diagnosis not present

## 2016-12-15 DIAGNOSIS — N182 Chronic kidney disease, stage 2 (mild): Secondary | ICD-10-CM | POA: Diagnosis not present

## 2016-12-15 DIAGNOSIS — I2729 Other secondary pulmonary hypertension: Secondary | ICD-10-CM | POA: Diagnosis not present

## 2016-12-15 DIAGNOSIS — J84112 Idiopathic pulmonary fibrosis: Secondary | ICD-10-CM | POA: Diagnosis present

## 2016-12-15 DIAGNOSIS — Z888 Allergy status to other drugs, medicaments and biological substances status: Secondary | ICD-10-CM | POA: Diagnosis not present

## 2016-12-15 DIAGNOSIS — Z809 Family history of malignant neoplasm, unspecified: Secondary | ICD-10-CM | POA: Diagnosis not present

## 2016-12-15 DIAGNOSIS — I130 Hypertensive heart and chronic kidney disease with heart failure and stage 1 through stage 4 chronic kidney disease, or unspecified chronic kidney disease: Secondary | ICD-10-CM | POA: Diagnosis present

## 2016-12-15 DIAGNOSIS — I4891 Unspecified atrial fibrillation: Secondary | ICD-10-CM | POA: Diagnosis present

## 2016-12-15 DIAGNOSIS — M419 Scoliosis, unspecified: Secondary | ICD-10-CM | POA: Diagnosis present

## 2016-12-15 DIAGNOSIS — Z90710 Acquired absence of both cervix and uterus: Secondary | ICD-10-CM | POA: Diagnosis not present

## 2016-12-15 DIAGNOSIS — I447 Left bundle-branch block, unspecified: Secondary | ICD-10-CM | POA: Diagnosis present

## 2016-12-15 DIAGNOSIS — N179 Acute kidney failure, unspecified: Secondary | ICD-10-CM | POA: Diagnosis not present

## 2016-12-15 DIAGNOSIS — Z87891 Personal history of nicotine dependence: Secondary | ICD-10-CM | POA: Diagnosis not present

## 2016-12-15 DIAGNOSIS — L8990 Pressure ulcer of unspecified site, unspecified stage: Secondary | ICD-10-CM | POA: Insufficient documentation

## 2016-12-15 DIAGNOSIS — R7989 Other specified abnormal findings of blood chemistry: Secondary | ICD-10-CM

## 2016-12-15 DIAGNOSIS — Z9049 Acquired absence of other specified parts of digestive tract: Secondary | ICD-10-CM | POA: Diagnosis not present

## 2016-12-15 DIAGNOSIS — L89323 Pressure ulcer of left buttock, stage 3: Secondary | ICD-10-CM | POA: Diagnosis present

## 2016-12-15 DIAGNOSIS — Z8249 Family history of ischemic heart disease and other diseases of the circulatory system: Secondary | ICD-10-CM | POA: Diagnosis not present

## 2016-12-15 DIAGNOSIS — E440 Moderate protein-calorie malnutrition: Secondary | ICD-10-CM | POA: Diagnosis present

## 2016-12-15 DIAGNOSIS — E785 Hyperlipidemia, unspecified: Secondary | ICD-10-CM | POA: Diagnosis present

## 2016-12-15 LAB — BASIC METABOLIC PANEL
ANION GAP: 14 (ref 5–15)
BUN: 21 mg/dL — ABNORMAL HIGH (ref 6–20)
CHLORIDE: 105 mmol/L (ref 101–111)
CO2: 24 mmol/L (ref 22–32)
Calcium: 8.6 mg/dL — ABNORMAL LOW (ref 8.9–10.3)
Creatinine, Ser: 1.5 mg/dL — ABNORMAL HIGH (ref 0.44–1.00)
GFR calc Af Amer: 36 mL/min — ABNORMAL LOW (ref >60)
GFR calc non Af Amer: 31 mL/min — ABNORMAL LOW (ref >60)
Glucose, Bld: 166 mg/dL — ABNORMAL HIGH (ref 65–99)
POTASSIUM: 3.9 mmol/L (ref 3.5–5.1)
SODIUM: 143 mmol/L (ref 135–145)

## 2016-12-15 LAB — TROPONIN I
TROPONIN I: 0.14 ng/mL — AB (ref <0.03)
TROPONIN I: 0.13 ng/mL — AB (ref <0.03)
Troponin I: 0.11 ng/mL (ref <0.03)

## 2016-12-15 LAB — PROTIME-INR
INR: 2.13
PROTHROMBIN TIME: 24.2 s — AB (ref 11.4–15.2)

## 2016-12-15 LAB — CBC
HEMATOCRIT: 44.2 % (ref 36.0–46.0)
HEMOGLOBIN: 14.5 g/dL (ref 12.0–15.0)
MCH: 33.2 pg (ref 26.0–34.0)
MCHC: 32.8 g/dL (ref 30.0–36.0)
MCV: 101.1 fL — ABNORMAL HIGH (ref 78.0–100.0)
Platelets: 224 10*3/uL (ref 150–400)
RBC: 4.37 MIL/uL (ref 3.87–5.11)
RDW: 15.2 % (ref 11.5–15.5)
WBC: 8.2 10*3/uL (ref 4.0–10.5)

## 2016-12-15 LAB — MAGNESIUM: MAGNESIUM: 2.3 mg/dL (ref 1.7–2.4)

## 2016-12-15 LAB — LIPID PANEL
CHOL/HDL RATIO: 2.5 ratio
Cholesterol: 166 mg/dL (ref 0–200)
HDL: 66 mg/dL (ref >40)
LDL CALC: 76 mg/dL (ref 0–99)
TRIGLYCERIDES: 118 mg/dL (ref <150)
VLDL: 24 mg/dL (ref 0–40)

## 2016-12-15 LAB — ECHOCARDIOGRAM COMPLETE
HEIGHTINCHES: 67 in
Weight: 2345.69 oz

## 2016-12-15 LAB — POC OCCULT BLOOD, ED: Fecal Occult Bld: NEGATIVE

## 2016-12-15 MED ORDER — ZOLPIDEM TARTRATE 5 MG PO TABS: 5.0000 mg | ORAL_TABLET | Freq: Every evening | ORAL | Status: DC | PRN

## 2016-12-15 MED ORDER — LEVOTHYROXINE SODIUM 50 MCG PO TABS
50.0000 ug | ORAL_TABLET | Freq: Every day | ORAL | Status: DC
  Administered 2016-12-15 – 2016-12-19 (×5): 50 ug via ORAL
  Filled 2016-12-15 (×5): qty 1

## 2016-12-15 MED ORDER — ONDANSETRON HCL 4 MG/2ML IJ SOLN: 4.0000 mg | Freq: Four times a day (QID) | INTRAMUSCULAR | Status: DC | PRN

## 2016-12-15 MED ORDER — SODIUM CHLORIDE 0.9 % IV SOLN: 250.0000 mL | INTRAVENOUS | Status: DC | PRN

## 2016-12-15 MED ORDER — MORPHINE SULFATE (PF) 4 MG/ML IV SOLN: 2.0000 mg | INTRAVENOUS | Status: DC | PRN

## 2016-12-15 MED ORDER — WARFARIN - PHARMACIST DOSING INPATIENT: Freq: Every day | Status: DC

## 2016-12-15 MED ORDER — ACETAMINOPHEN 325 MG PO TABS: 325.0000 mg | ORAL_TABLET | Freq: Four times a day (QID) | ORAL | Status: DC | PRN

## 2016-12-15 MED ORDER — FUROSEMIDE 10 MG/ML IJ SOLN
40.0000 mg | Freq: Two times a day (BID) | INTRAMUSCULAR | Status: DC
  Administered 2016-12-15 – 2016-12-17 (×5): 40 mg via INTRAVENOUS
  Filled 2016-12-15 (×6): qty 4

## 2016-12-15 MED ORDER — IPRATROPIUM-ALBUTEROL 0.5-2.5 (3) MG/3ML IN SOLN
3.0000 mL | Freq: Three times a day (TID) | RESPIRATORY_TRACT | Status: DC
  Administered 2016-12-16 – 2016-12-19 (×11): 3 mL via RESPIRATORY_TRACT
  Filled 2016-12-15 (×11): qty 3

## 2016-12-15 MED ORDER — NYSTATIN 100000 UNIT/GM EX POWD: 1.0000 | Freq: Two times a day (BID) | CUTANEOUS | Status: DC | PRN

## 2016-12-15 MED ORDER — WARFARIN SODIUM 5 MG PO TABS
5.0000 mg | ORAL_TABLET | Freq: Once | ORAL | Status: AC
  Administered 2016-12-15: 5 mg via ORAL
  Filled 2016-12-15: qty 1

## 2016-12-15 MED ORDER — ALBUTEROL SULFATE (2.5 MG/3ML) 0.083% IN NEBU: 2.5000 mg | INHALATION_SOLUTION | RESPIRATORY_TRACT | Status: DC | PRN

## 2016-12-15 MED ORDER — SODIUM CHLORIDE 0.9% FLUSH: 3.0000 mL | INTRAVENOUS | Status: DC | PRN

## 2016-12-15 MED ORDER — IPRATROPIUM-ALBUTEROL 0.5-2.5 (3) MG/3ML IN SOLN
3.0000 mL | Freq: Three times a day (TID) | RESPIRATORY_TRACT | Status: DC
  Administered 2016-12-15: 3 mL via RESPIRATORY_TRACT
  Filled 2016-12-15: qty 3

## 2016-12-15 MED ORDER — IPRATROPIUM-ALBUTEROL 0.5-2.5 (3) MG/3ML IN SOLN
3.0000 mL | RESPIRATORY_TRACT | Status: DC
  Administered 2016-12-15 (×3): 3 mL via RESPIRATORY_TRACT
  Filled 2016-12-15 (×3): qty 3

## 2016-12-15 MED ORDER — FUROSEMIDE 10 MG/ML IJ SOLN
40.0000 mg | Freq: Every day | INTRAMUSCULAR | Status: DC
  Administered 2016-12-15: 40 mg via INTRAVENOUS
  Filled 2016-12-15: qty 4

## 2016-12-15 MED ORDER — NITROGLYCERIN 0.4 MG SL SUBL: 0.4000 mg | SUBLINGUAL_TABLET | SUBLINGUAL | Status: DC | PRN

## 2016-12-15 MED ORDER — DM-GUAIFENESIN ER 30-600 MG PO TB12: 1.0000 | ORAL_TABLET | Freq: Two times a day (BID) | ORAL | Status: DC | PRN

## 2016-12-15 MED ORDER — ALPRAZOLAM 0.25 MG PO TABS: 0.2500 mg | ORAL_TABLET | Freq: Two times a day (BID) | ORAL | Status: DC | PRN

## 2016-12-15 MED ORDER — IPRATROPIUM-ALBUTEROL 0.5-2.5 (3) MG/3ML IN SOLN
3.0000 mL | RESPIRATORY_TRACT | Status: DC
  Administered 2016-12-15: 3 mL via RESPIRATORY_TRACT
  Filled 2016-12-15: qty 3

## 2016-12-15 MED ORDER — SODIUM CHLORIDE 0.9% FLUSH
3.0000 mL | Freq: Two times a day (BID) | INTRAVENOUS | Status: DC
  Administered 2016-12-15 – 2016-12-19 (×10): 3 mL via INTRAVENOUS

## 2016-12-15 MED ORDER — ASPIRIN EC 81 MG PO TBEC
81.0000 mg | DELAYED_RELEASE_TABLET | Freq: Every day | ORAL | Status: DC
  Administered 2016-12-15 – 2016-12-19 (×5): 81 mg via ORAL
  Filled 2016-12-15 (×5): qty 1

## 2016-12-15 MED ORDER — HYDRALAZINE HCL 20 MG/ML IJ SOLN: 5.0000 mg | INTRAMUSCULAR | Status: DC | PRN

## 2016-12-15 MED ORDER — METHYLPREDNISOLONE SODIUM SUCC 125 MG IJ SOLR
60.0000 mg | Freq: Two times a day (BID) | INTRAMUSCULAR | Status: DC
  Administered 2016-12-15 – 2016-12-17 (×5): 60 mg via INTRAVENOUS
  Filled 2016-12-15 (×5): qty 2

## 2016-12-15 NOTE — Consult Note (Signed)
WOC Nurse wound consult note Reason for Consult: Stage 3 pressure injury to left buttocks, stage 2 pressure injury to sacrum.  Present on admission.  CHF exacerbation with edema noted to lower extremities.  Wound type:Pressure and moisture  Pressure Injury POA: Yes Measurement: Left buttocks:  2 cm x 2 cm x 0.2 cm pink and moist 1.5 cm x 1 cm x 0.1 cm to sacrum Wound DTO:IZTI and moist Drainage (amount, consistency, odor) Minimal serosanguinous  No odor.  Periwound:intact Dressing procedure/placement/frequency:Cleanse wounds to buttocks and sacrum with soap and water and pat dry. Apply silicone border foam dressing. Encourage to turn and reposition every two hours.  Elevate legs when possible.  Will not follow at this time.  Please re-consult if needed.  Maple Hudson RN BSN [facility] Pager [phone]

## 2016-12-15 NOTE — Progress Notes (Addendum)
PROGRESS NOTE                                                                                                                                                                                                             Patient Demographics:    [name], is a 83 y.o. female, DOB - [birth year], WUJ:[id number]  Admit date - 12/14/2016   Admitting Physician Lorretta Harp, MD  Outpatient Primary MD for the patient is Pincus Sanes, MD  LOS - 0  Outpatient Specialists: Dr [name]  Chief Complaint  Patient presents with  . Shortness of Breath       Brief Narrative   83 year old female with chronic respiratory failure secondary to idiopathic pulmonary fibrosis on 3-4 L home oxygen, OSA not on CPAP, A. fib on Coumadin, systolic CHF with EF of 35%, hypothyroidism, hyperlipidemia who presented to the ED with about 5 days of progressive shortness of breath, requiring more oxygen and associated with nonproductive cough. Reports having chronic leg edema which has worsened over the same duration.. Patient reports that she had about his for almost 12 hours few days back and the heat may have aggravated her symptoms. No fevers or chills, no orthopnea or PND, no abdominal pain, dizziness, palpitations, nausea, vomiting, bowel or urinary symptoms or weakness. Denies missing her medications, recent travel or illness. In the ED she was found to be in hypoxic respiratory failure requiring 5 L via nasal cannula, chest x-ray unremarkable. BNP of 2000, WBC of 12 K and mildly worsened renal function. Troponin of 0.08. Admitted to medical floor for acute on chronic hypoxic respiratory failure secondary to CHF exacerbation and possible exacerbation of her pulmonary fibrosis.    Subjective:   Patient reports her breathing to be better since admission but  not back to baseline.   Assessment  & Plan :    Principal Problem:   Acute on chronic respiratory  failure with hypoxia (HCC) Acute combination of acute on chronic systolic CHF and exacerbation of her pulmonary fibrosis. Monitor on telemetry. Mildly elevated troponin which I think is due to demand ischemia. Patient denies having any chest pain symptoms. EKG unremarkable. Diuresing with IV Lasix 40 mg twice a day. Strict I/O and daily weight. Monitor renal function.   Active Problems:   Acute on chronic systolic (congestive) heart failure (HCC) Diuresing with  IV Lasix 40 mg twice a day. Strict I/O and daily weight. Monitor lites closely. Continue aspirin. Check 2-D echo.  Idiopathic pulmonary fibrosis Acute worsening. Placed on  IV Solu-Medrol 60 mg every 12 hours and scheduled DuoNeb. Supportive care with antitussives. Sees Dr. Vassie Loll. Wean oxygen to home dose as tolerated.  Elevated troponin Suspect demand ischemia with acute CHF. No chest pain symptoms.    Hypothyroidism Continue Synthroid.    Atrial fibrillation (HCC) Rate controlled. On Coumadin with therapeutic INR.     Acute on CKD (chronic kidney disease), stage III Mildly worsened creatinine this morning. Monitor while on IV Lasix.  Pressure injury to left buttock and sacrum Stage III pressure like and stage II pressure injury to sacrum. Wound care consult appreciated.      Code Status : DO NOT RESUSCITATE  Family Communication  :None at bedside  Disposition Plan  : Home once improved, possibly in the next 48-72 hours  Barriers For Discharge : Active symptoms  Consults  :  None  Procedures  : 2-D echo  DVT Prophylaxis  : Coumadin  Lab Results  Component Value Date   PLT 224 12/15/2016    Antibiotics  :    Anti-infectives    None        Objective:   Vitals:   12/15/16 0350 12/15/16 0827 12/15/16 1054 12/15/16 1302  BP: 139/76   (!) 118/51  Pulse: 86   76  Resp: (!) 30   18  Temp: 98.1 F (36.7 C)   (!) 97.4 F (36.3 C)  TempSrc: Oral   Oral  SpO2: 95% 99% 93% 97%  Weight: 66.5 kg (146 lb 9.7  oz)     Height: 5\' 7"  (1.702 m)       Wt Readings from Last 3 Encounters:  12/15/16 66.5 kg (146 lb 9.7 oz)  09/11/16 61.7 kg (136 lb)  06/19/16 61.2 kg (135 lb)     Intake/Output Summary (Last 24 hours) at 12/15/16 1306 Last data filed at 12/15/16 1200  Gross per 24 hour  Intake              290 ml  Output             1900 ml  Net            -1610 ml     Physical Exam  UGQ:BVQXIHW female, appears fatigued, not in distress  HEENT:Moist mucosa, supple neck  Chest:Coarse crackles bilaterally (L >R)  CVS: N S1&S2, no murmurs, rubs or gallop GI: soft, NT, ND, Musculoskeletal: warm,1+ pitting edema bilaterally      Data Review:    CBC  Recent Labs Lab 12/14/16 2033 12/15/16 0503  WBC 12.2* 8.2  HGB 15.4* 14.5  HCT 45.9 44.2  PLT 229 224  MCV 98.7 101.1*  MCH 33.1 33.2  MCHC 33.6 32.8  RDW 15.1 15.2  LYMPHSABS 3.2  --   MONOABS 0.9  --   EOSABS 0.1  --   BASOSABS 0.1  --     Chemistries   Recent Labs Lab 12/14/16 2033 12/15/16 0724  NA 143 143  K 3.9 3.9  CL 104 105  CO2 27 24  GLUCOSE 152* 166*  BUN 20 21*  CREATININE 1.45* 1.50*  CALCIUM 8.7* 8.6*  MG  --  2.3  AST 36  --   ALT 19  --   ALKPHOS 54  --   BILITOT 1.0  --    ------------------------------------------------------------------------------------------------------------------  Recent Labs  12/15/16 0503  CHOL 166  HDL 66  LDLCALC 76  TRIG 118  CHOLHDL 2.5    Lab Results  Component Value Date   HGBA1C 5.7 04/22/2014   ------------------------------------------------------------------------------------------------------------------ No results for input(s): TSH, T4TOTAL, T3FREE, THYROIDAB in the last 72 hours.  Invalid input(s): FREET3 ------------------------------------------------------------------------------------------------------------------ No results for input(s): VITAMINB12, FOLATE, FERRITIN, TIBC, IRON, RETICCTPCT in the last 72 hours.  Coagulation  profile  Recent Labs Lab 12/15/16 0149  INR 2.13    No results for input(s): DDIMER in the last 72 hours.  Cardiac Enzymes  Recent Labs Lab 12/14/16 2033 12/15/16 0149 12/15/16 0730  TROPONINI 0.08* 0.11* 0.14*   ------------------------------------------------------------------------------------------------------------------    Component Value Date/Time   BNP 1,958.3 (H) 12/14/2016 2033   BNP 229.4 (H) 08/29/2015 1636    Inpatient Medications  Scheduled Meds: . aspirin EC  81 mg Oral Daily  . furosemide  40 mg Intravenous Q12H  . ipratropium-albuterol  3 mL Nebulization Q4H  . levothyroxine  50 mcg Oral QAC breakfast  . methylPREDNISolone (SOLU-MEDROL) injection  60 mg Intravenous Q12H  . sodium chloride flush  3 mL Intravenous Q12H  . Warfarin - Pharmacist Dosing Inpatient   Does not apply q1800   Continuous Infusions: . sodium chloride     PRN Meds:.sodium chloride, acetaminophen, albuterol, ALPRAZolam, dextromethorphan-guaiFENesin, hydrALAZINE, morphine injection, nitroGLYCERIN, nystatin, ondansetron (ZOFRAN) IV, sodium chloride flush, zolpidem  Micro Results No results found for this or any previous visit (from the past 240 hour(s)).  Radiology Reports Dg Chest Port 1 View  Result Date: 12/14/2016 CLINICAL DATA:  Shortness of breath. EXAM: PORTABLE CHEST 1 VIEW COMPARISON:  Radiographs 09/11/2016.  CT 12/04/2012 FINDINGS: Stable cardiomegaly and mediastinal contours. Volume loss in the left hemithorax which appears similar to prior exam. Interstitial lung disease with peripheral fibrosis, unchanged in degree from prior exam. Chronic blunting of left costophrenic angle. No pneumothorax or focal airspace disease. IMPRESSION: 1. Chronic interstitial lung disease, grossly unchanged from prior exam. Pattern consistent with UIP on prior chest CT. 2. Stable cardiomegaly allowing for differences in technique. Chronic left lung volume loss. No evident acute abnormality.  Electronically Signed   By: Rubye Oaks M.D.   On: 12/14/2016 20:57    Time Spent in minutes  20   Eddie North M.D on 12/15/2016 at 1:06 PM  Between 7am to 7pm - Pager - [phone]  After 7pm go to [email or web address] - password [facility]  [facility] -  Office  [phone]

## 2016-12-15 NOTE — Progress Notes (Signed)
  Echocardiogram 2D Echocardiogram has been performed.  Janalyn Harder 12/15/2016, 11:37 AM

## 2016-12-15 NOTE — Progress Notes (Signed)
ANTICOAGULATION CONSULT NOTE - Initial Consult  Pharmacy Consult for Warfarin Indication: atrial fibrillation  Allergies  Allergen Reactions  . Doxycycline Nausea Only  . Levaquin [Levofloxacin In D5w]     REACTION: leg weakness  . Amoxicillin     REACTION: upset stomach  . Lisinopril     cough  . Metoprolol     diarrhea        Vital Signs: Temp: 97.6 F (36.4 C) (07/14 0007) Temp Source: Oral (07/14 0007) BP: 145/70 (07/14 0300) Pulse Rate: 74 (07/14 0300)  Labs:  Recent Labs  12/14/16 2033 12/15/16 0149  HGB 15.4*  --   HCT 45.9  --   PLT 229  --   LABPROT  --  24.2*  INR  --  2.13  CREATININE 1.45*  --   TROPONINI 0.08* 0.11*    CrCl cannot be calculated (Unknown ideal weight.).   Medical History: Past Medical History:  Diagnosis Date  . Atrial fibrillation (HCC)    Paroxysmal  . Cystitis 1974  . DJD (degenerative joint disease)    Dr Kellie Simmering  . Hyperlipidemia 2005   NMR LIPOPROFILE: LDL 144 (1570/732), HDL 61, TG 103., LDL GOAL = < 130  . Idiopathic pulmonary fibrosis    IDIOPATHIC vs CHRONIC ERD  . Left bundle branch block   . Obstructive sleep apnea    CPAP Intolerant  . Thyroid disease    HYPOTHROIDISM    Medications:  Scheduled:  . aspirin EC  81 mg Oral Daily  . furosemide  40 mg Intravenous Daily  . ipratropium-albuterol  3 mL Nebulization TID  . levothyroxine  50 mcg Oral QAC breakfast  . methylPREDNISolone (SOLU-MEDROL) injection  60 mg Intravenous Q12H  . sodium chloride flush  3 mL Intravenous Q12H   Infusions:  . sodium chloride      Assessment: 18 yoF admitted with SOB on chronic warfarin for A-fib. Pharmacy asked to assist with warfarin dosing inpatient. HD 5 mg Mon/Fri and 2.5 mg other days.  INR=2.13.  Goal of Therapy:  INR 2-3   Plan:  Warfarin 5 mg x1 this am Daily PT/INR   Susanne Greenhouse R 12/15/2016,3:28 AM

## 2016-12-16 DIAGNOSIS — N179 Acute kidney failure, unspecified: Secondary | ICD-10-CM

## 2016-12-16 DIAGNOSIS — I50811 Acute right heart failure: Secondary | ICD-10-CM

## 2016-12-16 DIAGNOSIS — N182 Chronic kidney disease, stage 2 (mild): Secondary | ICD-10-CM

## 2016-12-16 LAB — BASIC METABOLIC PANEL
Anion gap: 10 (ref 5–15)
BUN: 27 mg/dL — AB (ref 6–20)
CALCIUM: 8.4 mg/dL — AB (ref 8.9–10.3)
CO2: 27 mmol/L (ref 22–32)
Chloride: 102 mmol/L (ref 101–111)
Creatinine, Ser: 1.43 mg/dL — ABNORMAL HIGH (ref 0.44–1.00)
GFR calc Af Amer: 38 mL/min — ABNORMAL LOW (ref >60)
GFR, EST NON AFRICAN AMERICAN: 33 mL/min — AB (ref >60)
Glucose, Bld: 126 mg/dL — ABNORMAL HIGH (ref 65–99)
POTASSIUM: 4 mmol/L (ref 3.5–5.1)
SODIUM: 139 mmol/L (ref 135–145)

## 2016-12-16 LAB — PROTIME-INR
INR: 2.93
PROTHROMBIN TIME: 31.2 s — AB (ref 11.4–15.2)

## 2016-12-16 MED ORDER — WARFARIN SODIUM 2.5 MG PO TABS
2.5000 mg | ORAL_TABLET | Freq: Once | ORAL | Status: AC
  Administered 2016-12-16: 2.5 mg via ORAL
  Filled 2016-12-16: qty 1

## 2016-12-16 NOTE — Evaluation (Signed)
Physical Therapy Evaluation Patient Details Name: [name] MRN: [id number] DOB: Jul 03, 1931 Today's Date: 12/16/2016   History of Present Illness  [name] is a 83 y.o. female with medical history significant of hyperlipidemia, chronic respiratory failure on 4 liter oxygen at home, hypothyroidism, OSA not on CPAP, left bundle blockage, idiopathic pulmonary fibrosis, atrial fibrillation on Coumadin, sCHF with EF of 35%, who presents with shortness of breath  Clinical Impression  Pt admitted with above diagnosis. Pt currently with functional limitations due to the deficits listed below (see PT Problem List).  Pt will benefit from skilled PT to increase their independence and safety with mobility to allow discharge to the venue listed below.   Pt amb 22' with RW and min assist; limited d/t  cardiopulmonary status; will continue to follow in acute setting    Follow Up Recommendations Home health PT    Equipment Recommendations  None recommended by PT    Recommendations for Other Services       Precautions / Restrictions Precautions Precautions: Fall Precaution Comments: O2      Mobility  Bed Mobility Overal bed mobility: Needs Assistance                Transfers Overall transfer level: Needs assistance Equipment used: Rolling walker (2 wheeled) Transfers: Sit to/from Stand Sit to Stand: Min assist;Min guard         General transfer comment: assist to rise and stabilize  Ambulation/Gait Ambulation/Gait assistance: Min assist Ambulation Distance (Feet): 22 Feet Assistive device: Rolling walker (2 wheeled) Gait Pattern/deviations: Step-through pattern;Decreased stride length;Trunk flexed     General Gait Details: cues for posture and breathing; standing rest x 30sec d/t incr WOB--on 4L throughout--VSS  Stairs            Wheelchair Mobility    Modified Rankin (Stroke Patients Only)       Balance Overall balance assessment: Needs  assistance Sitting-balance support: No upper extremity supported;Feet supported Sitting balance-Leahy Scale: Fair     Standing balance support: Bilateral upper extremity supported Standing balance-Leahy Scale: Poor Standing balance comment: reliant on UEs                             Pertinent Vitals/Pain Pain Assessment: No/denies pain    Home Living Family/patient expects to be discharged to:: Private residence Living Arrangements: Spouse/significant other   Type of Home: House Home Access: Ramped entrance     Home Layout: One level Home Equipment: Environmental consultant - 2 wheels;Cane - single point;Wheelchair - manual  Pt is retired Health visitor      Prior Function Level of Independence: Independent with assistive device(s)         Comments: mostly uses w/c, amb household distances     Higher education careers adviser        Extremity/Trunk Assessment   Upper Extremity Assessment Upper Extremity Assessment: Generalized weakness    Lower Extremity Assessment Lower Extremity Assessment: Generalized weakness       Communication   Communication: No difficulties  Cognition Arousal/Alertness: Awake/alert Behavior During Therapy: WFL for tasks assessed/performed Overall Cognitive Status: Within Functional Limits for tasks assessed                                        General Comments      Exercises     Assessment/Plan  PT Assessment Patient needs continued PT services  PT Problem List Decreased strength;Decreased activity tolerance;Cardiopulmonary status limiting activity       PT Treatment Interventions DME instruction;Gait training;Functional mobility training;Therapeutic activities;Therapeutic exercise;Patient/family education    PT Goals (Current goals can be found in the Care Plan section)  Acute Rehab PT Goals Patient Stated Goal: to get a little stronger PT Goal Formulation: With patient Time For Goal Achievement: 12/23/16 Potential to Achieve  Goals: Good    Frequency Min 3X/week   Barriers to discharge        Co-evaluation               AM-PAC PT "6 Clicks" Daily Activity  Outcome Measure Difficulty turning over in bed (including adjusting bedclothes, sheets and blankets)?: A Little Difficulty moving from lying on back to sitting on the side of the bed? : A Little Difficulty sitting down on and standing up from a chair with arms (e.g., wheelchair, bedside commode, etc,.)?: Total Help needed moving to and from a bed to chair (including a wheelchair)?: A Little Help needed walking in hospital room?: A Little Help needed climbing 3-5 steps with a railing? : A Little 6 Click Score: 16    End of Session Equipment Utilized During Treatment: Gait belt Activity Tolerance: Patient limited by fatigue Patient left: in bed;with call bell/phone within reach;with family/visitor present;with chair alarm set   PT Visit Diagnosis: Muscle weakness (generalized) (M62.81)    Time: [id number] PT Time Calculation (min) (ACUTE ONLY): 19 min   Charges:   PT Evaluation $PT Eval Low Complexity: 1 Procedure     PT G CodesDrucilla Chalet, PT Pager: [phone] 12/16/2016   [facility] 12/16/2016, 3:52 PM

## 2016-12-16 NOTE — Progress Notes (Signed)
PROGRESS NOTE                                                                                                                                                                                                             Patient Demographics:    [name], is a 83 y.o. female, DOB - May 16, 1932, RUE:[id number]  Admit date - 12/14/2016   Admitting Physician Lorretta Harp, MD  Outpatient Primary MD for the patient is Pincus Sanes, MD  LOS - 1  Outpatient Specialists: Dr [name]  Chief Complaint  Patient presents with  . Shortness of Breath       Brief Narrative   83 year old female with chronic respiratory failure secondary to idiopathic pulmonary fibrosis on 3-4 L home oxygen, OSA not on CPAP, A. fib on Coumadin, systolic CHF with EF of 35%, hypothyroidism, hyperlipidemia who presented to the ED with about 5 days of progressive shortness of breath, requiring more oxygen and associated with nonproductive cough. Reports having chronic leg edema which has worsened over the same duration.. Patient reports that she had about his for almost 12 hours few days back and the heat may have aggravated her symptoms. No fevers or chills, no orthopnea or PND, no abdominal pain, dizziness, palpitations, nausea, vomiting, bowel or urinary symptoms or weakness. Denies missing her medications, recent travel or illness. In the ED she was found to be in hypoxic respiratory failure requiring 5 L via nasal cannula, chest x-ray unremarkable. BNP of 2000, WBC of 12 K and mildly worsened renal function. Troponin of 0.08. Admitted to medical floor for acute on chronic hypoxic respiratory failure secondary to CHF exacerbation and possible exacerbation of her pulmonary fibrosis.    Subjective:   Reports her breathing to have improved but he is tired of getting up to go to the bathroom frequently during the night.   Assessment  & Plan :    Principal  Problem:   Acute on chronic respiratory failure with hypoxia (HCC) Combination of right heart failure and worsening of pulmonary fibrosis. 2-D echo with improved LVEF of 55-60% but shows moderate to severely reduced RV systolic function and increased PA pressure of 59 mmHg. -Diuresing well with IV Lasix, continue current dose and monitor strict I/O and daily weight.    Active Problems:   Acute on chronic systolic (congestive) heart failure (HCC)  Worsened right-sided heart failure. Continue IV Lasix 40 mg twice a day, diuresing quite well (-2.6 L since admission)  Idiopathic pulmonary fibrosis Acute worsening.  Continue scheduled nebs. Reduce IV Solu-Medrol dose to 40 mg twice a day. Supportive care with antitussives. Sees Dr. Vassie Loll. Wean oxygen to home dose as tolerated. We discharged on oral prednisone taper.  Elevated troponin Suspect demand ischemia with acute CHF. Troponin peaked at 0.14. No chest pain symptoms.  echo reviewed.    Hypothyroidism Continue Synthroid.    Atrial fibrillation (HCC) Rate controlled. On Coumadin with therapeutic INR.     Acute on CKD (chronic kidney disease), stage III Mildly worsened IV Lasix, continue current dose.  Pressure injury to left buttock and sacrum Stage III pressure like and stage II pressure injury to sacrum. Wound care consult appreciated.  Generalized weakness We'll consult PT and dilatation    Code Status : DO NOT RESUSCITATE  Family Communication  : Skull is with daughter and husband in detail at bedside on 7/14. Disposition Plan  : Home tomorrow if symptoms better.  Barriers For Discharge : Active symptoms  Consults  :  None  Procedures  : 2-D echo  DVT Prophylaxis  : Coumadin    Lab Results  Component Value Date   PLT 224 12/15/2016    Antibiotics  :    Anti-infectives    None        Objective:   Vitals:   12/15/16 1957 12/15/16 2041 12/16/16 0347 12/16/16 0826  BP: (!) 119/53  110/67   Pulse: 75 76 66     Resp: 18 18 18    Temp: 98.4 F (36.9 C)  97.8 F (36.6 C)   TempSrc: Oral  Oral   SpO2: 99% 98% 100% 100%  Weight:   67.1 kg (147 lb 14.9 oz)   Height:        Wt Readings from Last 3 Encounters:  12/16/16 67.1 kg (147 lb 14.9 oz)  09/11/16 61.7 kg (136 lb)  06/19/16 61.2 kg (135 lb)     Intake/Output Summary (Last 24 hours) at 12/16/16 1106 Last data filed at 12/16/16 1000  Gross per 24 hour  Intake              160 ml  Output             2450 ml  Net            -2290 ml     Physical Exam Gen.: Elderly female, not in distress, pleasant HEENT: jvd+, glucose, supple neck Chest: Coarse crackles bilaterally (L>R) CVS: S1 and S2 regular, no murmurs rubs or gallop GI: Soft, nondistended, nontender Musculoskeletal: Trace pitting edema bilaterally (improved since admission)       Data Review:    CBC  Recent Labs Lab 12/14/16 2033 12/15/16 0503  WBC 12.2* 8.2  HGB 15.4* 14.5  HCT 45.9 44.2  PLT 229 224  MCV 98.7 101.1*  MCH 33.1 33.2  MCHC 33.6 32.8  RDW 15.1 15.2  LYMPHSABS 3.2  --   MONOABS 0.9  --   EOSABS 0.1  --   BASOSABS 0.1  --     Chemistries   Recent Labs Lab 12/14/16 2033 12/15/16 0724 12/16/16 0426  NA 143 143 139  K 3.9 3.9 4.0  CL 104 105 102  CO2 27 24 27   GLUCOSE 152* 166* 126*  BUN 20 21* 27*  CREATININE 1.45* 1.50* 1.43*  CALCIUM 8.7* 8.6* 8.4*  MG  --  2.3  --   AST 36  --   --   ALT 19  --   --   ALKPHOS 54  --   --   BILITOT 1.0  --   --    ------------------------------------------------------------------------------------------------------------------  Recent Labs  12/15/16 0503  CHOL 166  HDL 66  LDLCALC 76  TRIG 118  CHOLHDL 2.5    Lab Results  Component Value Date   HGBA1C 5.7 04/22/2014   ------------------------------------------------------------------------------------------------------------------ No results for input(s): TSH, T4TOTAL, T3FREE, THYROIDAB in the last 72 hours.  Invalid input(s):  FREET3 ------------------------------------------------------------------------------------------------------------------ No results for input(s): VITAMINB12, FOLATE, FERRITIN, TIBC, IRON, RETICCTPCT in the last 72 hours.  Coagulation profile  Recent Labs Lab 12/15/16 0149 12/16/16 0426  INR 2.13 2.93    No results for input(s): DDIMER in the last 72 hours.  Cardiac Enzymes  Recent Labs Lab 12/15/16 0149 12/15/16 0730 12/15/16 1342  TROPONINI 0.11* 0.14* 0.13*   ------------------------------------------------------------------------------------------------------------------    Component Value Date/Time   BNP 1,958.3 (H) 12/14/2016 2033   BNP 229.4 (H) 08/29/2015 1636    Inpatient Medications  Scheduled Meds: . aspirin EC  81 mg Oral Daily  . furosemide  40 mg Intravenous Q12H  . ipratropium-albuterol  3 mL Nebulization TID  . levothyroxine  50 mcg Oral QAC breakfast  . methylPREDNISolone (SOLU-MEDROL) injection  60 mg Intravenous Q12H  . sodium chloride flush  3 mL Intravenous Q12H  . warfarin  2.5 mg Oral ONCE-1800  . Warfarin - Pharmacist Dosing Inpatient   Does not apply q1800   Continuous Infusions: . sodium chloride     PRN Meds:.sodium chloride, acetaminophen, albuterol, ALPRAZolam, dextromethorphan-guaiFENesin, hydrALAZINE, morphine injection, nitroGLYCERIN, nystatin, ondansetron (ZOFRAN) IV, sodium chloride flush, zolpidem  Micro Results No results found for this or any previous visit (from the past 240 hour(s)).  Radiology Reports Dg Chest Port 1 View  Result Date: 12/14/2016 CLINICAL DATA:  Shortness of breath. EXAM: PORTABLE CHEST 1 VIEW COMPARISON:  Radiographs 09/11/2016.  CT 12/04/2012 FINDINGS: Stable cardiomegaly and mediastinal contours. Volume loss in the left hemithorax which appears similar to prior exam. Interstitial lung disease with peripheral fibrosis, unchanged in degree from prior exam. Chronic blunting of left costophrenic angle. No  pneumothorax or focal airspace disease. IMPRESSION: 1. Chronic interstitial lung disease, grossly unchanged from prior exam. Pattern consistent with UIP on prior chest CT. 2. Stable cardiomegaly allowing for differences in technique. Chronic left lung volume loss. No evident acute abnormality. Electronically Signed   By: Rubye Oaks M.D.   On: 12/14/2016 20:57    Time Spent in minutes  35   Eddie North M.D on 12/16/2016 at 11:06 AM  Between 7am to 7pm - Pager - [phone]  After 7pm go to [email or web address] - password [facility]  [facility] -  Office  [phone]

## 2016-12-16 NOTE — Progress Notes (Signed)
ANTICOAGULATION CONSULT NOTE -  Pharmacy Consult for Warfarin Indication: atrial fibrillation  Allergies  Allergen Reactions  . Doxycycline Nausea Only  . Levaquin [Levofloxacin In D5w]     REACTION: leg weakness  . Amoxicillin     REACTION: upset stomach  . Lisinopril     cough  . Metoprolol     diarrhea     Height: 5\' 7"  (170.2 cm) Weight: 147 lb 14.9 oz (67.1 kg) IBW/kg (Calculated) : 61.6  Vital Signs: Temp: 97.8 F (36.6 C) (07/15 0347) Temp Source: Oral (07/15 0347) BP: 110/67 (07/15 0347) Pulse Rate: 66 (07/15 0347)  Labs:  Recent Labs  12/14/16 2033 12/15/16 0149 12/15/16 0503 12/15/16 0724 12/15/16 0730 12/15/16 1342 12/16/16 0426  HGB 15.4*  --  14.5  --   --   --   --   HCT 45.9  --  44.2  --   --   --   --   PLT 229  --  224  --   --   --   --   LABPROT  --  24.2*  --   --   --   --  31.2*  INR  --  2.13  --   --   --   --  2.93  CREATININE 1.45*  --   --  1.50*  --   --  1.43*  TROPONINI 0.08* 0.11*  --   --  0.14* 0.13*  --     Estimated Creatinine Clearance: 29.5 mL/min (A) (by C-G formula based on SCr of 1.43 mg/dL (H)).   Medical History: Past Medical History:  Diagnosis Date  . Atrial fibrillation (HCC)    Paroxysmal  . Cystitis 1974  . DJD (degenerative joint disease)    Dr Kellie Simmering  . Hyperlipidemia 2005   NMR LIPOPROFILE: LDL 144 (1570/732), HDL 61, TG 103., LDL GOAL = < 130  . Idiopathic pulmonary fibrosis    IDIOPATHIC vs CHRONIC ERD  . Left bundle branch block   . Obstructive sleep apnea    CPAP Intolerant  . Thyroid disease    HYPOTHROIDISM    Medications:  Scheduled:  . aspirin EC  81 mg Oral Daily  . furosemide  40 mg Intravenous Q12H  . ipratropium-albuterol  3 mL Nebulization TID  . levothyroxine  50 mcg Oral QAC breakfast  . methylPREDNISolone (SOLU-MEDROL) injection  60 mg Intravenous Q12H  . sodium chloride flush  3 mL Intravenous Q12H  . Warfarin - Pharmacist Dosing Inpatient   Does not apply q1800    Infusions:  . sodium chloride      Assessment: 72 yoF admitted with SOB on chronic warfarin for A-fib. Pharmacy asked to assist with warfarin dosing inpatient. HD 5 mg Mon/Fri and 2.5 mg other days.  INR=2.13 on admission  Today, 12/16/16   INR 2.93 therapeutic, PT 31.2  Hgb 14.5, plt 224 ( 7/14)    Goal of Therapy:  INR 2-3   Plan:   Warfarin 2.5 mg x1   Daily PT/INR  Monitor clinical course, renal function, cultures as available    Adalberto Cole, PharmD, BCPS Pager [phone] 12/16/2016 9:05 AM

## 2016-12-17 DIAGNOSIS — I2729 Other secondary pulmonary hypertension: Secondary | ICD-10-CM

## 2016-12-17 LAB — BASIC METABOLIC PANEL
ANION GAP: 7 (ref 5–15)
BUN: 37 mg/dL — ABNORMAL HIGH (ref 6–20)
CHLORIDE: 100 mmol/L — AB (ref 101–111)
CO2: 31 mmol/L (ref 22–32)
Calcium: 8.8 mg/dL — ABNORMAL LOW (ref 8.9–10.3)
Creatinine, Ser: 1.5 mg/dL — ABNORMAL HIGH (ref 0.44–1.00)
GFR calc non Af Amer: 31 mL/min — ABNORMAL LOW (ref >60)
GFR, EST AFRICAN AMERICAN: 36 mL/min — AB (ref >60)
GLUCOSE: 138 mg/dL — AB (ref 65–99)
Potassium: 4.1 mmol/L (ref 3.5–5.1)
Sodium: 138 mmol/L (ref 135–145)

## 2016-12-17 LAB — PROTIME-INR
INR: 2.84
Prothrombin Time: 30.5 seconds — ABNORMAL HIGH (ref 11.4–15.2)

## 2016-12-17 MED ORDER — WARFARIN SODIUM 2.5 MG PO TABS
2.5000 mg | ORAL_TABLET | Freq: Once | ORAL | Status: AC
  Administered 2016-12-17: 2.5 mg via ORAL
  Filled 2016-12-17: qty 1

## 2016-12-17 MED ORDER — PREDNISONE 20 MG PO TABS
40.0000 mg | ORAL_TABLET | Freq: Every day | ORAL | Status: DC
  Administered 2016-12-17 – 2016-12-19 (×3): 40 mg via ORAL
  Filled 2016-12-17 (×3): qty 2

## 2016-12-17 MED ORDER — BOOST PLUS PO LIQD
237.0000 mL | Freq: Three times a day (TID) | ORAL | Status: DC
  Administered 2016-12-17 – 2016-12-19 (×4): 237 mL via ORAL
  Filled 2016-12-17 (×8): qty 237

## 2016-12-17 NOTE — Progress Notes (Addendum)
PROGRESS NOTE                                                                                                                                                                                                             Patient Demographics:    [name], is a 83 y.o. female, DOB - [birth year], ZOX:[id number]  Admit date - 12/14/2016   Admitting Physician Lorretta Harp, MD  Outpatient Primary MD for the patient is Pincus Sanes, MD  LOS - 2  Outpatient Specialists: Dr [name]  Chief Complaint  Patient presents with  . Shortness of Breath       Brief Narrative   83 year old female with chronic respiratory failure secondary to idiopathic pulmonary fibrosis on 3-4 L home oxygen, OSA not on CPAP, A. fib on Coumadin, systolic CHF with EF of 35%, hypothyroidism, hyperlipidemia who presented to the ED with about 5 days of progressive shortness of breath, requiring more oxygen and associated with nonproductive cough. Reports having chronic leg edema which has worsened over the same duration.. Patient reports that she had about his for almost 12 hours few days back and the heat may have aggravated her symptoms. No fevers or chills, no orthopnea or PND, no abdominal pain, dizziness, palpitations, nausea, vomiting, bowel or urinary symptoms or weakness. Denies missing her medications, recent travel or illness. In the ED she was found to be in hypoxic respiratory failure requiring 5 L via nasal cannula, chest x-ray unremarkable. BNP of 2000, WBC of 12 K and mildly worsened renal function. Troponin of 0.08. Admitted to medical floor for acute on chronic hypoxic respiratory failure secondary to CHF exacerbation and possible exacerbation of her pulmonary fibrosis.    Subjective:   Breathing continues to improve but feels very tired.   Assessment  & Plan :    Principal Problem:   Acute on chronic respiratory failure with hypoxia  (HCC) Combination of right heart failure and worsening of pulmonary fibrosis. 2-D echo with improved LVEF of 55-60% but shows moderate to severely reduced RV systolic function and increased PA pressure of 59 mmHg. -Continue IV Lasix for now since his diuresing quite well.  transition to oral Lasix tomorrow.    Active Problems:   Acute on chronic systolic (congestive) heart failure (HCC) Worsened right-sided heart failure. Continue IV Lasix 40 mg twice a day, diuresing  quite well (- 6.4 L since admission)  Idiopathic pulmonary fibrosis Acute worsening.  Continue scheduled nebs. Switch to oral prednisone today. Supportive care with antitussives. Discussed with her pulmonologist Dr. Vassie Loll the who agree with current treatment plan and outpatient follow-up with him or his NP in 2 weeks.  Elevated troponin Suspect demand ischemia with acute CHF. Troponin peaked at 0.14. No chest pain symptoms.  echo reviewed.    Hypothyroidism Continue Synthroid.    Atrial fibrillation (HCC) Rate controlled. On Coumadin with therapeutic INR.     Acute on CKD (chronic kidney disease), stage III Mildly worsened on IV Lasix, continue current dose.  Pressure injury to left buttock and sacrum Stage III pressure like and stage II pressure injury to sacrum. Wound care consult appreciated.  Generalized weakness PT recommends home health.  Moderate protein calorie malnutrition Added supplement.  Code Status : DO NOT RESUSCITATE  Family Communication  : daughter and husband in detail at bedside on 7/14. Disposition Plan  : Home tomorrow if symptoms continue to improve  Barriers For Discharge : Active symptoms  Consults  :  None  Procedures  : 2-D echo  DVT Prophylaxis  : Coumadin    Lab Results  Component Value Date   PLT 224 12/15/2016    Antibiotics  :    Anti-infectives    None        Objective:   Vitals:   12/16/16 2059 12/16/16 2155 12/17/16 0423 12/17/16 0856  BP:  137/65 (!)  129/59   Pulse:  73 (!) 59 (!) 59  Resp:  (!) 24 20 18   Temp:  98 F (36.7 C) 97.7 F (36.5 C)   TempSrc:  Oral Oral   SpO2: 97% 94% 100% 98%  Weight:   61.9 kg (136 lb 7.4 oz)   Height:        Wt Readings from Last 3 Encounters:  12/17/16 61.9 kg (136 lb 7.4 oz)  09/11/16 61.7 kg (136 lb)  06/19/16 61.2 kg (135 lb)     Intake/Output Summary (Last 24 hours) at 12/17/16 1301 Last data filed at 12/17/16 1000  Gross per 24 hour  Intake              240 ml  Output             2800 ml  Net            -2560 ml     Physical Exam Gen.: Elderly female appears fatigued HEENT: Moist mucosa, supple neck Chest: Coarse crackles bilaterally (L>R), no rhonchi or wheeze,  CVS: S1 and S2 irregular, no murmurs GI: Soft, nondistended, nontender Musculoskeletal: Trace pitting edema bilaterally (more in the foot)        Data Review:    CBC  Recent Labs Lab 12/14/16 2033 12/15/16 0503  WBC 12.2* 8.2  HGB 15.4* 14.5  HCT 45.9 44.2  PLT 229 224  MCV 98.7 101.1*  MCH 33.1 33.2  MCHC 33.6 32.8  RDW 15.1 15.2  LYMPHSABS 3.2  --   MONOABS 0.9  --   EOSABS 0.1  --   BASOSABS 0.1  --     Chemistries   Recent Labs Lab 12/14/16 2033 12/15/16 0724 12/16/16 0426 12/17/16 0411  NA 143 143 139 138  K 3.9 3.9 4.0 4.1  CL 104 105 102 100*  CO2 27 24 27 31   GLUCOSE 152* 166* 126* 138*  BUN 20 21* 27* 37*  CREATININE 1.45* 1.50* 1.43* 1.50*  CALCIUM  8.7* 8.6* 8.4* 8.8*  MG  --  2.3  --   --   AST 36  --   --   --   ALT 19  --   --   --   ALKPHOS 54  --   --   --   BILITOT 1.0  --   --   --    ------------------------------------------------------------------------------------------------------------------  Recent Labs  12/15/16 0503  CHOL 166  HDL 66  LDLCALC 76  TRIG 118  CHOLHDL 2.5    Lab Results  Component Value Date   HGBA1C 5.7 04/22/2014    ------------------------------------------------------------------------------------------------------------------ No results for input(s): TSH, T4TOTAL, T3FREE, THYROIDAB in the last 72 hours.  Invalid input(s): FREET3 ------------------------------------------------------------------------------------------------------------------ No results for input(s): VITAMINB12, FOLATE, FERRITIN, TIBC, IRON, RETICCTPCT in the last 72 hours.  Coagulation profile  Recent Labs Lab 12/15/16 0149 12/16/16 0426 12/17/16 0411  INR 2.13 2.93 2.84    No results for input(s): DDIMER in the last 72 hours.  Cardiac Enzymes  Recent Labs Lab 12/15/16 0149 12/15/16 0730 12/15/16 1342  TROPONINI 0.11* 0.14* 0.13*   ------------------------------------------------------------------------------------------------------------------    Component Value Date/Time   BNP 1,958.3 (H) 12/14/2016 2033   BNP 229.4 (H) 08/29/2015 1636    Inpatient Medications  Scheduled Meds: . aspirin EC  81 mg Oral Daily  . furosemide  40 mg Intravenous Q12H  . ipratropium-albuterol  3 mL Nebulization TID  . lactose free nutrition  237 mL Oral TID WC  . levothyroxine  50 mcg Oral QAC breakfast  . predniSONE  40 mg Oral Q breakfast  . sodium chloride flush  3 mL Intravenous Q12H  . warfarin  2.5 mg Oral ONCE-1800  . Warfarin - Pharmacist Dosing Inpatient   Does not apply q1800   Continuous Infusions: . sodium chloride     PRN Meds:.sodium chloride, acetaminophen, albuterol, ALPRAZolam, dextromethorphan-guaiFENesin, hydrALAZINE, morphine injection, nitroGLYCERIN, nystatin, ondansetron (ZOFRAN) IV, sodium chloride flush, zolpidem  Micro Results No results found for this or any previous visit (from the past 240 hour(s)).  Radiology Reports Dg Chest Port 1 View  Result Date: 12/14/2016 CLINICAL DATA:  Shortness of breath. EXAM: PORTABLE CHEST 1 VIEW COMPARISON:  Radiographs 09/11/2016.  CT 12/04/2012 FINDINGS:  Stable cardiomegaly and mediastinal contours. Volume loss in the left hemithorax which appears similar to prior exam. Interstitial lung disease with peripheral fibrosis, unchanged in degree from prior exam. Chronic blunting of left costophrenic angle. No pneumothorax or focal airspace disease. IMPRESSION: 1. Chronic interstitial lung disease, grossly unchanged from prior exam. Pattern consistent with UIP on prior chest CT. 2. Stable cardiomegaly allowing for differences in technique. Chronic left lung volume loss. No evident acute abnormality. Electronically Signed   By: Rubye Oaks M.D.   On: 12/14/2016 20:57    Time Spent in minutes 25   Eddie North M.D on 12/17/2016 at 1:01 PM  Between 7am to 7pm - Pager - [phone]  After 7pm go to [email or web address] - password [facility]  [facility] -  Office  [phone]

## 2016-12-17 NOTE — Care Management Note (Signed)
Case Management Note  Patient Details  Name: [name] MRN: [id number] Date of Birth: 09-01-1931  Subjective/Objective: [facility] rep Selena Batten unable to accept since [facility] may be needed-Patient informed & will assist patient in choosing another [facility] agency in am. Patient voiced understanding. MD if [facility] needed please put in HHRN/HHPT, & f2f orders,.                  Action/Plan:d/c home w/HHC.   Expected Discharge Date:                  Expected Discharge Plan:  Home w Home Health Services  In-House Referral:     Discharge planning Services  CM Consult  Post Acute Care Choice:  Durable Medical Equipment ([facility] home [phone]) Choice offered to:  Patient  DME Arranged:    DME Agency:     HH Arranged:  PT HH Agency:     Status of Service:  In process, will continue to follow  If discussed at Long Length of Stay Meetings, dates discussed:    Additional Comments:  Lanier Clam, RN 12/17/2016, 5:45 PM

## 2016-12-17 NOTE — Progress Notes (Signed)
Initial Nutrition Assessment  DOCUMENTATION CODES:   Non-severe (moderate) malnutrition in context of chronic illness  INTERVENTION:    Boost Plus TID, each supplement provides 360 kcals, 14 grams of protein  NUTRITION DIAGNOSIS:   Malnutrition (moderate) related to chronic illness (CHF, pulmonary fibrosis) as evidenced by moderate depletions of muscle mass, moderate depletion of body fat  GOAL:   Patient will meet greater than or equal to 90% of their needs  MONITOR:   PO intake, Supplement acceptance, Labs, Weight trends, Skin, I & O's  REASON FOR ASSESSMENT:   Consult Assessment of nutrition requirement/status  ASSESSMENT:   "83 y.o. Female with a past medical history significant for atrial fibrillation, pulmonary fibrosis, scoliosis and hyperlipidemia who presents with shortness of breath and abdominal pain. Patient reports that since last Friday she has had increase oxygen requirement form of 3-4 L home oxygen. She has increased her oxygen to 5L with minimal improvement in her symptoms. Patient also reports some abdominal pain and nausea but no vomiting. Patient also reports some sticky stools that have been black. Patient have been taking alka seltzer everyday for back pain secondary to scoliosis. Patient refuses to take oxycodone for her pain due GI discomfort. Patient denies any fever, chills, chest pain, dizziness, or vision changes." Copied from [facility] Medicine Note, Lovena Neighbours, MD, 12/16/16.  Pt reports "I don't eat much."  PO intake 80% (dinner 7/14) per flowsheet records. Doesn't like Ensure, however, does enjoy Boost supplements.  Endorses weight loss due to fluid. Unable to specify amount. Labs and medications reviewed. CBG's [phone].  Nutrition-Focused physical exam completed. Findings are moderate fat depletion, moderate muscle depletion, and no edema.   Diet Order:  Diet Heart Room service appropriate? Yes; Fluid consistency: Thin  Skin:  Wound  (see comment) (Stage 3 pressure injury to left buttocks, stage 2 pressure injury to sacrum.)  Last BM:  7/15  Height:   Ht Readings from Last 1 Encounters:  12/15/16 5\' 7"  (1.702 m)   Weight:   Wt Readings from Last 1 Encounters:  12/17/16 136 lb 7.4 oz (61.9 kg)   Ideal Body Weight:  61.3 kg  BMI:  Body mass index is 21.37 kg/m.  Estimated Nutritional Needs:   Kcal:  1600-1800  Protein:  80-90 gm  Fluid:  1.6-1.8 L  EDUCATION NEEDS:   No education needs identified at this time  Maureen Chatters, RD, LDN Pager #: [phone] After-Hours Pager #: [phone]

## 2016-12-17 NOTE — Progress Notes (Signed)
PT Cancellation Note  Patient Details Name: [name] MRN: [id number] DOB: 12/10/1931   Cancelled Treatment:     pt c/o MAX fatigue and freq trips to [facility] to void (Lasix) is "draining me".  Pt  Has been evaluated with rec for [facility] PT.  Will continue to monitor for mobility.    Felecia Shelling  PTA [facility]  Acute  Rehab Pager      [phone]

## 2016-12-17 NOTE — Progress Notes (Signed)
ANTICOAGULATION CONSULT NOTE   Pharmacy Consult for Warfarin Indication: atrial fibrillation  Allergies  Allergen Reactions  . Doxycycline Nausea Only  . Levaquin [Levofloxacin In D5w]     REACTION: leg weakness  . Amoxicillin     REACTION: upset stomach  . Lisinopril     cough  . Metoprolol     diarrhea   Height: 5\' 7"  (170.2 cm) Weight: 136 lb 7.4 oz (61.9 kg) IBW/kg (Calculated) : 61.6  Vital Signs: Temp: 97.7 F (36.5 C) (07/16 0423) Temp Source: Oral (07/16 0423) BP: 129/59 (07/16 0423) Pulse Rate: 59 (07/16 0856)  Labs:  Recent Labs  12/14/16 2033 12/15/16 0149 12/15/16 0503 12/15/16 0724 12/15/16 0730 12/15/16 1342 12/16/16 0426 12/17/16 0411  HGB 15.4*  --  14.5  --   --   --   --   --   HCT 45.9  --  44.2  --   --   --   --   --   PLT 229  --  224  --   --   --   --   --   LABPROT  --  24.2*  --   --   --   --  31.2* 30.5*  INR  --  2.13  --   --   --   --  2.93 2.84  CREATININE 1.45*  --   --  1.50*  --   --  1.43* 1.50*  TROPONINI 0.08* 0.11*  --   --  0.14* 0.13*  --   --    Estimated Creatinine Clearance: 28.1 mL/min (A) (by C-G formula based on SCr of 1.5 mg/dL (H)).  Medical History: Past Medical History:  Diagnosis Date  . Atrial fibrillation (HCC)    Paroxysmal  . Cystitis 1974  . DJD (degenerative joint disease)    Dr Kellie Simmering  . Hyperlipidemia 2005   NMR LIPOPROFILE: LDL 144 (1570/732), HDL 61, TG 103., LDL GOAL = < 130  . Idiopathic pulmonary fibrosis    IDIOPATHIC vs CHRONIC ERD  . Left bundle branch block   . Obstructive sleep apnea    CPAP Intolerant  . Thyroid disease    HYPOTHROIDISM   Medications:  Scheduled:  . aspirin EC  81 mg Oral Daily  . furosemide  40 mg Intravenous Q12H  . ipratropium-albuterol  3 mL Nebulization TID  . levothyroxine  50 mcg Oral QAC breakfast  . predniSONE  40 mg Oral Q breakfast  . sodium chloride flush  3 mL Intravenous Q12H  . Warfarin - Pharmacist Dosing Inpatient   Does not apply q1800    Assessment: 26 yoF admitted with SOB on chronic warfarin for A-fib. Pharmacy asked to assist with warfarin dosing inpatient. HD 5 mg Mon/Fri and 2.5 mg other days.  INR=2.13 on admission  Today, 12/17/16   INR 2.84, in therapeutic range  Hgb 14.5, plt 224 ( 7/14)   Goal of Therapy:  INR 2-3   Plan:   Warfarin 2.5 mg x1 at 1800  CBC in am  Daily PT/INR  Monitor CBC, s/s bleeding  Otho Bellows PharmD Pager [phone] 12/17/2016, 11:10 AM

## 2016-12-17 NOTE — Evaluation (Signed)
Occupational Therapy Evaluation Patient Details Name: [name] MRN: [id number] DOB: 1931-07-04 Today's Date: 12/17/2016    History of Present Illness [name] is a 83 y.o. female with medical history significant of hyperlipidemia, chronic respiratory failure on 2-4 liter oxygen at home, hypothyroidism, OSA not on CPAP, left bundle blockage, idiopathic pulmonary fibrosis, atrial fibrillation on Coumadin, sCHF with EF of 35%, who presents with shortness of breath   Clinical Impression   Pt admitted with shortness of breath. Pt currently with functional limitations due to the deficits listed below (see OT Problem List).  Pt will benefit from skilled OT to increase their safety and independence with ADL and functional mobility for ADL to facilitate discharge to venue listed below.      Follow Up Recommendations  No OT follow up    Equipment Recommendations  3 in 1 bedside commode    Recommendations for Other Services       Precautions / Restrictions Precautions Precautions: Fall Precaution Comments: O2      Mobility Bed Mobility Overal bed mobility: Needs Assistance Bed Mobility: Supine to Sit;Sit to Supine     Supine to sit: Min guard Sit to supine: Min guard      Transfers Overall transfer level: Needs assistance Equipment used: Rolling walker (2 wheeled) Transfers: Sit to/from Stand Sit to Stand: Min assist;Min guard         General transfer comment: assist to rise and stabilize    Balance Overall balance assessment: Needs assistance Sitting-balance support: No upper extremity supported;Feet supported Sitting balance-Leahy Scale: Fair     Standing balance support: Bilateral upper extremity supported Standing balance-Leahy Scale: Poor Standing balance comment: reliant on UEs                           ADL either performed or assessed with clinical judgement   ADL Overall ADL's : Needs assistance/impaired Eating/Feeding: Set  up;Sitting   Grooming: Set up;Sitting   Upper Body Bathing: Minimal assistance;Sitting   Lower Body Bathing: Minimal assistance;Sit to/from stand;Cueing for safety;Cueing for sequencing   Upper Body Dressing : Set up;Sitting   Lower Body Dressing: Minimal assistance;Sit to/from stand;Cueing for sequencing;Cueing for safety   Toilet Transfer: BSC;Supervision/safety   Toileting- Clothing Manipulation and Hygiene: Supervision/safety;Sit to/from stand         General ADL Comments: educated and handout provided regarding energy conservation     Vision Patient Visual Report: No change from baseline              Pertinent Vitals/Pain Pain Assessment: No/denies pain     Hand Dominance     Extremity/Trunk Assessment Upper Extremity Assessment Upper Extremity Assessment: Generalized weakness           Communication Communication Communication: No difficulties   Cognition Arousal/Alertness: Awake/alert Behavior During Therapy: WFL for tasks assessed/performed Overall Cognitive Status: Within Functional Limits for tasks assessed                                                Home Living Family/patient expects to be discharged to:: Private residence Living Arrangements: Spouse/significant other   Type of Home: House Home Access: Ramped entrance     Home Layout: One level     Bathroom Shower/Tub: Producer, television/film/video: Handicapped height     Home  Equipment: Dan Humphreys - 2 wheels;Cane - single point;Wheelchair - manual          Prior Functioning/Environment Level of Independence: Independent with assistive device(s)        Comments: mostly uses w/c, amb household distances        OT Problem List: Cardiopulmonary status limiting activity;Decreased strength      OT Treatment/Interventions: Self-care/ADL training;DME and/or AE instruction    OT Goals(Current goals can be found in the care plan section) Acute Rehab OT  Goals Patient Stated Goal: to get a little stronger OT Goal Formulation: With patient Time For Goal Achievement: 12/31/16 Potential to Achieve Goals: Good  OT Frequency: Min 2X/week   Barriers to D/C:               AM-PAC PT "6 Clicks" Daily Activity     Outcome Measure Help from another person eating meals?: None Help from another person taking care of personal grooming?: None Help from another person toileting, which includes using toliet, bedpan, or urinal?: A Little Help from another person bathing (including washing, rinsing, drying)?: A Little Help from another person to put on and taking off regular upper body clothing?: A Little Help from another person to put on and taking off regular lower body clothing?: A Little 6 Click Score: 20   End of Session Equipment Utilized During Treatment: Rolling walker Nurse Communication: Mobility status  Activity Tolerance: Patient limited by fatigue Patient left: in bed;with call bell/phone within reach;with family/visitor present  OT Visit Diagnosis: Muscle weakness (generalized) (M62.81)                Time: 1478-2956 OT Time Calculation (min): 18 min Charges:  OT General Charges $OT Visit: 1 Procedure OT Evaluation $OT Eval Moderate Complexity: 1 Procedure G-Codes:     Lise Auer, OT [phone]  Einar Crow D 12/17/2016, 2:29 PM

## 2016-12-17 NOTE — Care Management Note (Signed)
Case Management Note  Patient Details  Name: [name] MRN: [id number] Date of Birth: 03-18-1932  Subjective/Objective: 83 y/o f admitted w/Acute on chronic resp failure. From home w/spouse, has cane,rw,w/c,home 02-[facility]. CM referral for CHF screen-EF 35%,1 adm/79months-patient not appropriate for [facility] program.PT-recc HHPT-[facility] chosen for HHPT-rep Kim notified. Await HHPT order.                   Action/Plan:d/c plan home w/HHC.   Expected Discharge Date:                  Expected Discharge Plan:  Home w Home Health Services  In-House Referral:     Discharge planning Services  CM Consult  Post Acute Care Choice:  Durable Medical Equipment ([facility] home [phone]) Choice offered to:  Patient  DME Arranged:    DME Agency:     HH Arranged:  PT HH Agency:  [facility]  Status of Service:  In process, will continue to follow  If discussed at Long Length of Stay Meetings, dates discussed:    Additional Comments:  Lanier Clam, RN 12/17/2016, 4:29 PM

## 2016-12-18 LAB — BASIC METABOLIC PANEL
ANION GAP: 9 (ref 5–15)
Anion gap: 9 (ref 5–15)
BUN: 46 mg/dL — ABNORMAL HIGH (ref 6–20)
BUN: 48 mg/dL — ABNORMAL HIGH (ref 6–20)
CALCIUM: 8.8 mg/dL — AB (ref 8.9–10.3)
CALCIUM: 8.7 mg/dL — AB (ref 8.9–10.3)
CO2: 32 mmol/L (ref 22–32)
CO2: 31 mmol/L (ref 22–32)
CREATININE: 1.37 mg/dL — AB (ref 0.44–1.00)
CREATININE: 1.54 mg/dL — AB (ref 0.44–1.00)
Chloride: 97 mmol/L — ABNORMAL LOW (ref 101–111)
Chloride: 98 mmol/L — ABNORMAL LOW (ref 101–111)
GFR calc Af Amer: 40 mL/min — ABNORMAL LOW (ref >60)
GFR calc Af Amer: 35 mL/min — ABNORMAL LOW (ref >60)
GFR calc non Af Amer: 35 mL/min — ABNORMAL LOW (ref >60)
GFR, EST NON AFRICAN AMERICAN: 30 mL/min — AB (ref >60)
GLUCOSE: 120 mg/dL — AB (ref 65–99)
Glucose, Bld: 149 mg/dL — ABNORMAL HIGH (ref 65–99)
Potassium: 3.9 mmol/L (ref 3.5–5.1)
Potassium: 4.1 mmol/L (ref 3.5–5.1)
SODIUM: 138 mmol/L (ref 135–145)
Sodium: 138 mmol/L (ref 135–145)

## 2016-12-18 LAB — PROTIME-INR
INR: 2.95
Prothrombin Time: 31.3 seconds — ABNORMAL HIGH (ref 11.4–15.2)

## 2016-12-18 LAB — HEMOGLOBIN A1C
Hgb A1c MFr Bld: 5.4 % (ref 4.8–5.6)
MEAN PLASMA GLUCOSE: 108 mg/dL

## 2016-12-18 LAB — MAGNESIUM: MAGNESIUM: 2.2 mg/dL (ref 1.7–2.4)

## 2016-12-18 MED ORDER — FUROSEMIDE 40 MG PO TABS
40.0000 mg | ORAL_TABLET | Freq: Every day | ORAL | Status: DC
  Administered 2016-12-18 – 2016-12-19 (×2): 40 mg via ORAL
  Filled 2016-12-18 (×2): qty 1

## 2016-12-18 MED ORDER — WARFARIN SODIUM 1 MG PO TABS
1.0000 mg | ORAL_TABLET | Freq: Once | ORAL | Status: AC
  Administered 2016-12-18: 1 mg via ORAL
  Filled 2016-12-18: qty 1

## 2016-12-18 NOTE — Progress Notes (Signed)
CCMD reported pt. Had 12 beat run of Vtach. Pt. Has no c/o SOB, asymptomatic, comfortable in bed. On call MD [facility] paged and made aware. New orders of STAT BMET and Mag labs verbally ordered. Will continue to monitor pt. Closely.

## 2016-12-18 NOTE — Progress Notes (Signed)
Occupational Therapy Treatment Patient Details Name: [name] MRN: [id number] DOB: 22-Oct-1931 Today's Date: 12/18/2016    History of present illness [name] is a 83 y.o. female with medical history significant of hyperlipidemia, chronic respiratory failure on 2-4 liter oxygen at home, hypothyroidism, OSA not on CPAP, left bundle blockage, idiopathic pulmonary fibrosis, atrial fibrillation on Coumadin, sCHF with EF of 35%, who presents with shortness of breath   OT comments  Pt fatigued quickly this day but able to verbalize understanding of energy conservation   Follow Up Recommendations  No OT follow up    Equipment Recommendations  3 in 1 bedside commode    Recommendations for Other Services      Precautions / Restrictions Precautions Precautions: Fall Precaution Comments: O2       Mobility Bed Mobility Overal bed mobility: Needs Assistance Bed Mobility: Supine to Sit;Sit to Supine     Supine to sit: Min guard Sit to supine: Min guard      Transfers Overall transfer level: Needs assistance Equipment used: Rolling walker (2 wheeled) Transfers: Sit to/from Stand Sit to Stand: Min assist;Min guard         General transfer comment: assist to rise and stabilize    Balance Overall balance assessment: Needs assistance Sitting-balance support: No upper extremity supported;Feet supported Sitting balance-Leahy Scale: Fair     Standing balance support: Bilateral upper extremity supported Standing balance-Leahy Scale: Poor Standing balance comment: reliant on UEs                           ADL either performed or assessed with clinical judgement   ADL Overall ADL's : Needs assistance/impaired     Grooming: Standing;Min guard           Upper Body Dressing : Set up;Sitting   Lower Body Dressing: Minimal assistance;Sit to/from stand;Cueing for safety;Cueing for sequencing   Toilet Transfer: BSC;Supervision/safety;Comfort height  toilet   Toileting- Clothing Manipulation and Hygiene: Supervision/safety;Sit to/from stand       Functional mobility during ADLs: Minimal assistance General ADL Comments: educated and handout provided regarding energy conservation. Pt able to verbalize ways to use energy conservation strategies.  Pt did fatigue quickly this day .       Vision Patient Visual Report: No change from baseline            Cognition Arousal/Alertness: Awake/alert Behavior During Therapy: WFL for tasks assessed/performed Overall Cognitive Status: Within Functional Limits for tasks assessed                                                General Comments      Pertinent Vitals/ Pain       Pain Assessment: No/denies pain  Home Living                                              Frequency  Min 2X/week        Progress Toward Goals  OT Goals(current goals can now be found in the care plan section)  Progress towards OT goals: Progressing toward goals     Plan Discharge plan remains appropriate       AM-PAC PT "6 Clicks"  Daily Activity     Outcome Measure   Help from another person eating meals?: None Help from another person taking care of personal grooming?: None Help from another person toileting, which includes using toliet, bedpan, or urinal?: A Little Help from another person bathing (including washing, rinsing, drying)?: A Little Help from another person to put on and taking off regular upper body clothing?: A Little Help from another person to put on and taking off regular lower body clothing?: A Little 6 Click Score: 20    End of Session Equipment Utilized During Treatment: Rolling walker  OT Visit Diagnosis: Muscle weakness (generalized) (M62.81)   Activity Tolerance Patient limited by fatigue   Patient Left in bed;with call bell/phone within reach;with family/visitor present   Nurse Communication Mobility status        Time:  1119-1140 OT Time Calculation (min): 21 min  Charges: OT General Charges $OT Visit: 1 Procedure OT Treatments $Self Care/Home Management : 8-22 mins  [place], [place] [phone]   [name] 12/18/2016, 12:56 PM

## 2016-12-18 NOTE — Progress Notes (Signed)
Pt's PCP Dr. Cheryll Cockayne at [facility]

## 2016-12-18 NOTE — Progress Notes (Signed)
Chaplain received referral on Spiritual care voicemail.  Family member asking for information about advance directive  Chaplain delivered advance directive packet to room.  Pt alert and oriented.  Provided education around advance directive.  Pt will share packet with family and make decisions about whether they want to complete a new advance directive.    Chaplain follow up for support as needed.     [facility] Chaplain Burnis Kingfisher, MDiv

## 2016-12-18 NOTE — Progress Notes (Signed)
PROGRESS NOTE                                                                                                                                                                                                             Patient Demographics:    [name], is a 83 y.o. female, DOB - [birth year], ZLD:[id number]  Admit date - 12/14/2016   Admitting Physician Lorretta Harp, MD  Outpatient Primary MD for the patient is Pincus Sanes, MD  LOS - 3  Outpatient Specialists: Dr [name]  Chief Complaint  Patient presents with  . Shortness of Breath       Brief Narrative   83 year old female with chronic respiratory failure secondary to idiopathic pulmonary fibrosis on 3-4 L home oxygen, OSA not on CPAP, A. fib on Coumadin, systolic CHF with EF of 35%, hypothyroidism, hyperlipidemia who presented to the ED with about 5 days of progressive shortness of breath, requiring more oxygen and associated with nonproductive cough. Reports having chronic leg edema which has worsened over the same duration.. Patient reports that she had about his for almost 12 hours few days back and the heat may have aggravated her symptoms. No fevers or chills, no orthopnea or PND, no abdominal pain, dizziness, palpitations, nausea, vomiting, bowel or urinary symptoms or weakness. Denies missing her medications, recent travel or illness. In the ED she was found to be in hypoxic respiratory failure requiring 5 L via nasal cannula, chest x-ray unremarkable. BNP of 2000, WBC of 12 K and mildly worsened renal function. Troponin of 0.08. Admitted to medical floor for acute on chronic hypoxic respiratory failure secondary to CHF exacerbation and possible exacerbation of her pulmonary fibrosis.    Subjective:   Is extremely worn out having difficulty getting out of bed. He was also hypoxic this morning at the oxygen was off.  Assessment  & Plan :    Principal Problem:  Acute on chronic respiratory failure with hypoxia (HCC) Combination of right heart failure and worsening of pulmonary fibrosis. 2-D echo with improved LVEF of 55-60% but shows moderate to severely reduced RV systolic function and increased PA pressure of 59 mmHg. -Has diuresed quite well on IV Lasix and wait down almost 7-8 kg since admission. Will transition to oral Lasix 40 mg once daily. Also transitioned to oral prednisone with taper upon discharge. (Should  be tapered down to 10 mg daily which is her home dose). -Continue oxygen (maintain sats on 4 L via nasal cannula)    Active Problems:   Acute on chronic systolic (congestive) heart failure (HCC) Worsened right-sided heart failure with pulmonary artery hypertension. Improved LVEF on echo. Diuresed with IV Lasix (- 8.2 L since admission). Transition to oral Lasix.  Idiopathic pulmonary fibrosis Acute worsening.  Continue oral prednisone and scheduled nebs. Supportive care with antitussives. Discussed with her pulmonologist Dr. Vassie Loll the who agree with current treatment and recommends outpatient follow-up with him or his NP in 2 weeks.  Elevated troponin Suspect demand ischemia with acute CHF. Troponin peaked at 0.14. No chest pain symptoms.  echo reviewed.    Hypothyroidism Continue Synthroid.    Atrial fibrillation (HCC) Rate controlled. On Coumadin with therapeutic INR.     Acute on CKD (chronic kidney disease), stage III Mildly worsened on IV Lasix, continue current dose.  Pressure injury to left buttock and sacrum Stage III pressure like and stage II pressure injury to sacrum. Wound care consult appreciated.  Generalized weakness PT recommends home health.  Moderate protein calorie malnutrition Added supplement.  Code Status : DO NOT RESUSCITATE  Family Communication  : None at bedside. Discussed with husband and daughter at bedside during hospital stay.  Disposition Plan  : Patient is extremely deconditioned and weak  to go home today. Home tomorrow if symptoms further improved.  Barriers For Discharge : Active symptoms  Consults  :  None  Procedures  : 2-D echo  DVT Prophylaxis  : Coumadin    Lab Results  Component Value Date   PLT 224 12/15/2016    Antibiotics  :    Anti-infectives    None        Objective:   Vitals:   12/17/16 2130 12/17/16 2214 12/18/16 0430 12/18/16 0827  BP:  (!) 113/59 (!) 116/42   Pulse:  70 (!) 53   Resp:  20 20   Temp:  97.8 F (36.6 C) 97.7 F (36.5 C)   TempSrc:  Oral Oral   SpO2: 96% 99% 98% (!) 86%  Weight:   58.1 kg (128 lb 1.4 oz)   Height:        Wt Readings from Last 3 Encounters:  12/18/16 58.1 kg (128 lb 1.4 oz)  09/11/16 61.7 kg (136 lb)  06/19/16 61.2 kg (135 lb)     Intake/Output Summary (Last 24 hours) at 12/18/16 1134 Last data filed at 12/18/16 1000  Gross per 24 hour  Intake              720 ml  Output             2150 ml  Net            -1430 ml     Physical Exam Gen.: Elderly female lying in bed appears negative fatigued HEENT: Moist mucosa, supple neck Chest: Coarse crackles bilaterally (L >R), scattered rhonchi over right lung GI: Soft, nondistended, nontender Musculoskeletal CNS: Alert and oriented: Warm, improved leg edema bilaterally, (bruising over left foot with some swelling, non-tender)         Data Review:    CBC  Recent Labs Lab 12/14/16 2033 12/15/16 0503  WBC 12.2* 8.2  HGB 15.4* 14.5  HCT 45.9 44.2  PLT 229 224  MCV 98.7 101.1*  MCH 33.1 33.2  MCHC 33.6 32.8  RDW 15.1 15.2  LYMPHSABS 3.2  --   MONOABS 0.9  --  EOSABS 0.1  --   BASOSABS 0.1  --     Chemistries   Recent Labs Lab 12/14/16 2033 12/15/16 0724 12/16/16 0426 12/17/16 0411 12/18/16 0420  NA 143 143 139 138 138  K 3.9 3.9 4.0 4.1 3.9  CL 104 105 102 100* 97*  CO2 27 24 27 31  32  GLUCOSE 152* 166* 126* 138* 120*  BUN 20 21* 27* 37* 46*  CREATININE 1.45* 1.50* 1.43* 1.50* 1.37*  CALCIUM 8.7* 8.6* 8.4* 8.8* 8.8*   MG  --  2.3  --   --   --   AST 36  --   --   --   --   ALT 19  --   --   --   --   ALKPHOS 54  --   --   --   --   BILITOT 1.0  --   --   --   --    ------------------------------------------------------------------------------------------------------------------ No results for input(s): CHOL, HDL, LDLCALC, TRIG, CHOLHDL, LDLDIRECT in the last 72 hours.  Lab Results  Component Value Date   HGBA1C 5.4 12/15/2016   ------------------------------------------------------------------------------------------------------------------ No results for input(s): TSH, T4TOTAL, T3FREE, THYROIDAB in the last 72 hours.  Invalid input(s): FREET3 ------------------------------------------------------------------------------------------------------------------ No results for input(s): VITAMINB12, FOLATE, FERRITIN, TIBC, IRON, RETICCTPCT in the last 72 hours.  Coagulation profile  Recent Labs Lab 12/15/16 0149 12/16/16 0426 12/17/16 0411 12/18/16 0420  INR 2.13 2.93 2.84 2.95    No results for input(s): DDIMER in the last 72 hours.  Cardiac Enzymes  Recent Labs Lab 12/15/16 0149 12/15/16 0730 12/15/16 1342  TROPONINI 0.11* 0.14* 0.13*   ------------------------------------------------------------------------------------------------------------------    Component Value Date/Time   BNP 1,958.3 (H) 12/14/2016 2033   BNP 229.4 (H) 08/29/2015 1636    Inpatient Medications  Scheduled Meds: . aspirin EC  81 mg Oral Daily  . furosemide  40 mg Oral Daily  . ipratropium-albuterol  3 mL Nebulization TID  . lactose free nutrition  237 mL Oral TID WC  . levothyroxine  50 mcg Oral QAC breakfast  . predniSONE  40 mg Oral Q breakfast  . sodium chloride flush  3 mL Intravenous Q12H  . Warfarin - Pharmacist Dosing Inpatient   Does not apply q1800   Continuous Infusions: . sodium chloride     PRN Meds:.sodium chloride, acetaminophen, albuterol, ALPRAZolam, dextromethorphan-guaiFENesin,  hydrALAZINE, nitroGLYCERIN, nystatin, ondansetron (ZOFRAN) IV, sodium chloride flush, zolpidem  Micro Results No results found for this or any previous visit (from the past 240 hour(s)).  Radiology Reports Dg Chest Port 1 View  Result Date: 12/14/2016 CLINICAL DATA:  Shortness of breath. EXAM: PORTABLE CHEST 1 VIEW COMPARISON:  Radiographs 09/11/2016.  CT 12/04/2012 FINDINGS: Stable cardiomegaly and mediastinal contours. Volume loss in the left hemithorax which appears similar to prior exam. Interstitial lung disease with peripheral fibrosis, unchanged in degree from prior exam. Chronic blunting of left costophrenic angle. No pneumothorax or focal airspace disease. IMPRESSION: 1. Chronic interstitial lung disease, grossly unchanged from prior exam. Pattern consistent with UIP on prior chest CT. 2. Stable cardiomegaly allowing for differences in technique. Chronic left lung volume loss. No evident acute abnormality. Electronically Signed   By: Rubye Oaks M.D.   On: 12/14/2016 20:57    Time Spent in minutes 25   Eddie North M.D on 12/18/2016 at 11:34 AM  Between 7am to 7pm - Pager - [phone]  After 7pm go to [email or web address] - password [facility]  [facility] -  Office  [phone]

## 2016-12-18 NOTE — Progress Notes (Signed)
ANTICOAGULATION CONSULT NOTE   Pharmacy Consult for Warfarin Indication: atrial fibrillation  Allergies  Allergen Reactions  . Doxycycline Nausea Only  . Levaquin [Levofloxacin In D5w]     REACTION: leg weakness  . Amoxicillin     REACTION: upset stomach  . Lisinopril     cough  . Metoprolol     diarrhea   Height: 5\' 7"  (170.2 cm) Weight: 128 lb 1.4 oz (58.1 kg) IBW/kg (Calculated) : 61.6  Vital Signs: Temp: 97.7 F (36.5 C) (07/17 0430) Temp Source: Oral (07/17 0430) BP: 116/42 (07/17 0430) Pulse Rate: 53 (07/17 0430)  Labs:  Recent Labs  12/15/16 1342 12/16/16 0426 12/17/16 0411 12/18/16 0420  LABPROT  --  31.2* 30.5* 31.3*  INR  --  2.93 2.84 2.95  CREATININE  --  1.43* 1.50* 1.37*  TROPONINI 0.13*  --   --   --    Estimated Creatinine Clearance: 29 mL/min (A) (by C-G formula based on SCr of 1.37 mg/dL (H)).  Medical History: Past Medical History:  Diagnosis Date  . Atrial fibrillation (HCC)    Paroxysmal  . Cystitis 1974  . DJD (degenerative joint disease)    Dr Kellie Simmering  . Hyperlipidemia 2005   NMR LIPOPROFILE: LDL 144 (1570/732), HDL 61, TG 103., LDL GOAL = < 130  . Idiopathic pulmonary fibrosis    IDIOPATHIC vs CHRONIC ERD  . Left bundle branch block   . Obstructive sleep apnea    CPAP Intolerant  . Thyroid disease    HYPOTHROIDISM   Medications:  Scheduled:  . aspirin EC  81 mg Oral Daily  . furosemide  40 mg Oral Daily  . ipratropium-albuterol  3 mL Nebulization TID  . lactose free nutrition  237 mL Oral TID WC  . levothyroxine  50 mcg Oral QAC breakfast  . predniSONE  40 mg Oral Q breakfast  . sodium chloride flush  3 mL Intravenous Q12H  . Warfarin - Pharmacist Dosing Inpatient   Does not apply q1800   Assessment: 68 yoF admitted with SOB on chronic warfarin for A-fib. Pharmacy asked to assist with warfarin dosing inpatient. HD 5 mg Mon/Fri and 2.5 mg other days.  INR=2.13 on admission  Today, 12/18/16   INR 2.95, in therapeutic  range  Hgb 14.5, plt 224 ( 7/14)   Goal of Therapy:  INR 2-3   Plan:   Warfarin 1mg  today  CBC in am  Daily PT/INR  Monitor CBC, s/s bleeding  Otho Bellows PharmD Pager [phone] 12/18/2016, 11:47 AM

## 2016-12-19 DIAGNOSIS — J9621 Acute and chronic respiratory failure with hypoxia: Secondary | ICD-10-CM

## 2016-12-19 LAB — BASIC METABOLIC PANEL
Anion gap: 7 (ref 5–15)
BUN: 52 mg/dL — AB (ref 6–20)
CHLORIDE: 99 mmol/L — AB (ref 101–111)
CO2: 33 mmol/L — ABNORMAL HIGH (ref 22–32)
CREATININE: 1.39 mg/dL — AB (ref 0.44–1.00)
Calcium: 8.9 mg/dL (ref 8.9–10.3)
GFR calc non Af Amer: 34 mL/min — ABNORMAL LOW (ref >60)
GFR, EST AFRICAN AMERICAN: 40 mL/min — AB (ref >60)
Glucose, Bld: 109 mg/dL — ABNORMAL HIGH (ref 65–99)
POTASSIUM: 4.1 mmol/L (ref 3.5–5.1)
SODIUM: 139 mmol/L (ref 135–145)

## 2016-12-19 LAB — CBC
HEMATOCRIT: 45.8 % (ref 36.0–46.0)
HEMOGLOBIN: 14.9 g/dL (ref 12.0–15.0)
MCH: 32.7 pg (ref 26.0–34.0)
MCHC: 32.5 g/dL (ref 30.0–36.0)
MCV: 100.4 fL — ABNORMAL HIGH (ref 78.0–100.0)
Platelets: 220 10*3/uL (ref 150–400)
RBC: 4.56 MIL/uL (ref 3.87–5.11)
RDW: 14.7 % (ref 11.5–15.5)
WBC: 12.6 10*3/uL — ABNORMAL HIGH (ref 4.0–10.5)

## 2016-12-19 LAB — PROTIME-INR
INR: 2.69
PROTHROMBIN TIME: 29.1 s — AB (ref 11.4–15.2)

## 2016-12-19 MED ORDER — PREDNISONE 10 MG PO TABS: ORAL_TABLET | ORAL | 0 refills | Status: DC

## 2016-12-19 MED ORDER — WARFARIN SODIUM 2.5 MG PO TABS: 2.5000 mg | ORAL_TABLET | Freq: Once | ORAL | Status: DC

## 2016-12-19 MED ORDER — FUROSEMIDE 20 MG PO TABS: 40.0000 mg | ORAL_TABLET | Freq: Every day | ORAL | 0 refills | Status: DC

## 2016-12-19 NOTE — Discharge Summary (Signed)
Physician Discharge Summary  [name] ZOX:[id number] DOB: Nov 30, 1931 DOA: 12/14/2016  PCP: Pincus Sanes, MD  Admit date: 12/14/2016 Discharge date: 12/19/2016  Admitted From: Home Disposition:  SNF   Recommendations for Outpatient Follow-up:  1. Follow up with PCP in 1 week 2. Follow up with Pulmnology Dr. Vassie Loll in 1-2 weeks  3. Please obtain BMP/CBC in 1 week to ensure stability kidney function, resolution of leukocytosis.  4. Follow up with coumadin clinic, follow INR   Discharge Condition: Stable CODE STATUS: DNR  Diet recommendation: Heart healthy  Wound care: Cleanse wounds to buttocks and sacrum with soap and water and pat dry. Apply silicone border foam dressing. Encourage to turn and reposition every two hours.  Elevate legs when possible.   Brief/Interim Summary: From H&P by Dr. Clyde Lundborg: [name] is a 83 y.o. female with medical history significant of hyperlipidemia, chronic respiratory failure on 2-4 liter oxygen at home, hypothyroidism, OSA not on CPAP, left bundle blockage, idiopathic pulmonary fibrosis, atrial fibrillation on Coumadin, sCHF with EF of 35%, who presents with shortness of breath. Patient states that she has been having shortness of breath in the past 3 days, which has been progressively getting worse. She requires more oxygen. She speaks in full sentence. She has mild dry cough, but no fever or chills. Denies chest pain. Patient does not have nausea, vomiting, diarrhea, abdominal pain, symptoms of UTI or unilateral weakness.   ED Course: Pt was found to have positive troponin 0.08, BNP 1958.3, WBC 12.2, slightly worsening renal function, negative FOBT, temperature normal, no tachycardia, tachypnea, oxygen saturation 91-95% on 5 L nasal cannula oxygen ED. Chest x-ray is negative for acute abnormalities. Patient is admitted to telemetry bed as inpatient. Admitted to medical floor for acute on chronic hypoxic respiratory failure secondary to CHF exacerbation  and possible exacerbation of her pulmonary fibrosis.   Acute on chronic respiratory failure with hypoxia  -Combination of right heart failure and worsening of pulmonary fibrosis.  -2-D echo with improved LVEF of 55-60% but shows moderate to severely reduced RV systolic function and increased PA pressure of 59 mmHg. -Has diuresed quite well on IV Lasix and wait down 7kg and -9L since admission. Transition to oral Lasix 40 mg once daily. Also transitioned to oral prednisone with taper upon discharge. (Should be tapered down to 10 mg daily which is her home dose). -Continue oxygen (maintain sats on 4 L via nasal cannula). Back on home levels.   Acute on chronic systolic (congestive) heart failure  -Worsened right-sided heart failure with pulmonary artery hypertension. Improved LVEF on echo. Diuresed with IV Lasix (-9 L since admission). Transition to oral Lasix.  Idiopathic pulmonary fibrosis -Acute worsening.  Continue oral prednisone and scheduled nebs. Supportive care with antitussives. Dr. Gonzella Lex discussed with her pulmonologist Dr. Vassie Loll the who agree with current treatment and recommends outpatient follow-up with him or his NP in 2 weeks.  Elevated troponin -Suspect demand ischemia with acute CHF. Troponin peaked at 0.14. No chest pain symptoms.   Hypothyroidism -Continue Synthroid.  Atrial fibrillation -Rate controlled. On Coumadin with therapeutic INR.  AKI on CKD (chronic kidney disease), stage III -Mildly worsened on IV Lasix, continue current dose. Now Cr improving.   Pressure injury to left buttock and sacrum -Stage III pressure like and stage II pressure injury to sacrum. Wound care consult appreciated.  Generalized weakness -PT recommends SNF   Moderate protein calorie malnutrition -Protein supplement    Discharge Instructions  Discharge Instructions    (  HEART FAILURE PATIENTS) Call MD:  Anytime you have any of the following symptoms: 1) 3 pound weight gain  in 24 hours or 5 pounds in 1 week 2) shortness of breath, with or without a dry hacking cough 3) swelling in the hands, feet or stomach 4) if you have to sleep on extra pillows at night in order to breathe.    Complete by:  As directed    Call MD for:  difficulty breathing, headache or visual disturbances    Complete by:  As directed    Call MD for:  extreme fatigue    Complete by:  As directed    Call MD for:  hives    Complete by:  As directed    Call MD for:  persistant dizziness or light-headedness    Complete by:  As directed    Call MD for:  persistant nausea and vomiting    Complete by:  As directed    Call MD for:  severe uncontrolled pain    Complete by:  As directed    Call MD for:  temperature >100.4    Complete by:  As directed    Diet - low sodium heart healthy    Complete by:  As directed    Discharge instructions    Complete by:  As directed    You were cared for by a hospitalist during your hospital stay. If you have any questions about your discharge medications or the care you received while you were in the hospital after you are discharged, you can call the unit and asked to speak with the hospitalist on call if the hospitalist that took care of you is not available. Once you are discharged, your primary care physician will handle any further medical issues. Please note that NO REFILLS for any discharge medications will be authorized once you are discharged, as it is imperative that you return to your primary care physician (or establish a relationship with a primary care physician if you do not have one) for your aftercare needs so that they can reassess your need for medications and monitor your lab values.   Discharge wound care:    Complete by:  As directed    Cleanse wounds to buttocks and sacrum with soap and water and pat dry. Apply silicone border foam dressing. Encourage to turn and reposition every two hours.  Elevate legs when possible.   Increase activity slowly     Complete by:  As directed      Allergies as of 12/19/2016      Reactions   Doxycycline Nausea Only   Levaquin [levofloxacin In D5w]    REACTION: leg weakness   Amoxicillin    REACTION: upset stomach   Lisinopril    cough   Metoprolol    diarrhea      Medication List    STOP taking these medications   aspirin-sod bicarb-citric acid 325 MG Tbef tablet Commonly known as:  ALKA-SELTZER   clotrimazole-betamethasone cream Commonly known as:  LOTRISONE     TAKE these medications   acetaminophen 325 MG tablet Commonly known as:  TYLENOL Take 325 mg by mouth every 6 (six) hours as needed for pain.   albuterol 108 (90 Base) MCG/ACT inhaler Commonly known as:  PROVENTIL HFA;VENTOLIN HFA Inhale 2 puffs into the lungs every 6 (six) hours as needed for wheezing or shortness of breath.   furosemide 20 MG tablet Commonly known as:  LASIX Take 2 tablets (40 mg total)  by mouth daily. What changed:  how much to take  how to take this  when to take this  additional instructions   levothyroxine 50 MCG tablet Commonly known as:  SYNTHROID, LEVOTHROID Take 1 tablet (50 mcg total) by mouth daily before breakfast. -- Office visit needed for further refills What changed:  Another medication with the same name was removed. Continue taking this medication, and follow the directions you see here.   nystatin powder Generic drug:  nystatin Apply twice a day as needed What changed:  how much to take  how to take this  when to take this  reasons to take this  additional instructions   predniSONE 10 MG tablet Commonly known as:  DELTASONE Take 4 tabs for 3 days, then 3 tabs for 3 days, then 2 tabs for 3 days, then 1 tab daily What changed:  how much to take  how to take this  when to take this  additional instructions  Another medication with the same name was removed. Continue taking this medication, and follow the directions you see here.   warfarin 5 MG  tablet Commonly known as:  COUMADIN TAKE ONE TABLET BY MOUTH AS DIRECTED BY  ANTICOAGULATION CLINIC What changed:  See the new instructions.      Follow-up Information    Pincus Sanes, MD. Schedule an appointment as soon as possible for a visit in 1 week(s).   Specialty:  Internal Medicine Contact information: [address] [place] [address] [phone]        Oretha Milch, MD. Schedule an appointment as soon as possible for a visit in 2 week(s).   Specialty:  Pulmonary Disease Why:  Follow up for Idiopathic pulmonary fibrosis Contact information: [address] [place] [address] [phone]          Allergies  Allergen Reactions  . Doxycycline Nausea Only  . Levaquin [Levofloxacin In D5w]     REACTION: leg weakness  . Amoxicillin     REACTION: upset stomach  . Lisinopril     cough  . Metoprolol     diarrhea    Consultations:  None    Procedures/Studies: Dg Chest Port 1 View  Result Date: 12/14/2016 CLINICAL DATA:  Shortness of breath. EXAM: PORTABLE CHEST 1 VIEW COMPARISON:  Radiographs 09/11/2016.  CT 12/04/2012 FINDINGS: Stable cardiomegaly and mediastinal contours. Volume loss in the left hemithorax which appears similar to prior exam. Interstitial lung disease with peripheral fibrosis, unchanged in degree from prior exam. Chronic blunting of left costophrenic angle. No pneumothorax or focal airspace disease. IMPRESSION: 1. Chronic interstitial lung disease, grossly unchanged from prior exam. Pattern consistent with UIP on prior chest CT. 2. Stable cardiomegaly allowing for differences in technique. Chronic left lung volume loss. No evident acute abnormality. Electronically Signed   By: Rubye Oaks M.D.   On: 12/14/2016 20:57    Echo 7/14  Study Conclusions  - Left ventricle: The cavity size was normal. There was moderate   focal basal hypertrophy of the septum. Systolic function was   normal. The estimated ejection fraction was in the  range of 50%   to 55%. Wall motion was normal; there were no regional wall   motion abnormalities. Doppler parameters are consistent with   abnormal left ventricular relaxation (grade 1 diastolic   dysfunction). - Ventricular septum: Septal motion showed abnormal function and   dyssynergy (LBBB). - Aortic valve: There was moderate regurgitation. - Mitral valve: There was mild regurgitation. -  Right ventricle: The cavity size was moderately dilated. Wall   thickness was normal. Systolic function was moderately reduced. - Right atrium: The atrium was severely dilated. - Tricuspid valve: There was mild regurgitation. - Pulmonary arteries: Systolic pressure was moderately increased.   PA peak pressure: 59 mm Hg (S).   Discharge Exam: Vitals:   12/19/16 0625 12/19/16 0916  BP: (!) 128/53   Pulse: (!) 57 65  Resp: 18 18  Temp: 97.8 F (36.6 C)    Vitals:   12/18/16 2131 12/18/16 2255 12/19/16 0625 12/19/16 0916  BP: (!) 129/51  (!) 128/53   Pulse: 67  (!) 57 65  Resp: 18  18 18   Temp: 97.9 F (36.6 C)  97.8 F (36.6 C)   TempSrc: Oral  Oral   SpO2: 97% 97% 99% 98%  Weight:   59.4 kg (130 lb 15.3 oz)   Height:        General: Pt is alert, awake, not in acute distress, fatigued  Cardiovascular: S1/S2 +, no rubs, no gallops Respiratory: course crackles bilaterally, no wheezing, no rhonchi, on [place] O2  Abdominal: Soft, NT, ND, bowel sounds + Extremities: no edema, no cyanosis    The results of significant diagnostics from this hospitalization (including imaging, microbiology, ancillary and laboratory) are listed below for reference.     Microbiology: No results found for this or any previous visit (from the past 240 hour(s)).   Labs: BNP (last 3 results)  Recent Labs  12/14/16 2033  BNP 1,958.3*   Basic Metabolic Panel:  Recent Labs Lab 12/15/16 0724 12/16/16 0426 12/17/16 0411 12/18/16 0420 12/18/16 2121 12/19/16 0401  NA 143 139 138 138 138 139  K 3.9  4.0 4.1 3.9 4.1 4.1  CL 105 102 100* 97* 98* 99*  CO2 24 27 31  32 31 33*  GLUCOSE 166* 126* 138* 120* 149* 109*  BUN 21* 27* 37* 46* 48* 52*  CREATININE 1.50* 1.43* 1.50* 1.37* 1.54* 1.39*  CALCIUM 8.6* 8.4* 8.8* 8.8* 8.7* 8.9  MG 2.3  --   --   --  2.2  --    Liver Function Tests:  Recent Labs Lab 12/14/16 2033  AST 36  ALT 19  ALKPHOS 54  BILITOT 1.0  PROT 6.8  ALBUMIN 3.2*   No results for input(s): LIPASE, AMYLASE in the last 168 hours. No results for input(s): AMMONIA in the last 168 hours. CBC:  Recent Labs Lab 12/14/16 2033 12/15/16 0503 12/19/16 0401  WBC 12.2* 8.2 12.6*  NEUTROABS 8.0*  --   --   HGB 15.4* 14.5 14.9  HCT 45.9 44.2 45.8  MCV 98.7 101.1* 100.4*  PLT 229 224 220   Cardiac Enzymes:  Recent Labs Lab 12/14/16 2033 12/15/16 0149 12/15/16 0730 12/15/16 1342  TROPONINI 0.08* 0.11* 0.14* 0.13*   BNP: Invalid input(s): POCBNP CBG: No results for input(s): GLUCAP in the last 168 hours. D-Dimer No results for input(s): DDIMER in the last 72 hours. Hgb A1c No results for input(s): HGBA1C in the last 72 hours. Lipid Profile No results for input(s): CHOL, HDL, LDLCALC, TRIG, CHOLHDL, LDLDIRECT in the last 72 hours. Thyroid function studies No results for input(s): TSH, T4TOTAL, T3FREE, THYROIDAB in the last 72 hours.  Invalid input(s): FREET3 Anemia work up No results for input(s): VITAMINB12, FOLATE, FERRITIN, TIBC, IRON, RETICCTPCT in the last 72 hours. Urinalysis    Component Value Date/Time   COLORURINE Yellow 01/24/2009 0926   APPEARANCEUR CLEAR 01/24/2009 0926   LABSPEC 1.010 01/24/2009  0926   PHURINE 7.0 01/24/2009 0926   GLUCOSEU NEGATIVE 01/24/2009 0926   BILIRUBINUR NEGATIVE 01/24/2009 0926   KETONESUR NEGATIVE 01/24/2009 0926   UROBILINOGEN 0.2 01/24/2009 0926   NITRITE NEGATIVE 01/24/2009 0926   LEUKOCYTESUR TRACE 01/24/2009 0926   Sepsis Labs Invalid input(s): PROCALCITONIN,  WBC,  LACTICIDVEN Microbiology No  results found for this or any previous visit (from the past 240 hour(s)).   Time coordinating discharge: 45 minutes  SIGNED:  Noralee Stain, DO [facility] Pager [phone]  If 7PM-7AM, please contact night-coverage [email or web address] Password [facility] 12/19/2016, 11:56 AM

## 2016-12-19 NOTE — Progress Notes (Signed)
Report given to [place] at [facility].

## 2016-12-19 NOTE — Care Management Important Message (Signed)
Important Message  Patient Details  Name: [name] MRN: [id number] Date of Birth: Mar 14, 1932   Medicare Important Message Given:  Yes    Caren Macadam 12/19/2016, 11:26 AMImportant Message  Patient Details  Name: [name] MRN: [id number] Date of Birth: July 07, 1931   Medicare Important Message Given:  Yes    Caren Macadam 12/19/2016, 11:26 AM

## 2016-12-19 NOTE — Clinical Social Work Note (Signed)
Clinical Social Work Assessment  Patient Details  Name: [name] MRN: [id number] Date of Birth: [birth year]  Date of referral:  12/19/16               Reason for consult:  Facility Placement                Permission sought to share information with:  Facility Medical sales representative, Family Supports Permission granted to share information::  Yes, Verbal Permission Granted  Name::     [employer]::     Relationship::  Daughter  Contact Information:  [phone]  Housing/Transportation Living arrangements for the past 2 months:  Single Family Home Source of Information:  Patient Patient Interpreter Needed:  None Criminal Activity/Legal Involvement Pertinent to Current Situation/Hospitalization:  No - Comment as needed Significant Relationships:  Adult Children, Spouse Lives with:  Spouse Do you feel safe going back to the place where you live?  Yes Need for family participation in patient care:  Yes (Comment)  Care giving concerns:  Patient from home with husband. Patient experiencing weakness. PT recommended ST rehab at SNF.   Social Worker assessment / plan:  CSW spoke with patient at bedside regarding PT recommendation for ST rehab at [facility]. Patient agreeable and reported that she has not been to SNF in the past. CSW explained ST rehab at SNF process to patient. Patient reported that her daughter would be helpful in the process. Patient reported that she may need PTAR for transportation. CSW contacted patient's daughter and discussed PT recommendation, patient's daughter agreeable. CSW will completed FL2 and will provide bed offers.   Employment status:  Retired Database administrator PT Recommendations:  Skilled Nursing Facility Information / Referral to community resources:  Skilled Nursing Facility  Patient/Family's Response to care:  Patient/patient's daughter agreeable to ST rehab at [facility].  Patient/Family's Understanding of and Emotional Response  to Diagnosis, Current Treatment, and Prognosis:  Patient verbalized understanding of diagnosis and plan to discharge to SNF for ST rehab. Patient presented calm and pleasant throughout assessment. Patient reported that she is feeling better and hopeful to regain strength and return home with husband.  Emotional Assessment Appearance:  Appears stated age Attitude/Demeanor/Rapport:  Other (Cooperative) Affect (typically observed):  Calm, Appropriate Orientation:  Oriented to Self, Oriented to Place, Oriented to  Time, Oriented to Situation Alcohol / Substance use:  Not Applicable Psych involvement (Current and /or in the community):  No (Comment)  Discharge Needs  Concerns to be addressed:  No discharge needs identified Readmission within the last 30 days:  No Current discharge risk:  None Barriers to Discharge:  No Barriers Identified   Antionette Poles, LCSW 12/19/2016, 2:19 PM

## 2016-12-19 NOTE — NC FL2 (Signed)
[place] MEDICAID FL2 LEVEL OF CARE SCREENING TOOL     IDENTIFICATION  Patient Name: [name] Birthdate: Jan 18, 1932 Sex: female Admission Date (Current Location): 12/14/2016  [facility] and [place] Number:  Producer, television/film/video and Address:  [facility],  [address] [place], [place] [address]      Provider Number: [id number]  Attending Physician Name and Address:  Noralee Stain Chahn-Yan*  Relative Name and Phone Number:       Current Level of Care: Hospital Recommended Level of Care: Skilled Nursing Facility Prior Approval Number:    Date Approved/Denied:   PASRR Number: [id number]  Discharge Plan: SNF    Current Diagnoses: Patient Active Problem List   Diagnosis Date Noted  . Acute on chronic respiratory failure with hypoxia (HCC) 12/15/2016  . Acute on chronic systolic (congestive) heart failure (HCC) 12/15/2016  . Elevated troponin 12/15/2016  . Pressure injury of skin 12/15/2016  . Idiopathic scoliosis 09/12/2015  . Chronic respiratory failure (HCC) 08/19/2014  . Essential hypertension 05/04/2014  . Hyperglycemia 04/20/2014  . Encounter for therapeutic drug monitoring 07/08/2013  . CKD (chronic kidney disease), stage III 04/21/2013  . Nonischemic cardiomyopathy (HCC) 11/28/2012  . Anticoagulant Camesha Farooq-term use 10/10/2010  . Left bundle branch block   . PULMONARY FIBROSIS 04/20/2009  . GERD 04/20/2009  . EOSINOPHILIA 02/03/2009  . Atrial fibrillation (HCC) 01/18/2009  . Hypothyroidism 07/14/2007  . HYPERLIPIDEMIA 07/14/2007    Orientation RESPIRATION BLADDER Height & Weight     Self, Situation, Time, Place  O2 Continent Weight: 130 lb 15.3 oz (59.4 kg) Height:  5\' 7"  (170.2 cm)  BEHAVIORAL SYMPTOMS/MOOD NEUROLOGICAL BOWEL NUTRITION STATUS      Continent Diet (Heart)  AMBULATORY STATUS COMMUNICATION OF NEEDS Skin   Extensive Assist Verbally Other (Comment)  (PressureInjury07/14/18StageIII-Fullthicknesstissueloss.Subcutaneousfatmaybevisiblebutbone,tendonormuscleareNOTexposed. Orientation left buttocks Foam Dressing)       Pressure Injury 12/15/16 Stage II -  Partial thickness loss of dermis presenting as a shallow open ulcer with a red, pink wound bed without slough.   Location: Sacrum Location Orientation: Mid; Foam Dressing                   Personal Care Assistance Level of Assistance  Bathing, Feeding, Dressing Bathing Assistance: Limited assistance Feeding assistance: Independent Dressing Assistance: Limited assistance     Functional Limitations Info             SPECIAL CARE FACTORS FREQUENCY  PT (By licensed PT), OT (By licensed OT)     PT Frequency: 5x/week OT Frequency: 5x/week            Contractures Contractures Info: Not present    Additional Factors Info  Code Status, Allergies Code Status Info: DNR Allergies Info: Metoprolol,Lisinopril,Amoxicillin,Levaquin Levofloxacin In D5w,Doxycycline           Current Medications (12/19/2016):  This is the current hospital active medication list Current Facility-Administered Medications  Medication Dose Route Frequency Provider Last Rate Last Dose  . 0.9 %  sodium chloride infusion  250 mL Intravenous PRN Lorretta Harp, MD      . acetaminophen (TYLENOL) tablet 325 mg  325 mg Oral Q6H PRN Lorretta Harp, MD      . albuterol (PROVENTIL) (2.5 MG/3ML) 0.083% nebulizer solution 2.5 mg  2.5 mg Nebulization Q4H PRN Lorretta Harp, MD      . ALPRAZolam Prudy Feeler) tablet 0.25 mg  0.25 mg Oral BID PRN Lorretta Harp, MD      . aspirin EC tablet 81 mg  81 mg Oral  Daily Lorretta Harp, MD   81 mg at 12/19/16 1610  . dextromethorphan-guaiFENesin (MUCINEX DM) 30-600 MG per 12 hr tablet 1 tablet  1 tablet Oral BID PRN Lorretta Harp, MD      . furosemide (LASIX) tablet 40 mg  40 mg Oral Daily Dhungel, Nishant, MD   40 mg at 12/19/16 0908  . hydrALAZINE (APRESOLINE) injection 5 mg  5  mg Intravenous Q2H PRN Lorretta Harp, MD      . ipratropium-albuterol (DUONEB) 0.5-2.5 (3) MG/3ML nebulizer solution 3 mL  3 mL Nebulization TID Dhungel, Nishant, MD   3 mL at 12/19/16 0916  . lactose free nutrition (BOOST PLUS) liquid 237 mL  237 mL Oral TID WC Dhungel, Nishant, MD   237 mL at 12/19/16 0800  . levothyroxine (SYNTHROID, LEVOTHROID) tablet 50 mcg  50 mcg Oral QAC breakfast Lorretta Harp, MD   50 mcg at 12/19/16 0900  . nitroGLYCERIN (NITROSTAT) SL tablet 0.4 mg  0.4 mg Sublingual Q5 min PRN Lorretta Harp, MD      . nystatin (MYCOSTATIN/NYSTOP) topical powder 1 Bottle  1 Bottle Topical BID PRN Lorretta Harp, MD      . ondansetron [facility]) injection 4 mg  4 mg Intravenous Q6H PRN Lorretta Harp, MD      . predniSONE (DELTASONE) tablet 40 mg  40 mg Oral Q breakfast Dhungel, Nishant, MD   40 mg at 12/19/16 0900  . sodium chloride flush (NS) 0.9 % injection 3 mL  3 mL Intravenous Q12H Lorretta Harp, MD   3 mL at 12/19/16 1000  . sodium chloride flush (NS) 0.9 % injection 3 mL  3 mL Intravenous PRN Lorretta Harp, MD      . warfarin (COUMADIN) tablet 2.5 mg  2.5 mg Oral ONCE-1800 Green, Terri L, RPH      . Warfarin - Pharmacist Dosing Inpatient   Does not apply q1800 Lorenza Evangelist, [facility]   Stopped at 12/16/16 1800  . zolpidem (AMBIEN) tablet 5 mg  5 mg Oral QHS PRN Lorretta Harp, MD         Discharge Medications: Please see discharge summary for a list of discharge medications.  Relevant Imaging Results:  Relevant Lab Results:   Additional Information  SSN [id number]  Antionette Poles, LCSW

## 2016-12-19 NOTE — Progress Notes (Signed)
ANTICOAGULATION CONSULT NOTE   Pharmacy Consult for Warfarin Indication: atrial fibrillation  Allergies  Allergen Reactions  . Doxycycline Nausea Only  . Levaquin [Levofloxacin In D5w]     REACTION: leg weakness  . Amoxicillin     REACTION: upset stomach  . Lisinopril     cough  . Metoprolol     diarrhea   Height: 5\' 7"  (170.2 cm) Weight: 130 lb 15.3 oz (59.4 kg) IBW/kg (Calculated) : 61.6  Vital Signs: Temp: 97.8 F (36.6 C) (07/18 0625) Temp Source: Oral (07/18 0625) BP: 128/53 (07/18 0625) Pulse Rate: 65 (07/18 0916)  Labs:  Recent Labs  12/17/16 0411 12/18/16 0420 12/18/16 2121 12/19/16 0401  HGB  --   --   --  14.9  HCT  --   --   --  45.8  PLT  --   --   --  220  LABPROT 30.5* 31.3*  --  29.1*  INR 2.84 2.95  --  2.69  CREATININE 1.50* 1.37* 1.54* 1.39*   Estimated Creatinine Clearance: 29.3 mL/min (A) (by C-G formula based on SCr of 1.39 mg/dL (H)).  Medical History: Past Medical History:  Diagnosis Date  . Atrial fibrillation (HCC)    Paroxysmal  . Cystitis 1974  . DJD (degenerative joint disease)    Dr Kellie Simmering  . Hyperlipidemia 2005   NMR LIPOPROFILE: LDL 144 (1570/732), HDL 61, TG 103., LDL GOAL = < 130  . Idiopathic pulmonary fibrosis    IDIOPATHIC vs CHRONIC ERD  . Left bundle branch block   . Obstructive sleep apnea    CPAP Intolerant  . Thyroid disease    HYPOTHROIDISM   Medications:  Scheduled:  . aspirin EC  81 mg Oral Daily  . furosemide  40 mg Oral Daily  . ipratropium-albuterol  3 mL Nebulization TID  . lactose free nutrition  237 mL Oral TID WC  . levothyroxine  50 mcg Oral QAC breakfast  . predniSONE  40 mg Oral Q breakfast  . sodium chloride flush  3 mL Intravenous Q12H  . Warfarin - Pharmacist Dosing Inpatient   Does not apply q1800   Assessment: 70 yoF admitted with SOB on chronic warfarin for A-fib. Pharmacy asked to assist with warfarin dosing inpatient. HD 5 mg Mon/Fri and 2.5 mg other days.  INR=2.13 on  admission  Today, 12/19/16   INR 2.69, in therapeutic range  CBC stable, unchanged. Hgb 14.9, plt 220 ( 7/18)   Goal of Therapy:  INR 2-3   Plan:   Warfarin 2.5 mg today  Daily PT/INR  Monitor CBC, s/s bleeding  Otho Bellows PharmD Pager [phone] 12/19/2016, 11:18 AM

## 2016-12-19 NOTE — Progress Notes (Signed)
Chaplain saw pt while rounding on unit.  Pt expressed exhaustion and "feeling drained."  She understands she is going to try physical therapy today, but is apprehensive given her level of exhaustion.  Received phone call from family member while chaplain in room.  Chaplain will follow up for continued support around hospitalization, decline in function, exhaustion.   [facility] Chaplain Burnis Kingfisher, MDiv

## 2016-12-19 NOTE — Progress Notes (Signed)
Physical Therapy Treatment Patient Details Name: [name] MRN: [id number] DOB: [birth year] Today's Date: 12/19/2016    History of Present Illness 83 y.o. female with medical history significant of hyperlipidemia, chronic respiratory failure on 2-4 liter oxygen at home, hypothyroidism, OSA not on CPAP, left bundle blockage, idiopathic pulmonary fibrosis, atrial fibrillation on Coumadin, sCHF with EF of 35%, who presents with shortness of breath    PT Comments    Progressing slowly with mobility however pt has not progressed enough to safely d/c home alone. She remains significantly weak and she fatigues easily with minimal activity. Discharge plan has been updated and recommendation is for ST rehab at SNF. Will continue to follow and progress activity as able.    Follow Up Recommendations  SNF     Equipment Recommendations       Recommendations for Other Services OT consult     Precautions / Restrictions Precautions Precautions: Fall Precaution Comments: O2 dep Restrictions Weight Bearing Restrictions: No    Mobility  Bed Mobility Overal bed mobility: Needs Assistance Bed Mobility: Supine to Sit;Sit to Supine     Supine to sit: Min guard;HOB elevated Sit to supine: Min guard;HOB elevated   General bed mobility comments: Increased time and effort.   Transfers Overall transfer level: Needs assistance Equipment used: Rolling walker (2 wheeled) Transfers: Sit to/from Stand Sit to Stand: Min assist         General transfer comment: Assist to rise, stabilize, control descent. VCs safety.   Ambulation/Gait Ambulation/Gait assistance: Min assist Ambulation Distance (Feet): 4 Feet Assistive device: Rolling walker (2 wheeled) Gait Pattern/deviations: Step-through pattern;Decreased stride length;Trunk flexed     General Gait Details: Pt was able to take a few steps forwards and backwards with RW. Seated rest break taken due to fatigue. Pt stood a 2nd time and took  3-4 side steps towards [facility] with RW. Remained ono [place] O2. Pt fatigues very quickly and easily.    Stairs            Wheelchair Mobility    Modified Rankin (Stroke Patients Only)       Balance Overall balance assessment: Needs assistance   Sitting balance-Leahy Scale: Good     Standing balance support: Bilateral upper extremity supported Standing balance-Leahy Scale: Poor                              Cognition Arousal/Alertness: Awake/alert Behavior During Therapy: WFL for tasks assessed/performed Overall Cognitive Status: Within Functional Limits for tasks assessed                                        Exercises      General Comments        Pertinent Vitals/Pain Pain Assessment: No/denies pain    Home Living                      Prior Function            PT Goals (current goals can now be found in the care plan section) Progress towards PT goals: Progressing toward goals (very slowly. Progression not enough to d/c home safely-SNF has now been recommended. )    Frequency    Min 3X/week      PT Plan Discharge plan needs to be updated    Co-evaluation  AM-PAC PT "6 Clicks" Daily Activity  Outcome Measure  Difficulty turning over in bed (including adjusting bedclothes, sheets and blankets)?: A Little Difficulty moving from lying on back to sitting on the side of the bed? : A Little Difficulty sitting down on and standing up from a chair with arms (e.g., wheelchair, bedside commode, etc,.)?: Total Help needed moving to and from a bed to chair (including a wheelchair)?: A Little Help needed walking in hospital room?: A Little Help needed climbing 3-5 steps with a railing? : A Little 6 Click Score: 16    End of Session Equipment Utilized During Treatment: Oxygen Activity Tolerance: Patient limited by fatigue Patient left: in bed;with call bell/phone within reach;with bed alarm set   PT  Visit Diagnosis: Muscle weakness (generalized) (M62.81);Difficulty in walking, not elsewhere classified (R26.2)     Time: 9163-8466 PT Time Calculation (min) (ACUTE ONLY): 17 min  Charges:  $Gait Training: 8-22 mins                    G Codes:          Rebeca Alert, MPT Pager: [phone]

## 2016-12-19 NOTE — Clinical Social Work Placement (Signed)
Patient received and accepted bed offer at [facility]. PT[facility] contacted, patient's family notified. Patient's RN can call report to [phone], room [address] [place].   CLINICAL SOCIAL WORK PLACEMENT  NOTE  Date:  12/19/2016  Patient Details  Name: [name] MRN: [id number] Date of Birth: 12-20-1931  Clinical Social Work is seeking post-discharge placement for this patient at the Skilled  Nursing Facility level of care (*CSW will initial, date and re-position this form in  chart as items are completed):  Yes   Patient/family provided with [facility] Clinical Social Work Department's list of facilities offering this level of care within the geographic area requested by the patient (or if unable, by the patient's family).  Yes   Patient/family informed of their freedom to choose among providers that offer the needed level of care, that participate in Medicare, Medicaid or managed care program needed by the patient, have an available bed and are willing to accept the patient.  Yes   Patient/family informed of [facility]'s ownership interest in [facility] and [facility], as well as of the fact that they are under no obligation to receive care at these facilities.  PASRR submitted to EDS on 12/19/16     PASRR number received on 12/19/16     Existing PASRR number confirmed on       FL2 transmitted to all facilities in geographic area requested by pt/family on 12/19/16     FL2 transmitted to all facilities within larger geographic area on       Patient informed that his/her managed care company has contracts with or will negotiate with certain facilities, including the following:        Yes   Patient/family informed of bed offers received.  Patient chooses bed at [facility]     Physician recommends and patient chooses bed at      Patient to be transferred to [facility] on 12/19/16.  Patient to be transferred to facility by PTAR     Patient family  notified on 12/19/16 of transfer.  Name of family member notified:  [facility]     PHYSICIAN       Additional Comment:    _______________________________________________ Antionette Poles, LCSW 12/19/2016, 3:15 PM

## 2016-12-20 ENCOUNTER — Telehealth: Payer: Self-pay | Admitting: *Deleted

## 2016-12-20 NOTE — Telephone Encounter (Signed)
Pt was on TCM list admitted 7/13, for acute hypoxic respiratory failure second to CHF. Pt was D/C 7/18, and sent to SNF. On D/C summary it states pt will need to f/u w/PCP 1 week after being discharge from SNF...Raechel Chute

## 2016-12-27 ENCOUNTER — Ambulatory Visit: Payer: Medicare Other | Admitting: Pulmonary Disease

## 2017-01-02 DIAGNOSIS — R278 Other lack of coordination: Secondary | ICD-10-CM | POA: Diagnosis not present

## 2017-01-02 DIAGNOSIS — Z09 Encounter for follow-up examination after completed treatment for conditions other than malignant neoplasm: Secondary | ICD-10-CM | POA: Diagnosis not present

## 2017-01-02 DIAGNOSIS — L8943 Pressure ulcer of contiguous site of back, buttock and hip, stage 3: Secondary | ICD-10-CM | POA: Diagnosis not present

## 2017-01-02 DIAGNOSIS — J84112 Idiopathic pulmonary fibrosis: Secondary | ICD-10-CM | POA: Diagnosis not present

## 2017-01-02 DIAGNOSIS — M6281 Muscle weakness (generalized): Secondary | ICD-10-CM | POA: Diagnosis not present

## 2017-01-02 DIAGNOSIS — D72829 Elevated white blood cell count, unspecified: Secondary | ICD-10-CM | POA: Diagnosis not present

## 2017-01-02 DIAGNOSIS — R531 Weakness: Secondary | ICD-10-CM | POA: Diagnosis not present

## 2017-01-02 DIAGNOSIS — R2681 Unsteadiness on feet: Secondary | ICD-10-CM | POA: Diagnosis not present

## 2017-01-02 DIAGNOSIS — R05 Cough: Secondary | ICD-10-CM | POA: Diagnosis not present

## 2017-01-02 DIAGNOSIS — L89150 Pressure ulcer of sacral region, unstageable: Secondary | ICD-10-CM | POA: Diagnosis not present

## 2017-01-02 DIAGNOSIS — J9611 Chronic respiratory failure with hypoxia: Secondary | ICD-10-CM | POA: Diagnosis not present

## 2017-01-02 DIAGNOSIS — J189 Pneumonia, unspecified organism: Secondary | ICD-10-CM

## 2017-01-02 DIAGNOSIS — R069 Unspecified abnormalities of breathing: Secondary | ICD-10-CM | POA: Diagnosis not present

## 2017-01-02 DIAGNOSIS — R5381 Other malaise: Secondary | ICD-10-CM | POA: Diagnosis not present

## 2017-01-02 HISTORY — DX: Pneumonia, unspecified organism: J18.9

## 2017-01-03 DIAGNOSIS — R069 Unspecified abnormalities of breathing: Secondary | ICD-10-CM | POA: Diagnosis not present

## 2017-01-03 DIAGNOSIS — R531 Weakness: Secondary | ICD-10-CM | POA: Diagnosis not present

## 2017-01-03 DIAGNOSIS — M6281 Muscle weakness (generalized): Secondary | ICD-10-CM | POA: Diagnosis not present

## 2017-01-03 DIAGNOSIS — R5381 Other malaise: Secondary | ICD-10-CM | POA: Diagnosis not present

## 2017-01-03 DIAGNOSIS — L8943 Pressure ulcer of contiguous site of back, buttock and hip, stage 3: Secondary | ICD-10-CM | POA: Diagnosis not present

## 2017-01-04 DIAGNOSIS — R069 Unspecified abnormalities of breathing: Secondary | ICD-10-CM | POA: Diagnosis not present

## 2017-01-04 DIAGNOSIS — R5381 Other malaise: Secondary | ICD-10-CM | POA: Diagnosis not present

## 2017-01-04 DIAGNOSIS — L8943 Pressure ulcer of contiguous site of back, buttock and hip, stage 3: Secondary | ICD-10-CM | POA: Diagnosis not present

## 2017-01-04 DIAGNOSIS — M6281 Muscle weakness (generalized): Secondary | ICD-10-CM | POA: Diagnosis not present

## 2017-01-04 DIAGNOSIS — R531 Weakness: Secondary | ICD-10-CM | POA: Diagnosis not present

## 2017-01-07 DIAGNOSIS — R069 Unspecified abnormalities of breathing: Secondary | ICD-10-CM | POA: Diagnosis not present

## 2017-01-07 DIAGNOSIS — R5381 Other malaise: Secondary | ICD-10-CM | POA: Diagnosis not present

## 2017-01-07 DIAGNOSIS — M6281 Muscle weakness (generalized): Secondary | ICD-10-CM | POA: Diagnosis not present

## 2017-01-07 DIAGNOSIS — R531 Weakness: Secondary | ICD-10-CM | POA: Diagnosis not present

## 2017-01-07 DIAGNOSIS — L8943 Pressure ulcer of contiguous site of back, buttock and hip, stage 3: Secondary | ICD-10-CM | POA: Diagnosis not present

## 2017-01-14 DIAGNOSIS — L89150 Pressure ulcer of sacral region, unstageable: Secondary | ICD-10-CM | POA: Diagnosis not present

## 2017-01-14 DIAGNOSIS — M6281 Muscle weakness (generalized): Secondary | ICD-10-CM | POA: Diagnosis not present

## 2017-01-14 DIAGNOSIS — J9611 Chronic respiratory failure with hypoxia: Secondary | ICD-10-CM | POA: Diagnosis not present

## 2017-01-14 DIAGNOSIS — J84112 Idiopathic pulmonary fibrosis: Secondary | ICD-10-CM | POA: Diagnosis not present

## 2017-01-21 DIAGNOSIS — L89150 Pressure ulcer of sacral region, unstageable: Secondary | ICD-10-CM | POA: Diagnosis not present

## 2017-01-22 DIAGNOSIS — L8943 Pressure ulcer of contiguous site of back, buttock and hip, stage 3: Secondary | ICD-10-CM | POA: Diagnosis not present

## 2017-01-22 DIAGNOSIS — M6281 Muscle weakness (generalized): Secondary | ICD-10-CM | POA: Diagnosis not present

## 2017-01-22 DIAGNOSIS — R531 Weakness: Secondary | ICD-10-CM | POA: Diagnosis not present

## 2017-01-22 DIAGNOSIS — R5381 Other malaise: Secondary | ICD-10-CM | POA: Diagnosis not present

## 2017-01-22 DIAGNOSIS — R069 Unspecified abnormalities of breathing: Secondary | ICD-10-CM | POA: Diagnosis not present

## 2017-01-27 DIAGNOSIS — J84112 Idiopathic pulmonary fibrosis: Secondary | ICD-10-CM | POA: Diagnosis not present

## 2017-01-27 DIAGNOSIS — M4120 Other idiopathic scoliosis, site unspecified: Secondary | ICD-10-CM | POA: Diagnosis not present

## 2017-01-27 DIAGNOSIS — I5082 Biventricular heart failure: Secondary | ICD-10-CM | POA: Diagnosis not present

## 2017-01-27 DIAGNOSIS — G4733 Obstructive sleep apnea (adult) (pediatric): Secondary | ICD-10-CM | POA: Diagnosis not present

## 2017-01-27 DIAGNOSIS — N183 Chronic kidney disease, stage 3 (moderate): Secondary | ICD-10-CM | POA: Diagnosis not present

## 2017-01-27 DIAGNOSIS — L89152 Pressure ulcer of sacral region, stage 2: Secondary | ICD-10-CM | POA: Diagnosis not present

## 2017-01-27 DIAGNOSIS — I2729 Other secondary pulmonary hypertension: Secondary | ICD-10-CM | POA: Diagnosis not present

## 2017-01-27 DIAGNOSIS — I5023 Acute on chronic systolic (congestive) heart failure: Secondary | ICD-10-CM | POA: Diagnosis not present

## 2017-01-27 DIAGNOSIS — J9621 Acute and chronic respiratory failure with hypoxia: Secondary | ICD-10-CM | POA: Diagnosis not present

## 2017-01-27 DIAGNOSIS — E440 Moderate protein-calorie malnutrition: Secondary | ICD-10-CM | POA: Diagnosis not present

## 2017-01-27 DIAGNOSIS — I130 Hypertensive heart and chronic kidney disease with heart failure and stage 1 through stage 4 chronic kidney disease, or unspecified chronic kidney disease: Secondary | ICD-10-CM | POA: Diagnosis not present

## 2017-01-27 DIAGNOSIS — I4891 Unspecified atrial fibrillation: Secondary | ICD-10-CM | POA: Diagnosis not present

## 2017-01-27 DIAGNOSIS — I259 Chronic ischemic heart disease, unspecified: Secondary | ICD-10-CM | POA: Diagnosis not present

## 2017-01-28 ENCOUNTER — Telehealth: Payer: Self-pay | Admitting: Internal Medicine

## 2017-01-28 NOTE — Progress Notes (Deleted)
Subjective:    Patient ID: [name], female    DOB: April 08, 1932, 83 y.o.   MRN: [id number]  HPI The patient is here for follow up from the hospital.  Admitted 12/14/16 - 12/19/16 from home for acute on chronic respiratory failure secondary to CHF exacerbation and possible exacerbation of pulmonary fibrosis.   She was discharged to SNF.   She presented to the ED with SOB x 3 days that was progressively getting worse.  She is on oxygen 2-4 L at home and was requiring more.  She had a dry cough, but not fever, chest pain, nausea, UTI symptoms.  She had a positive troponin, BNP of 1958.3, WBC 12.2 and slightly worse renal function, FOBT negative, oxygen sat 91-95% on 5 L.     Acute on chronic resp failure with hypoxia, acute on chronic systolic CHF:  Right heart failure and worsening of pulmonary fibrosis.  Echo showed EF 55-60%, moderate to severely reduced RX systolic function and increase PA pressure.  Diuresed with IV lasix and transitioned to oral lasix.  IV steroids transitioned to oral prednisone.  Discharged on steroid taper down to 10 mg which is her chronic dose.    Idiopathic pulmonary fibrosis:  Acute worsening.  Discharged on oral prednisone and neb treatments.    Elevated troponin:  Demand ischemia with acute CHF. Troponin peaked at 0.14.  No chest pain during admisstion  Hypothyroidism, Afib: remained stable  AKI on CKD:  Mildly worsened with IV lasix. CR then improved  Pressure injury to left buttock and sacrum:  Stage 3 pressure ulcer and pressure injury. Wound care was consulted.   Discharged to SNF for generalized weakness.      Medications and allergies reviewed with patient and updated if appropriate.  Patient Active Problem List   Diagnosis Date Noted  . Acute on chronic respiratory failure with hypoxia (HCC) 12/15/2016  . Acute on chronic systolic (congestive) heart failure (HCC) 12/15/2016  . Elevated troponin 12/15/2016  . Pressure injury of skin  12/15/2016  . Idiopathic scoliosis 09/12/2015  . Chronic respiratory failure (HCC) 08/19/2014  . Essential hypertension 05/04/2014  . Hyperglycemia 04/20/2014  . Encounter for therapeutic drug monitoring 07/08/2013  . CKD (chronic kidney disease), stage III 04/21/2013  . Nonischemic cardiomyopathy (HCC) 11/28/2012  . Anticoagulant long-term use 10/10/2010  . Left bundle branch block   . PULMONARY FIBROSIS 04/20/2009  . GERD 04/20/2009  . EOSINOPHILIA 02/03/2009  . Atrial fibrillation (HCC) 01/18/2009  . Hypothyroidism 07/14/2007  . HYPERLIPIDEMIA 07/14/2007    Current Outpatient Prescriptions on File Prior to Visit  Medication Sig Dispense Refill  . acetaminophen (TYLENOL) 325 MG tablet Take 325 mg by mouth every 6 (six) hours as needed for pain.    Marland Kitchen albuterol (PROVENTIL HFA;VENTOLIN HFA) 108 (90 Base) MCG/ACT inhaler Inhale 2 puffs into the lungs every 6 (six) hours as needed for wheezing or shortness of breath. 1 Inhaler 3  . furosemide (LASIX) 20 MG tablet Take 2 tablets (40 mg total) by mouth daily. 2 tablet 0  . levothyroxine (SYNTHROID, LEVOTHROID) 50 MCG tablet Take 1 tablet (50 mcg total) by mouth daily before breakfast. -- Office visit needed for further refills 90 tablet 0  . nystatin (MYCOSTATIN/NYSTOP) 100000 UNIT/GM POWD Apply twice a day as needed (Patient taking differently: Apply 1 Bottle topically 2 (two) times daily as needed (skin irritation). ) 30 g 4  . predniSONE (DELTASONE) 10 MG tablet Take 4 tabs for 3 days, then 3 tabs for  3 days, then 2 tabs for 3 days, then 1 tab daily 32 tablet 0  . warfarin (COUMADIN) 5 MG tablet TAKE ONE TABLET BY MOUTH AS DIRECTED BY  ANTICOAGULATION CLINIC (Patient taking differently: TAKE ONE TABLET (5 MG) BY MOUTH ON MON AND FRI AND TAKE 0.5 TABLET (2.5 MG) THE REMAINING DAYS OF THE WEEK) 90 tablet 1   No current facility-administered medications on file prior to visit.     Past Medical History:  Diagnosis Date  . Atrial  fibrillation (HCC)    Paroxysmal  . Cystitis 1974  . DJD (degenerative joint disease)    Dr Kellie Simmering  . Hyperlipidemia 2005   NMR LIPOPROFILE: LDL 144 (1570/732), HDL 61, TG 103., LDL GOAL = < 130  . Idiopathic pulmonary fibrosis    IDIOPATHIC vs CHRONIC ERD  . Left bundle branch block   . Obstructive sleep apnea    CPAP Intolerant  . Thyroid disease    HYPOTHROIDISM    Past Surgical History:  Procedure Laterality Date  . ABDOMINAL HYSTERECTOMY     Abnormal pap smear; ovaries remaining  . APPENDECTOMY  1966  . CARDIOVERSION N/A 11/07/2012   Procedure: CARDIOVERSION;  Surgeon: Cassell Clement, MD;  Location: [facility] ENDOSCOPY;  Service: Cardiovascular;  Laterality: N/A;  . CHOLECYSTECTOMY  1966  . COLONOSCOPY  2004   negative  . CYSTOSCOPY     Hematuria     Social History   Social History  . Marital status: Married    Spouse name: N/A  . Number of children: 1  . Years of education: N/A   Occupational History  . Retired Retired    [employer]   Social History Main Topics  . Smoking status: Former Smoker    Packs/day: 0.25    Years: 30.00    Types: Cigarettes    Quit date: 06/04/1965  . Smokeless tobacco: Never Used     Comment: smoked 1953-1967, up to < 1/4 ppd  . Alcohol use Yes     Comment: Socially only  . Drug use: No  . Sexual activity: Not on file   Other Topics Concern  . Not on file   Social History Narrative   NO REGULAR EXERCISE   11/03/09 [employer] SIGNED APPOINTING HUSBAND JAMES Labonte; OK TO LEAVE MESSAGE ON HOME # [phone]    Family History  Problem Relation Age of Onset  . Coronary artery disease Father   . Transient ischemic attack Father   . Stroke Father 29  . Asthma Mother   . Cancer Daughter        R breast; S/P mastectomy  . Diabetes Neg Hx     Review of Systems     Objective:  There were no vitals filed for this visit. Wt Readings from Last 3 Encounters:  12/19/16 130 lb 15.3 oz (59.4 kg)  09/11/16 136 lb (61.7 kg)    06/19/16 135 lb (61.2 kg)   There is no height or weight on file to calculate BMI.   Physical Exam    Constitutional: Appears well-developed and well-nourished. No distress.  HENT:  Head: Normocephalic and atraumatic.  Neck: Neck supple. No tracheal deviation present. No thyromegaly present.  No cervical lymphadenopathy Cardiovascular: Normal rate, regular rhythm and normal heart sounds.   No murmur heard. No carotid bruit .  No edema Pulmonary/Chest: Effort normal and breath sounds normal. No respiratory distress. No has no wheezes. No rales.  Skin: Skin is warm and dry. Not diaphoretic.  Psychiatric: Normal  mood and affect. Behavior is normal.      Assessment & Plan:    See Problem List for Assessment and Plan of chronic medical problems.

## 2017-01-28 NOTE — Telephone Encounter (Signed)
Noted.  Patient has been followed up with.

## 2017-01-28 NOTE — Telephone Encounter (Signed)
[name], can you please follow up with patient. She would not give me details, she states she had already left the information with someone this morning. She was not going to again. Patient seemed to be every upset and worked up about something. She just kept stating I need to speak with [facility]. Thank you.

## 2017-01-29 ENCOUNTER — Ambulatory Visit: Payer: Medicare Other | Admitting: Internal Medicine

## 2017-01-29 ENCOUNTER — Telehealth: Payer: Self-pay | Admitting: Internal Medicine

## 2017-01-29 ENCOUNTER — Ambulatory Visit (INDEPENDENT_AMBULATORY_CARE_PROVIDER_SITE_OTHER): Payer: Medicare Other | Admitting: General Practice

## 2017-01-29 ENCOUNTER — Other Ambulatory Visit (INDEPENDENT_AMBULATORY_CARE_PROVIDER_SITE_OTHER): Payer: Medicare Other

## 2017-01-29 ENCOUNTER — Ambulatory Visit (INDEPENDENT_AMBULATORY_CARE_PROVIDER_SITE_OTHER): Payer: Medicare Other | Admitting: Internal Medicine

## 2017-01-29 ENCOUNTER — Encounter: Payer: Self-pay | Admitting: Internal Medicine

## 2017-01-29 VITALS — BP 112/56 | HR 77 | Resp 20 | Wt 129.0 lb

## 2017-01-29 DIAGNOSIS — J9621 Acute and chronic respiratory failure with hypoxia: Secondary | ICD-10-CM | POA: Diagnosis not present

## 2017-01-29 DIAGNOSIS — I4891 Unspecified atrial fibrillation: Secondary | ICD-10-CM | POA: Diagnosis not present

## 2017-01-29 DIAGNOSIS — M4120 Other idiopathic scoliosis, site unspecified: Secondary | ICD-10-CM | POA: Diagnosis not present

## 2017-01-29 DIAGNOSIS — R5381 Other malaise: Secondary | ICD-10-CM | POA: Diagnosis not present

## 2017-01-29 DIAGNOSIS — E440 Moderate protein-calorie malnutrition: Secondary | ICD-10-CM | POA: Diagnosis not present

## 2017-01-29 DIAGNOSIS — Z23 Encounter for immunization: Secondary | ICD-10-CM

## 2017-01-29 DIAGNOSIS — I130 Hypertensive heart and chronic kidney disease with heart failure and stage 1 through stage 4 chronic kidney disease, or unspecified chronic kidney disease: Secondary | ICD-10-CM | POA: Diagnosis not present

## 2017-01-29 DIAGNOSIS — L89152 Pressure ulcer of sacral region, stage 2: Secondary | ICD-10-CM | POA: Diagnosis not present

## 2017-01-29 DIAGNOSIS — E039 Hypothyroidism, unspecified: Secondary | ICD-10-CM

## 2017-01-29 DIAGNOSIS — J8410 Pulmonary fibrosis, unspecified: Secondary | ICD-10-CM | POA: Diagnosis not present

## 2017-01-29 DIAGNOSIS — G4733 Obstructive sleep apnea (adult) (pediatric): Secondary | ICD-10-CM | POA: Diagnosis not present

## 2017-01-29 DIAGNOSIS — I5023 Acute on chronic systolic (congestive) heart failure: Secondary | ICD-10-CM | POA: Diagnosis not present

## 2017-01-29 DIAGNOSIS — L89309 Pressure ulcer of unspecified buttock, unspecified stage: Secondary | ICD-10-CM

## 2017-01-29 DIAGNOSIS — I10 Essential (primary) hypertension: Secondary | ICD-10-CM

## 2017-01-29 DIAGNOSIS — N183 Chronic kidney disease, stage 3 unspecified: Secondary | ICD-10-CM

## 2017-01-29 DIAGNOSIS — Z5181 Encounter for therapeutic drug level monitoring: Secondary | ICD-10-CM

## 2017-01-29 DIAGNOSIS — J84112 Idiopathic pulmonary fibrosis: Secondary | ICD-10-CM | POA: Diagnosis not present

## 2017-01-29 DIAGNOSIS — I5082 Biventricular heart failure: Secondary | ICD-10-CM | POA: Diagnosis not present

## 2017-01-29 DIAGNOSIS — I428 Other cardiomyopathies: Secondary | ICD-10-CM

## 2017-01-29 DIAGNOSIS — I259 Chronic ischemic heart disease, unspecified: Secondary | ICD-10-CM | POA: Diagnosis not present

## 2017-01-29 DIAGNOSIS — I2729 Other secondary pulmonary hypertension: Secondary | ICD-10-CM | POA: Diagnosis not present

## 2017-01-29 LAB — CBC WITH DIFFERENTIAL/PLATELET
Basophils Absolute: 0.1 10*3/uL (ref 0.0–0.1)
Basophils Relative: 0.5 % (ref 0.0–3.0)
EOS PCT: 0.4 % (ref 0.0–5.0)
Eosinophils Absolute: 0.1 10*3/uL (ref 0.0–0.7)
HCT: 48.9 % — ABNORMAL HIGH (ref 36.0–46.0)
Hemoglobin: 16.2 g/dL — ABNORMAL HIGH (ref 12.0–15.0)
LYMPHS ABS: 1.3 10*3/uL (ref 0.7–4.0)
Lymphocytes Relative: 9.3 % — ABNORMAL LOW (ref 12.0–46.0)
MCHC: 33 g/dL (ref 30.0–36.0)
MCV: 100.3 fl — AB (ref 78.0–100.0)
MONO ABS: 0.4 10*3/uL (ref 0.1–1.0)
MONOS PCT: 2.6 % — AB (ref 3.0–12.0)
NEUTROS ABS: 12.5 10*3/uL — AB (ref 1.4–7.7)
NEUTROS PCT: 87.2 % — AB (ref 43.0–77.0)
PLATELETS: 236 10*3/uL (ref 150.0–400.0)
RBC: 4.88 Mil/uL (ref 3.87–5.11)
RDW: 15.6 % — ABNORMAL HIGH (ref 11.5–15.5)
WBC: 14.3 10*3/uL — ABNORMAL HIGH (ref 4.0–10.5)

## 2017-01-29 LAB — COMPREHENSIVE METABOLIC PANEL
ALK PHOS: 47 U/L (ref 39–117)
ALT: 26 U/L (ref 0–35)
AST: 27 U/L (ref 0–37)
Albumin: 3.4 g/dL — ABNORMAL LOW (ref 3.5–5.2)
BILIRUBIN TOTAL: 0.9 mg/dL (ref 0.2–1.2)
BUN: 38 mg/dL — AB (ref 6–23)
CO2: 31 meq/L (ref 19–32)
Calcium: 9.4 mg/dL (ref 8.4–10.5)
Chloride: 100 mEq/L (ref 96–112)
Creatinine, Ser: 1.41 mg/dL — ABNORMAL HIGH (ref 0.40–1.20)
GFR: 37.85 mL/min — AB (ref >60.00)
GLUCOSE: 125 mg/dL — AB (ref 70–99)
POTASSIUM: 4.5 meq/L (ref 3.5–5.1)
SODIUM: 139 meq/L (ref 135–145)
TOTAL PROTEIN: 6.5 g/dL (ref 6.0–8.3)

## 2017-01-29 LAB — BRAIN NATRIURETIC PEPTIDE: PRO B NATRI PEPTIDE: 1247 pg/mL — AB (ref 0.0–100.0)

## 2017-01-29 LAB — POCT INR: INR: 2.4

## 2017-01-29 MED ORDER — PREDNISONE 20 MG PO TABS: 20.0000 mg | ORAL_TABLET | Freq: Every day | ORAL | Status: AC

## 2017-01-29 NOTE — Patient Instructions (Addendum)
  Test(s) ordered today. Your results will be released to MyChart (or called to you) after review, usually within 72hours after test completion. If any changes need to be made, you will be notified at that same time.  All other Health Maintenance issues reviewed.   All recommended immunizations and age-appropriate screenings are up-to-date or discussed.  No immunizations administered today.   Medications reviewed and updated.  Changes include increasing the lasix back to 40 mg daily.   Please followup in 4-6 weeks

## 2017-01-29 NOTE — Assessment & Plan Note (Signed)
BP good Only on lasix Monitor

## 2017-01-29 NOTE — Progress Notes (Signed)
Subjective:    Patient ID: [name], female    DOB: 1931-10-08, 83 y.o.   MRN: [id number]  HPI The patient is here for follow up from the hospital.  Discharged from [facility] three days ago.    She was hospitalized 12/14/16 - 12/19/16 for acute on chronic hypoxic respiratory failure due to CHF exacerbation and exacerabation of pulmonary fibrosis.   She presented to the ED with SOB and leg edema.  The SOB started three days preior and was progressively worsening.  shewas requiring more oxygen.  She had a mild dry cough.  She did not have chest pain, nausea, fever or abdominal pain.  There were no urainry symtpoms.    She had a positive troponin, elevated BNP (1958), wbc of 12.2, worsened GFR, negative FOBT, oxygen sat 91-95% on 5 L [place].  CXR was negative for acute infection.    Acute on chronic resp failure with hypoxia, acute on chronic CHF:  2D echo showed improved EF of 55-60% and mod-sev reduced RV systolic function and increase PA pressure Diuresed with IV lasix and transitioned to oral lasix 40 mg daily Continued on oxygen 4 L [facility] place her lasix was changed to 20 mg daily She feels SOB but it has not worsened since leaving rehab.  Her leg swelling has increased in the past few days.   Idiopathic pulm fibrosis: Continued on oral prednisone and nebs She was discharged from the hospital on a prednisone taper to stay at 10 mg daily, but she developed PNA at rehab and is now taking 20 mg of prednisone daily.   Elevated troponin: Demand ischemia with acute chf  AKI on CKD: Improved   Pressure injury to left burrock and sacrum: Stage 3 in hospital Improved at [facility] Home visiting nurse has seen it and will montior   She developed double PNA at [facility].  Her breathing she states is now clear, but not breathing as deep.  It is hard to take deep breaths.  She is on the 4L / [place].  She has not seen pulmonary yet.  She has nebulizer solution but no machine  at home - this did help in the hospital.      Medications and allergies reviewed with patient and updated if appropriate.  Patient Active Problem List   Diagnosis Date Noted  . Acute on chronic respiratory failure with hypoxia (HCC) 12/15/2016  . Acute on chronic systolic (congestive) heart failure (HCC) 12/15/2016  . Elevated troponin 12/15/2016  . Pressure injury of skin 12/15/2016  . Idiopathic scoliosis 09/12/2015  . Chronic respiratory failure (HCC) 08/19/2014  . Essential hypertension 05/04/2014  . Hyperglycemia 04/20/2014  . Encounter for therapeutic drug monitoring 07/08/2013  . CKD (chronic kidney disease), stage III 04/21/2013  . Nonischemic cardiomyopathy (HCC) 11/28/2012  . Anticoagulant long-term use 10/10/2010  . Left bundle branch block   . PULMONARY FIBROSIS 04/20/2009  . GERD 04/20/2009  . EOSINOPHILIA 02/03/2009  . Atrial fibrillation (HCC) 01/18/2009  . Hypothyroidism 07/14/2007  . HYPERLIPIDEMIA 07/14/2007    Current Outpatient Prescriptions on File Prior to Visit  Medication Sig Dispense Refill  . acetaminophen (TYLENOL) 325 MG tablet Take 325 mg by mouth every 6 (six) hours as needed for pain.    Marland Kitchen albuterol (PROVENTIL HFA;VENTOLIN HFA) 108 (90 Base) MCG/ACT inhaler Inhale 2 puffs into the lungs every 6 (six) hours as needed for wheezing or shortness of breath. 1 Inhaler 3  . furosemide (LASIX) 20 MG  tablet Take 2 tablets (40 mg total) by mouth daily. 2 tablet 0  . levothyroxine (SYNTHROID, LEVOTHROID) 50 MCG tablet Take 1 tablet (50 mcg total) by mouth daily before breakfast. -- Office visit needed for further refills 90 tablet 0  . nystatin (MYCOSTATIN/NYSTOP) 100000 UNIT/GM POWD Apply twice a day as needed (Patient taking differently: Apply 1 Bottle topically 2 (two) times daily as needed (skin irritation). ) 30 g 4  . predniSONE (DELTASONE) 10 MG tablet Take 4 tabs for 3 days, then 3 tabs for 3 days, then 2 tabs for 3 days, then 1 tab daily 32 tablet 0    . warfarin (COUMADIN) 5 MG tablet TAKE ONE TABLET BY MOUTH AS DIRECTED BY  ANTICOAGULATION CLINIC (Patient taking differently: TAKE ONE TABLET (5 MG) BY MOUTH ON MON AND FRI AND TAKE 0.5 TABLET (2.5 MG) THE REMAINING DAYS OF THE WEEK) 90 tablet 1   No current facility-administered medications on file prior to visit.     Past Medical History:  Diagnosis Date  . Atrial fibrillation (HCC)    Paroxysmal  . Cystitis 1974  . DJD (degenerative joint disease)    Dr Kellie Simmering  . Hyperlipidemia 2005   NMR LIPOPROFILE: LDL 144 (1570/732), HDL 61, TG 103., LDL GOAL = < 130  . Idiopathic pulmonary fibrosis    IDIOPATHIC vs CHRONIC ERD  . Left bundle branch block   . Obstructive sleep apnea    CPAP Intolerant  . Thyroid disease    HYPOTHROIDISM    Past Surgical History:  Procedure Laterality Date  . ABDOMINAL HYSTERECTOMY     Abnormal pap smear; ovaries remaining  . APPENDECTOMY  1966  . CARDIOVERSION N/A 11/07/2012   Procedure: CARDIOVERSION;  Surgeon: Cassell Clement, MD;  Location: [facility] ENDOSCOPY;  Service: Cardiovascular;  Laterality: N/A;  . CHOLECYSTECTOMY  1966  . COLONOSCOPY  2004   negative  . CYSTOSCOPY     Hematuria     Social History   Social History  . Marital status: Married    Spouse name: N/A  . Number of children: 1  . Years of education: N/A   Occupational History  . Retired Retired    [employer]   Social History Main Topics  . Smoking status: Former Smoker    Packs/day: 0.25    Years: 30.00    Types: Cigarettes    Quit date: 06/04/1965  . Smokeless tobacco: Never Used     Comment: smoked 1953-1967, up to < 1/4 ppd  . Alcohol use Yes     Comment: Socially only  . Drug use: No  . Sexual activity: Not on file   Other Topics Concern  . Not on file   Social History Narrative   NO REGULAR EXERCISE   11/03/09 [employer] SIGNED APPOINTING HUSBAND JAMES Vanroekel; OK TO LEAVE MESSAGE ON HOME # [phone]    Family History  Problem Relation Age of  Onset  . Coronary artery disease Father   . Transient ischemic attack Father   . Stroke Father 50  . Asthma Mother   . Cancer Daughter        R breast; S/P mastectomy  . Diabetes Neg Hx     Review of Systems  Constitutional: Positive for appetite change (poor). Negative for chills and fever.  Respiratory: Positive for shortness of breath (exertion). Negative for cough and wheezing.   Cardiovascular: Positive for leg swelling. Negative for chest pain and palpitations.  Gastrointestinal: Negative for abdominal pain and nausea.  Neurological: Negative for dizziness, light-headedness and headaches.       Objective:   Vitals:   01/29/17 1319  BP: (!) 112/56  Pulse: 77  Resp: 20  SpO2: 92%   Wt Readings from Last 3 Encounters:  01/29/17 129 lb (58.5 kg)  12/19/16 130 lb 15.3 oz (59.4 kg)  09/11/16 136 lb (61.7 kg)   Body mass index is 20.2 kg/m.   Physical Exam    Constitutional: chronically ill appear. SOB with talking   HENT:  Head: Normocephalic and atraumatic.  Neck: Neck supple. No tracheal deviation present. No thyromegaly present.  No cervical lymphadenopathy Cardiovascular: Normal rate, regular rhythm and normal heart sounds.   No murmur heard. No carotid bruit .  2 + edema b/l ankes Pulmonary/Chest: mild respiratory distress with SOB after talking.  Good air entry.  Dry crackles b/l bases left > right, occasional wheeze.    Skin: Skin is warm and dry. Not diaphoretic.  Psychiatric: Normal mood and affect. Behavior is normal.      Assessment & Plan:    See Problem List for Assessment and Plan of chronic medical problems.

## 2017-01-29 NOTE — Assessment & Plan Note (Signed)
On prednisone 20 mg daily Has pneumonia at [facility] place Appears to be fluid overloaded which is likely contributing to her SOB - will increase lasix to 40 mg daily Needs to see pulmonary to evaluate SOB from pulmonary standpoint - ? Taper down on steroids or not Will make an appt with pulm  Check cmp, cbc

## 2017-01-29 NOTE — Telephone Encounter (Signed)
LVM giving verbal order per MD.

## 2017-01-29 NOTE — Assessment & Plan Note (Signed)
Related to chronic medical problems, hospitalization Has PT, OT at home - has not started yet

## 2017-01-29 NOTE — Telephone Encounter (Signed)
Needs verbals for for skilled nursing 2week8 2PRN  Pt has a 1x2cm sacrum stage 2 that will be treated as well   Cleaning with normal saline and applying santyl and covering with home dressing.

## 2017-01-29 NOTE — Patient Instructions (Signed)
Pre visit review using our clinic review tool, if applicable. No additional management support is needed unless otherwise documented below in the visit note. 

## 2017-01-29 NOTE — Telephone Encounter (Signed)
Requesting verbals for PT for 2 week 8.  Requesting Wheelchair cushion.  Patient has wheelchair but does not have cushion.  Patient currently has a pressure sore.

## 2017-01-29 NOTE — Assessment & Plan Note (Signed)
Has been stable Will check cmp today Fluid overloaded Increase lasix to 40 mg daily

## 2017-01-29 NOTE — Assessment & Plan Note (Signed)
Appears fluid overloaded - she is SOB and has had increasing leg swelling x few days Increase lasix to 40 mg daily BNP, cmp today

## 2017-01-29 NOTE — Assessment & Plan Note (Signed)
Lab Results  Component Value Date   TSH 3.42 09/12/2015   Continue current dose of medication

## 2017-01-30 ENCOUNTER — Other Ambulatory Visit: Payer: Self-pay | Admitting: Emergency Medicine

## 2017-01-30 DIAGNOSIS — L89152 Pressure ulcer of sacral region, stage 2: Secondary | ICD-10-CM | POA: Diagnosis not present

## 2017-01-30 DIAGNOSIS — E440 Moderate protein-calorie malnutrition: Secondary | ICD-10-CM | POA: Diagnosis not present

## 2017-01-30 DIAGNOSIS — J9621 Acute and chronic respiratory failure with hypoxia: Secondary | ICD-10-CM | POA: Diagnosis not present

## 2017-01-30 DIAGNOSIS — I4891 Unspecified atrial fibrillation: Secondary | ICD-10-CM | POA: Diagnosis not present

## 2017-01-30 DIAGNOSIS — N183 Chronic kidney disease, stage 3 (moderate): Secondary | ICD-10-CM | POA: Diagnosis not present

## 2017-01-30 DIAGNOSIS — G4733 Obstructive sleep apnea (adult) (pediatric): Secondary | ICD-10-CM | POA: Diagnosis not present

## 2017-01-30 DIAGNOSIS — I5023 Acute on chronic systolic (congestive) heart failure: Secondary | ICD-10-CM | POA: Diagnosis not present

## 2017-01-30 DIAGNOSIS — I2729 Other secondary pulmonary hypertension: Secondary | ICD-10-CM | POA: Diagnosis not present

## 2017-01-30 DIAGNOSIS — I5082 Biventricular heart failure: Secondary | ICD-10-CM | POA: Diagnosis not present

## 2017-01-30 DIAGNOSIS — I259 Chronic ischemic heart disease, unspecified: Secondary | ICD-10-CM | POA: Diagnosis not present

## 2017-01-30 DIAGNOSIS — M4120 Other idiopathic scoliosis, site unspecified: Secondary | ICD-10-CM | POA: Diagnosis not present

## 2017-01-30 DIAGNOSIS — I130 Hypertensive heart and chronic kidney disease with heart failure and stage 1 through stage 4 chronic kidney disease, or unspecified chronic kidney disease: Secondary | ICD-10-CM | POA: Diagnosis not present

## 2017-01-30 DIAGNOSIS — J84112 Idiopathic pulmonary fibrosis: Secondary | ICD-10-CM | POA: Diagnosis not present

## 2017-01-30 MED ORDER — FUROSEMIDE 40 MG PO TABS: 40.0000 mg | ORAL_TABLET | Freq: Every day | ORAL | 5 refills | Status: DC

## 2017-01-30 NOTE — Telephone Encounter (Signed)
Faxed to [facility]  

## 2017-01-30 NOTE — Telephone Encounter (Signed)
Verbal orders given for PT per MD. Order for cushion is pending.

## 2017-01-30 NOTE — Progress Notes (Signed)
I have reviewed and agree with the plan. 

## 2017-01-30 NOTE — Telephone Encounter (Signed)
Order signed.

## 2017-01-31 ENCOUNTER — Ambulatory Visit: Payer: Medicare Other | Admitting: Acute Care

## 2017-01-31 DIAGNOSIS — I509 Heart failure, unspecified: Secondary | ICD-10-CM | POA: Diagnosis not present

## 2017-01-31 DIAGNOSIS — J9600 Acute respiratory failure, unspecified whether with hypoxia or hypercapnia: Secondary | ICD-10-CM | POA: Diagnosis not present

## 2017-02-01 ENCOUNTER — Telehealth: Payer: Self-pay | Admitting: Internal Medicine

## 2017-02-01 DIAGNOSIS — I5023 Acute on chronic systolic (congestive) heart failure: Secondary | ICD-10-CM | POA: Diagnosis not present

## 2017-02-01 DIAGNOSIS — G4733 Obstructive sleep apnea (adult) (pediatric): Secondary | ICD-10-CM | POA: Diagnosis not present

## 2017-02-01 DIAGNOSIS — N183 Chronic kidney disease, stage 3 (moderate): Secondary | ICD-10-CM | POA: Diagnosis not present

## 2017-02-01 DIAGNOSIS — I259 Chronic ischemic heart disease, unspecified: Secondary | ICD-10-CM | POA: Diagnosis not present

## 2017-02-01 DIAGNOSIS — I4891 Unspecified atrial fibrillation: Secondary | ICD-10-CM | POA: Diagnosis not present

## 2017-02-01 DIAGNOSIS — J84112 Idiopathic pulmonary fibrosis: Secondary | ICD-10-CM | POA: Diagnosis not present

## 2017-02-01 DIAGNOSIS — M4120 Other idiopathic scoliosis, site unspecified: Secondary | ICD-10-CM | POA: Diagnosis not present

## 2017-02-01 DIAGNOSIS — I130 Hypertensive heart and chronic kidney disease with heart failure and stage 1 through stage 4 chronic kidney disease, or unspecified chronic kidney disease: Secondary | ICD-10-CM | POA: Diagnosis not present

## 2017-02-01 DIAGNOSIS — I5082 Biventricular heart failure: Secondary | ICD-10-CM | POA: Diagnosis not present

## 2017-02-01 DIAGNOSIS — L89152 Pressure ulcer of sacral region, stage 2: Secondary | ICD-10-CM | POA: Diagnosis not present

## 2017-02-01 DIAGNOSIS — E440 Moderate protein-calorie malnutrition: Secondary | ICD-10-CM | POA: Diagnosis not present

## 2017-02-01 DIAGNOSIS — J9621 Acute and chronic respiratory failure with hypoxia: Secondary | ICD-10-CM | POA: Diagnosis not present

## 2017-02-01 DIAGNOSIS — I2729 Other secondary pulmonary hypertension: Secondary | ICD-10-CM | POA: Diagnosis not present

## 2017-02-01 NOTE — Telephone Encounter (Signed)
Notified Charrise Per MD ok for verbal..Was recently seen 01/29/18...Raechel Chute

## 2017-02-01 NOTE — Telephone Encounter (Signed)
charisse Kinderd at home  [phone]  Need verbal to use a pulse oximeter to check his oxygen

## 2017-02-05 ENCOUNTER — Ambulatory Visit: Payer: Medicare Other | Admitting: Internal Medicine

## 2017-02-05 ENCOUNTER — Ambulatory Visit (INDEPENDENT_AMBULATORY_CARE_PROVIDER_SITE_OTHER): Payer: Medicare Other | Admitting: General Practice

## 2017-02-05 DIAGNOSIS — L89152 Pressure ulcer of sacral region, stage 2: Secondary | ICD-10-CM | POA: Diagnosis not present

## 2017-02-05 DIAGNOSIS — J84112 Idiopathic pulmonary fibrosis: Secondary | ICD-10-CM | POA: Diagnosis not present

## 2017-02-05 DIAGNOSIS — I4891 Unspecified atrial fibrillation: Secondary | ICD-10-CM | POA: Diagnosis not present

## 2017-02-05 DIAGNOSIS — G4733 Obstructive sleep apnea (adult) (pediatric): Secondary | ICD-10-CM | POA: Diagnosis not present

## 2017-02-05 DIAGNOSIS — I130 Hypertensive heart and chronic kidney disease with heart failure and stage 1 through stage 4 chronic kidney disease, or unspecified chronic kidney disease: Secondary | ICD-10-CM | POA: Diagnosis not present

## 2017-02-05 DIAGNOSIS — I5023 Acute on chronic systolic (congestive) heart failure: Secondary | ICD-10-CM | POA: Diagnosis not present

## 2017-02-05 DIAGNOSIS — I2729 Other secondary pulmonary hypertension: Secondary | ICD-10-CM | POA: Diagnosis not present

## 2017-02-05 DIAGNOSIS — J9621 Acute and chronic respiratory failure with hypoxia: Secondary | ICD-10-CM | POA: Diagnosis not present

## 2017-02-05 DIAGNOSIS — I5082 Biventricular heart failure: Secondary | ICD-10-CM | POA: Diagnosis not present

## 2017-02-05 DIAGNOSIS — E440 Moderate protein-calorie malnutrition: Secondary | ICD-10-CM | POA: Diagnosis not present

## 2017-02-05 DIAGNOSIS — Z5181 Encounter for therapeutic drug level monitoring: Secondary | ICD-10-CM | POA: Diagnosis not present

## 2017-02-05 DIAGNOSIS — N183 Chronic kidney disease, stage 3 (moderate): Secondary | ICD-10-CM | POA: Diagnosis not present

## 2017-02-05 DIAGNOSIS — I259 Chronic ischemic heart disease, unspecified: Secondary | ICD-10-CM | POA: Diagnosis not present

## 2017-02-05 DIAGNOSIS — M4120 Other idiopathic scoliosis, site unspecified: Secondary | ICD-10-CM | POA: Diagnosis not present

## 2017-02-05 LAB — POCT INR: INR: 4.3

## 2017-02-05 NOTE — Progress Notes (Signed)
I have reviewed and agree with the plan. 

## 2017-02-05 NOTE — Patient Instructions (Signed)
Pre visit review using our clinic review tool, if applicable. No additional management support is needed unless otherwise documented below in the visit note. 

## 2017-02-06 ENCOUNTER — Ambulatory Visit: Payer: Medicare Other | Admitting: Pulmonary Disease

## 2017-02-07 DIAGNOSIS — I259 Chronic ischemic heart disease, unspecified: Secondary | ICD-10-CM | POA: Diagnosis not present

## 2017-02-07 DIAGNOSIS — L89152 Pressure ulcer of sacral region, stage 2: Secondary | ICD-10-CM | POA: Diagnosis not present

## 2017-02-07 DIAGNOSIS — I4891 Unspecified atrial fibrillation: Secondary | ICD-10-CM | POA: Diagnosis not present

## 2017-02-07 DIAGNOSIS — G4733 Obstructive sleep apnea (adult) (pediatric): Secondary | ICD-10-CM | POA: Diagnosis not present

## 2017-02-07 DIAGNOSIS — J84112 Idiopathic pulmonary fibrosis: Secondary | ICD-10-CM | POA: Diagnosis not present

## 2017-02-07 DIAGNOSIS — I2729 Other secondary pulmonary hypertension: Secondary | ICD-10-CM | POA: Diagnosis not present

## 2017-02-07 DIAGNOSIS — I130 Hypertensive heart and chronic kidney disease with heart failure and stage 1 through stage 4 chronic kidney disease, or unspecified chronic kidney disease: Secondary | ICD-10-CM | POA: Diagnosis not present

## 2017-02-07 DIAGNOSIS — I5023 Acute on chronic systolic (congestive) heart failure: Secondary | ICD-10-CM | POA: Diagnosis not present

## 2017-02-07 DIAGNOSIS — E440 Moderate protein-calorie malnutrition: Secondary | ICD-10-CM | POA: Diagnosis not present

## 2017-02-07 DIAGNOSIS — I5082 Biventricular heart failure: Secondary | ICD-10-CM | POA: Diagnosis not present

## 2017-02-07 DIAGNOSIS — N183 Chronic kidney disease, stage 3 (moderate): Secondary | ICD-10-CM | POA: Diagnosis not present

## 2017-02-07 DIAGNOSIS — M4120 Other idiopathic scoliosis, site unspecified: Secondary | ICD-10-CM | POA: Diagnosis not present

## 2017-02-07 DIAGNOSIS — J9621 Acute and chronic respiratory failure with hypoxia: Secondary | ICD-10-CM | POA: Diagnosis not present

## 2017-02-08 DIAGNOSIS — I5023 Acute on chronic systolic (congestive) heart failure: Secondary | ICD-10-CM | POA: Diagnosis not present

## 2017-02-08 DIAGNOSIS — I4891 Unspecified atrial fibrillation: Secondary | ICD-10-CM | POA: Diagnosis not present

## 2017-02-08 DIAGNOSIS — J84112 Idiopathic pulmonary fibrosis: Secondary | ICD-10-CM | POA: Diagnosis not present

## 2017-02-08 DIAGNOSIS — J9621 Acute and chronic respiratory failure with hypoxia: Secondary | ICD-10-CM | POA: Diagnosis not present

## 2017-02-08 DIAGNOSIS — I5082 Biventricular heart failure: Secondary | ICD-10-CM | POA: Diagnosis not present

## 2017-02-08 DIAGNOSIS — I259 Chronic ischemic heart disease, unspecified: Secondary | ICD-10-CM | POA: Diagnosis not present

## 2017-02-08 DIAGNOSIS — L89152 Pressure ulcer of sacral region, stage 2: Secondary | ICD-10-CM | POA: Diagnosis not present

## 2017-02-08 DIAGNOSIS — I130 Hypertensive heart and chronic kidney disease with heart failure and stage 1 through stage 4 chronic kidney disease, or unspecified chronic kidney disease: Secondary | ICD-10-CM | POA: Diagnosis not present

## 2017-02-08 DIAGNOSIS — N183 Chronic kidney disease, stage 3 (moderate): Secondary | ICD-10-CM | POA: Diagnosis not present

## 2017-02-08 DIAGNOSIS — M4120 Other idiopathic scoliosis, site unspecified: Secondary | ICD-10-CM | POA: Diagnosis not present

## 2017-02-08 DIAGNOSIS — E440 Moderate protein-calorie malnutrition: Secondary | ICD-10-CM | POA: Diagnosis not present

## 2017-02-08 DIAGNOSIS — I2729 Other secondary pulmonary hypertension: Secondary | ICD-10-CM | POA: Diagnosis not present

## 2017-02-08 DIAGNOSIS — G4733 Obstructive sleep apnea (adult) (pediatric): Secondary | ICD-10-CM | POA: Diagnosis not present

## 2017-02-11 ENCOUNTER — Ambulatory Visit (INDEPENDENT_AMBULATORY_CARE_PROVIDER_SITE_OTHER): Payer: Medicare Other | Admitting: General Practice

## 2017-02-11 DIAGNOSIS — I5023 Acute on chronic systolic (congestive) heart failure: Secondary | ICD-10-CM | POA: Diagnosis not present

## 2017-02-11 DIAGNOSIS — I259 Chronic ischemic heart disease, unspecified: Secondary | ICD-10-CM | POA: Diagnosis not present

## 2017-02-11 DIAGNOSIS — I4891 Unspecified atrial fibrillation: Secondary | ICD-10-CM

## 2017-02-11 DIAGNOSIS — J84112 Idiopathic pulmonary fibrosis: Secondary | ICD-10-CM | POA: Diagnosis not present

## 2017-02-11 DIAGNOSIS — I2729 Other secondary pulmonary hypertension: Secondary | ICD-10-CM | POA: Diagnosis not present

## 2017-02-11 DIAGNOSIS — E440 Moderate protein-calorie malnutrition: Secondary | ICD-10-CM | POA: Diagnosis not present

## 2017-02-11 DIAGNOSIS — Z5181 Encounter for therapeutic drug level monitoring: Secondary | ICD-10-CM

## 2017-02-11 DIAGNOSIS — L89152 Pressure ulcer of sacral region, stage 2: Secondary | ICD-10-CM | POA: Diagnosis not present

## 2017-02-11 DIAGNOSIS — J9621 Acute and chronic respiratory failure with hypoxia: Secondary | ICD-10-CM | POA: Diagnosis not present

## 2017-02-11 DIAGNOSIS — G4733 Obstructive sleep apnea (adult) (pediatric): Secondary | ICD-10-CM | POA: Diagnosis not present

## 2017-02-11 DIAGNOSIS — M4120 Other idiopathic scoliosis, site unspecified: Secondary | ICD-10-CM | POA: Diagnosis not present

## 2017-02-11 DIAGNOSIS — N183 Chronic kidney disease, stage 3 (moderate): Secondary | ICD-10-CM | POA: Diagnosis not present

## 2017-02-11 DIAGNOSIS — I5082 Biventricular heart failure: Secondary | ICD-10-CM | POA: Diagnosis not present

## 2017-02-11 DIAGNOSIS — I130 Hypertensive heart and chronic kidney disease with heart failure and stage 1 through stage 4 chronic kidney disease, or unspecified chronic kidney disease: Secondary | ICD-10-CM | POA: Diagnosis not present

## 2017-02-11 LAB — POCT INR: INR: 1.5

## 2017-02-11 NOTE — Progress Notes (Signed)
I agree with this plan.

## 2017-02-11 NOTE — Patient Instructions (Signed)
Pre visit review using our clinic review tool, if applicable. No additional management support is needed unless otherwise documented below in the visit note. 

## 2017-02-12 DIAGNOSIS — I259 Chronic ischemic heart disease, unspecified: Secondary | ICD-10-CM | POA: Diagnosis not present

## 2017-02-12 DIAGNOSIS — I5082 Biventricular heart failure: Secondary | ICD-10-CM | POA: Diagnosis not present

## 2017-02-12 DIAGNOSIS — I5023 Acute on chronic systolic (congestive) heart failure: Secondary | ICD-10-CM | POA: Diagnosis not present

## 2017-02-12 DIAGNOSIS — N183 Chronic kidney disease, stage 3 (moderate): Secondary | ICD-10-CM | POA: Diagnosis not present

## 2017-02-12 DIAGNOSIS — I4891 Unspecified atrial fibrillation: Secondary | ICD-10-CM | POA: Diagnosis not present

## 2017-02-12 DIAGNOSIS — E440 Moderate protein-calorie malnutrition: Secondary | ICD-10-CM | POA: Diagnosis not present

## 2017-02-12 DIAGNOSIS — G4733 Obstructive sleep apnea (adult) (pediatric): Secondary | ICD-10-CM | POA: Diagnosis not present

## 2017-02-12 DIAGNOSIS — J9621 Acute and chronic respiratory failure with hypoxia: Secondary | ICD-10-CM | POA: Diagnosis not present

## 2017-02-12 DIAGNOSIS — I130 Hypertensive heart and chronic kidney disease with heart failure and stage 1 through stage 4 chronic kidney disease, or unspecified chronic kidney disease: Secondary | ICD-10-CM | POA: Diagnosis not present

## 2017-02-12 DIAGNOSIS — I2729 Other secondary pulmonary hypertension: Secondary | ICD-10-CM | POA: Diagnosis not present

## 2017-02-12 DIAGNOSIS — J84112 Idiopathic pulmonary fibrosis: Secondary | ICD-10-CM | POA: Diagnosis not present

## 2017-02-12 DIAGNOSIS — M4120 Other idiopathic scoliosis, site unspecified: Secondary | ICD-10-CM | POA: Diagnosis not present

## 2017-02-12 DIAGNOSIS — L89152 Pressure ulcer of sacral region, stage 2: Secondary | ICD-10-CM | POA: Diagnosis not present

## 2017-02-13 ENCOUNTER — Telehealth: Payer: Self-pay | Admitting: Internal Medicine

## 2017-02-13 NOTE — Telephone Encounter (Signed)
Needs verbals for OT for 2week 7

## 2017-02-13 NOTE — Telephone Encounter (Signed)
LVM with verbal orders per MD.  

## 2017-02-14 DIAGNOSIS — J84112 Idiopathic pulmonary fibrosis: Secondary | ICD-10-CM | POA: Diagnosis not present

## 2017-02-14 DIAGNOSIS — I4891 Unspecified atrial fibrillation: Secondary | ICD-10-CM | POA: Diagnosis not present

## 2017-02-14 DIAGNOSIS — N183 Chronic kidney disease, stage 3 (moderate): Secondary | ICD-10-CM | POA: Diagnosis not present

## 2017-02-14 DIAGNOSIS — I130 Hypertensive heart and chronic kidney disease with heart failure and stage 1 through stage 4 chronic kidney disease, or unspecified chronic kidney disease: Secondary | ICD-10-CM | POA: Diagnosis not present

## 2017-02-14 DIAGNOSIS — J9621 Acute and chronic respiratory failure with hypoxia: Secondary | ICD-10-CM | POA: Diagnosis not present

## 2017-02-14 DIAGNOSIS — M4120 Other idiopathic scoliosis, site unspecified: Secondary | ICD-10-CM | POA: Diagnosis not present

## 2017-02-14 DIAGNOSIS — G4733 Obstructive sleep apnea (adult) (pediatric): Secondary | ICD-10-CM | POA: Diagnosis not present

## 2017-02-14 DIAGNOSIS — I5082 Biventricular heart failure: Secondary | ICD-10-CM | POA: Diagnosis not present

## 2017-02-14 DIAGNOSIS — I2729 Other secondary pulmonary hypertension: Secondary | ICD-10-CM | POA: Diagnosis not present

## 2017-02-14 DIAGNOSIS — I5023 Acute on chronic systolic (congestive) heart failure: Secondary | ICD-10-CM | POA: Diagnosis not present

## 2017-02-14 DIAGNOSIS — I259 Chronic ischemic heart disease, unspecified: Secondary | ICD-10-CM | POA: Diagnosis not present

## 2017-02-14 DIAGNOSIS — J9611 Chronic respiratory failure with hypoxia: Secondary | ICD-10-CM | POA: Diagnosis not present

## 2017-02-14 DIAGNOSIS — E440 Moderate protein-calorie malnutrition: Secondary | ICD-10-CM | POA: Diagnosis not present

## 2017-02-14 DIAGNOSIS — L89152 Pressure ulcer of sacral region, stage 2: Secondary | ICD-10-CM | POA: Diagnosis not present

## 2017-02-18 DIAGNOSIS — L89152 Pressure ulcer of sacral region, stage 2: Secondary | ICD-10-CM | POA: Diagnosis not present

## 2017-02-18 DIAGNOSIS — I2729 Other secondary pulmonary hypertension: Secondary | ICD-10-CM | POA: Diagnosis not present

## 2017-02-18 DIAGNOSIS — I130 Hypertensive heart and chronic kidney disease with heart failure and stage 1 through stage 4 chronic kidney disease, or unspecified chronic kidney disease: Secondary | ICD-10-CM | POA: Diagnosis not present

## 2017-02-18 DIAGNOSIS — J9621 Acute and chronic respiratory failure with hypoxia: Secondary | ICD-10-CM | POA: Diagnosis not present

## 2017-02-18 DIAGNOSIS — I5082 Biventricular heart failure: Secondary | ICD-10-CM | POA: Diagnosis not present

## 2017-02-18 DIAGNOSIS — E440 Moderate protein-calorie malnutrition: Secondary | ICD-10-CM | POA: Diagnosis not present

## 2017-02-18 DIAGNOSIS — N183 Chronic kidney disease, stage 3 (moderate): Secondary | ICD-10-CM | POA: Diagnosis not present

## 2017-02-18 DIAGNOSIS — J84112 Idiopathic pulmonary fibrosis: Secondary | ICD-10-CM | POA: Diagnosis not present

## 2017-02-18 DIAGNOSIS — I4891 Unspecified atrial fibrillation: Secondary | ICD-10-CM | POA: Diagnosis not present

## 2017-02-18 DIAGNOSIS — I5023 Acute on chronic systolic (congestive) heart failure: Secondary | ICD-10-CM | POA: Diagnosis not present

## 2017-02-18 DIAGNOSIS — I259 Chronic ischemic heart disease, unspecified: Secondary | ICD-10-CM | POA: Diagnosis not present

## 2017-02-18 DIAGNOSIS — G4733 Obstructive sleep apnea (adult) (pediatric): Secondary | ICD-10-CM | POA: Diagnosis not present

## 2017-02-18 DIAGNOSIS — M4120 Other idiopathic scoliosis, site unspecified: Secondary | ICD-10-CM | POA: Diagnosis not present

## 2017-02-18 LAB — POCT INR: INR: 3.6

## 2017-02-19 ENCOUNTER — Ambulatory Visit (INDEPENDENT_AMBULATORY_CARE_PROVIDER_SITE_OTHER): Payer: Medicare Other | Admitting: General Practice

## 2017-02-19 DIAGNOSIS — Z7901 Long term (current) use of anticoagulants: Secondary | ICD-10-CM | POA: Insufficient documentation

## 2017-02-19 DIAGNOSIS — J84112 Idiopathic pulmonary fibrosis: Secondary | ICD-10-CM | POA: Diagnosis not present

## 2017-02-19 DIAGNOSIS — I4891 Unspecified atrial fibrillation: Secondary | ICD-10-CM | POA: Diagnosis not present

## 2017-02-19 DIAGNOSIS — I259 Chronic ischemic heart disease, unspecified: Secondary | ICD-10-CM | POA: Diagnosis not present

## 2017-02-19 DIAGNOSIS — I5023 Acute on chronic systolic (congestive) heart failure: Secondary | ICD-10-CM | POA: Diagnosis not present

## 2017-02-19 DIAGNOSIS — I130 Hypertensive heart and chronic kidney disease with heart failure and stage 1 through stage 4 chronic kidney disease, or unspecified chronic kidney disease: Secondary | ICD-10-CM | POA: Diagnosis not present

## 2017-02-19 DIAGNOSIS — G4733 Obstructive sleep apnea (adult) (pediatric): Secondary | ICD-10-CM | POA: Diagnosis not present

## 2017-02-19 DIAGNOSIS — M4120 Other idiopathic scoliosis, site unspecified: Secondary | ICD-10-CM | POA: Diagnosis not present

## 2017-02-19 DIAGNOSIS — J9621 Acute and chronic respiratory failure with hypoxia: Secondary | ICD-10-CM | POA: Diagnosis not present

## 2017-02-19 DIAGNOSIS — N183 Chronic kidney disease, stage 3 (moderate): Secondary | ICD-10-CM | POA: Diagnosis not present

## 2017-02-19 DIAGNOSIS — E440 Moderate protein-calorie malnutrition: Secondary | ICD-10-CM | POA: Diagnosis not present

## 2017-02-19 DIAGNOSIS — Z5181 Encounter for therapeutic drug level monitoring: Secondary | ICD-10-CM

## 2017-02-19 DIAGNOSIS — I5082 Biventricular heart failure: Secondary | ICD-10-CM | POA: Diagnosis not present

## 2017-02-19 DIAGNOSIS — I2729 Other secondary pulmonary hypertension: Secondary | ICD-10-CM | POA: Diagnosis not present

## 2017-02-19 DIAGNOSIS — L89152 Pressure ulcer of sacral region, stage 2: Secondary | ICD-10-CM | POA: Diagnosis not present

## 2017-02-19 NOTE — Patient Instructions (Signed)
Pre visit review using our clinic review tool, if applicable. No additional management support is needed unless otherwise documented below in the visit note. 

## 2017-02-20 DIAGNOSIS — I259 Chronic ischemic heart disease, unspecified: Secondary | ICD-10-CM | POA: Diagnosis not present

## 2017-02-20 DIAGNOSIS — N183 Chronic kidney disease, stage 3 (moderate): Secondary | ICD-10-CM | POA: Diagnosis not present

## 2017-02-20 DIAGNOSIS — J9621 Acute and chronic respiratory failure with hypoxia: Secondary | ICD-10-CM | POA: Diagnosis not present

## 2017-02-20 DIAGNOSIS — E440 Moderate protein-calorie malnutrition: Secondary | ICD-10-CM | POA: Diagnosis not present

## 2017-02-20 DIAGNOSIS — G4733 Obstructive sleep apnea (adult) (pediatric): Secondary | ICD-10-CM | POA: Diagnosis not present

## 2017-02-20 DIAGNOSIS — I130 Hypertensive heart and chronic kidney disease with heart failure and stage 1 through stage 4 chronic kidney disease, or unspecified chronic kidney disease: Secondary | ICD-10-CM | POA: Diagnosis not present

## 2017-02-20 DIAGNOSIS — I2729 Other secondary pulmonary hypertension: Secondary | ICD-10-CM | POA: Diagnosis not present

## 2017-02-20 DIAGNOSIS — L89152 Pressure ulcer of sacral region, stage 2: Secondary | ICD-10-CM | POA: Diagnosis not present

## 2017-02-20 DIAGNOSIS — I4891 Unspecified atrial fibrillation: Secondary | ICD-10-CM | POA: Diagnosis not present

## 2017-02-20 DIAGNOSIS — I5082 Biventricular heart failure: Secondary | ICD-10-CM | POA: Diagnosis not present

## 2017-02-20 DIAGNOSIS — J84112 Idiopathic pulmonary fibrosis: Secondary | ICD-10-CM | POA: Diagnosis not present

## 2017-02-20 DIAGNOSIS — M4120 Other idiopathic scoliosis, site unspecified: Secondary | ICD-10-CM | POA: Diagnosis not present

## 2017-02-20 DIAGNOSIS — I5023 Acute on chronic systolic (congestive) heart failure: Secondary | ICD-10-CM | POA: Diagnosis not present

## 2017-02-21 DIAGNOSIS — I5082 Biventricular heart failure: Secondary | ICD-10-CM | POA: Diagnosis not present

## 2017-02-21 DIAGNOSIS — J9621 Acute and chronic respiratory failure with hypoxia: Secondary | ICD-10-CM | POA: Diagnosis not present

## 2017-02-21 DIAGNOSIS — J84112 Idiopathic pulmonary fibrosis: Secondary | ICD-10-CM | POA: Diagnosis not present

## 2017-02-21 DIAGNOSIS — I259 Chronic ischemic heart disease, unspecified: Secondary | ICD-10-CM | POA: Diagnosis not present

## 2017-02-21 DIAGNOSIS — M4120 Other idiopathic scoliosis, site unspecified: Secondary | ICD-10-CM | POA: Diagnosis not present

## 2017-02-21 DIAGNOSIS — I5023 Acute on chronic systolic (congestive) heart failure: Secondary | ICD-10-CM | POA: Diagnosis not present

## 2017-02-21 DIAGNOSIS — I130 Hypertensive heart and chronic kidney disease with heart failure and stage 1 through stage 4 chronic kidney disease, or unspecified chronic kidney disease: Secondary | ICD-10-CM | POA: Diagnosis not present

## 2017-02-21 DIAGNOSIS — E440 Moderate protein-calorie malnutrition: Secondary | ICD-10-CM | POA: Diagnosis not present

## 2017-02-21 DIAGNOSIS — N183 Chronic kidney disease, stage 3 (moderate): Secondary | ICD-10-CM | POA: Diagnosis not present

## 2017-02-21 DIAGNOSIS — I2729 Other secondary pulmonary hypertension: Secondary | ICD-10-CM | POA: Diagnosis not present

## 2017-02-21 DIAGNOSIS — L89152 Pressure ulcer of sacral region, stage 2: Secondary | ICD-10-CM | POA: Diagnosis not present

## 2017-02-21 DIAGNOSIS — I4891 Unspecified atrial fibrillation: Secondary | ICD-10-CM | POA: Diagnosis not present

## 2017-02-21 DIAGNOSIS — G4733 Obstructive sleep apnea (adult) (pediatric): Secondary | ICD-10-CM | POA: Diagnosis not present

## 2017-02-22 DIAGNOSIS — E440 Moderate protein-calorie malnutrition: Secondary | ICD-10-CM | POA: Diagnosis not present

## 2017-02-22 DIAGNOSIS — G4733 Obstructive sleep apnea (adult) (pediatric): Secondary | ICD-10-CM | POA: Diagnosis not present

## 2017-02-22 DIAGNOSIS — L89152 Pressure ulcer of sacral region, stage 2: Secondary | ICD-10-CM | POA: Diagnosis not present

## 2017-02-22 DIAGNOSIS — J84112 Idiopathic pulmonary fibrosis: Secondary | ICD-10-CM | POA: Diagnosis not present

## 2017-02-22 DIAGNOSIS — I2729 Other secondary pulmonary hypertension: Secondary | ICD-10-CM | POA: Diagnosis not present

## 2017-02-22 DIAGNOSIS — M4120 Other idiopathic scoliosis, site unspecified: Secondary | ICD-10-CM | POA: Diagnosis not present

## 2017-02-22 DIAGNOSIS — I5023 Acute on chronic systolic (congestive) heart failure: Secondary | ICD-10-CM | POA: Diagnosis not present

## 2017-02-22 DIAGNOSIS — I130 Hypertensive heart and chronic kidney disease with heart failure and stage 1 through stage 4 chronic kidney disease, or unspecified chronic kidney disease: Secondary | ICD-10-CM | POA: Diagnosis not present

## 2017-02-22 DIAGNOSIS — N183 Chronic kidney disease, stage 3 (moderate): Secondary | ICD-10-CM | POA: Diagnosis not present

## 2017-02-22 DIAGNOSIS — I259 Chronic ischemic heart disease, unspecified: Secondary | ICD-10-CM | POA: Diagnosis not present

## 2017-02-22 DIAGNOSIS — I4891 Unspecified atrial fibrillation: Secondary | ICD-10-CM | POA: Diagnosis not present

## 2017-02-22 DIAGNOSIS — J9621 Acute and chronic respiratory failure with hypoxia: Secondary | ICD-10-CM | POA: Diagnosis not present

## 2017-02-22 DIAGNOSIS — I5082 Biventricular heart failure: Secondary | ICD-10-CM | POA: Diagnosis not present

## 2017-02-23 NOTE — Progress Notes (Signed)
Agree with INR management  Justice Milliron J Elowyn Raupp, MD  

## 2017-02-25 DIAGNOSIS — I4891 Unspecified atrial fibrillation: Secondary | ICD-10-CM | POA: Diagnosis not present

## 2017-02-25 DIAGNOSIS — I130 Hypertensive heart and chronic kidney disease with heart failure and stage 1 through stage 4 chronic kidney disease, or unspecified chronic kidney disease: Secondary | ICD-10-CM | POA: Diagnosis not present

## 2017-02-25 DIAGNOSIS — J84112 Idiopathic pulmonary fibrosis: Secondary | ICD-10-CM | POA: Diagnosis not present

## 2017-02-25 DIAGNOSIS — I259 Chronic ischemic heart disease, unspecified: Secondary | ICD-10-CM | POA: Diagnosis not present

## 2017-02-25 DIAGNOSIS — I5023 Acute on chronic systolic (congestive) heart failure: Secondary | ICD-10-CM | POA: Diagnosis not present

## 2017-02-25 DIAGNOSIS — G4733 Obstructive sleep apnea (adult) (pediatric): Secondary | ICD-10-CM | POA: Diagnosis not present

## 2017-02-25 DIAGNOSIS — M4120 Other idiopathic scoliosis, site unspecified: Secondary | ICD-10-CM | POA: Diagnosis not present

## 2017-02-25 DIAGNOSIS — E440 Moderate protein-calorie malnutrition: Secondary | ICD-10-CM | POA: Diagnosis not present

## 2017-02-25 DIAGNOSIS — I2729 Other secondary pulmonary hypertension: Secondary | ICD-10-CM | POA: Diagnosis not present

## 2017-02-25 DIAGNOSIS — L89152 Pressure ulcer of sacral region, stage 2: Secondary | ICD-10-CM | POA: Diagnosis not present

## 2017-02-25 DIAGNOSIS — N183 Chronic kidney disease, stage 3 (moderate): Secondary | ICD-10-CM | POA: Diagnosis not present

## 2017-02-25 DIAGNOSIS — I5082 Biventricular heart failure: Secondary | ICD-10-CM | POA: Diagnosis not present

## 2017-02-25 DIAGNOSIS — J9621 Acute and chronic respiratory failure with hypoxia: Secondary | ICD-10-CM | POA: Diagnosis not present

## 2017-02-26 DIAGNOSIS — I2729 Other secondary pulmonary hypertension: Secondary | ICD-10-CM | POA: Diagnosis not present

## 2017-02-26 DIAGNOSIS — M4120 Other idiopathic scoliosis, site unspecified: Secondary | ICD-10-CM | POA: Diagnosis not present

## 2017-02-26 DIAGNOSIS — I130 Hypertensive heart and chronic kidney disease with heart failure and stage 1 through stage 4 chronic kidney disease, or unspecified chronic kidney disease: Secondary | ICD-10-CM | POA: Diagnosis not present

## 2017-02-26 DIAGNOSIS — N183 Chronic kidney disease, stage 3 (moderate): Secondary | ICD-10-CM | POA: Diagnosis not present

## 2017-02-26 DIAGNOSIS — I5082 Biventricular heart failure: Secondary | ICD-10-CM | POA: Diagnosis not present

## 2017-02-26 DIAGNOSIS — J84112 Idiopathic pulmonary fibrosis: Secondary | ICD-10-CM | POA: Diagnosis not present

## 2017-02-26 DIAGNOSIS — G4733 Obstructive sleep apnea (adult) (pediatric): Secondary | ICD-10-CM | POA: Diagnosis not present

## 2017-02-26 DIAGNOSIS — J9621 Acute and chronic respiratory failure with hypoxia: Secondary | ICD-10-CM | POA: Diagnosis not present

## 2017-02-26 DIAGNOSIS — I4891 Unspecified atrial fibrillation: Secondary | ICD-10-CM | POA: Diagnosis not present

## 2017-02-26 DIAGNOSIS — L89152 Pressure ulcer of sacral region, stage 2: Secondary | ICD-10-CM | POA: Diagnosis not present

## 2017-02-26 DIAGNOSIS — I259 Chronic ischemic heart disease, unspecified: Secondary | ICD-10-CM | POA: Diagnosis not present

## 2017-02-26 DIAGNOSIS — I5023 Acute on chronic systolic (congestive) heart failure: Secondary | ICD-10-CM | POA: Diagnosis not present

## 2017-02-26 DIAGNOSIS — E440 Moderate protein-calorie malnutrition: Secondary | ICD-10-CM | POA: Diagnosis not present

## 2017-02-27 DIAGNOSIS — I5023 Acute on chronic systolic (congestive) heart failure: Secondary | ICD-10-CM | POA: Diagnosis not present

## 2017-02-27 DIAGNOSIS — I130 Hypertensive heart and chronic kidney disease with heart failure and stage 1 through stage 4 chronic kidney disease, or unspecified chronic kidney disease: Secondary | ICD-10-CM | POA: Diagnosis not present

## 2017-02-27 DIAGNOSIS — M4120 Other idiopathic scoliosis, site unspecified: Secondary | ICD-10-CM | POA: Diagnosis not present

## 2017-02-27 DIAGNOSIS — E440 Moderate protein-calorie malnutrition: Secondary | ICD-10-CM | POA: Diagnosis not present

## 2017-02-27 DIAGNOSIS — I2729 Other secondary pulmonary hypertension: Secondary | ICD-10-CM | POA: Diagnosis not present

## 2017-02-27 DIAGNOSIS — J9621 Acute and chronic respiratory failure with hypoxia: Secondary | ICD-10-CM | POA: Diagnosis not present

## 2017-02-27 DIAGNOSIS — I5082 Biventricular heart failure: Secondary | ICD-10-CM | POA: Diagnosis not present

## 2017-02-27 DIAGNOSIS — G4733 Obstructive sleep apnea (adult) (pediatric): Secondary | ICD-10-CM | POA: Diagnosis not present

## 2017-02-27 DIAGNOSIS — I4891 Unspecified atrial fibrillation: Secondary | ICD-10-CM | POA: Diagnosis not present

## 2017-02-27 DIAGNOSIS — L89152 Pressure ulcer of sacral region, stage 2: Secondary | ICD-10-CM | POA: Diagnosis not present

## 2017-02-27 DIAGNOSIS — I259 Chronic ischemic heart disease, unspecified: Secondary | ICD-10-CM | POA: Diagnosis not present

## 2017-02-27 DIAGNOSIS — N183 Chronic kidney disease, stage 3 (moderate): Secondary | ICD-10-CM | POA: Diagnosis not present

## 2017-02-27 DIAGNOSIS — J84112 Idiopathic pulmonary fibrosis: Secondary | ICD-10-CM | POA: Diagnosis not present

## 2017-02-28 ENCOUNTER — Ambulatory Visit (INDEPENDENT_AMBULATORY_CARE_PROVIDER_SITE_OTHER): Payer: Medicare Other | Admitting: General Practice

## 2017-02-28 DIAGNOSIS — I5082 Biventricular heart failure: Secondary | ICD-10-CM | POA: Diagnosis not present

## 2017-02-28 DIAGNOSIS — Z7901 Long term (current) use of anticoagulants: Secondary | ICD-10-CM | POA: Diagnosis not present

## 2017-02-28 DIAGNOSIS — I5023 Acute on chronic systolic (congestive) heart failure: Secondary | ICD-10-CM | POA: Diagnosis not present

## 2017-02-28 DIAGNOSIS — M4120 Other idiopathic scoliosis, site unspecified: Secondary | ICD-10-CM | POA: Diagnosis not present

## 2017-02-28 DIAGNOSIS — J9621 Acute and chronic respiratory failure with hypoxia: Secondary | ICD-10-CM | POA: Diagnosis not present

## 2017-02-28 DIAGNOSIS — G4733 Obstructive sleep apnea (adult) (pediatric): Secondary | ICD-10-CM | POA: Diagnosis not present

## 2017-02-28 DIAGNOSIS — L89152 Pressure ulcer of sacral region, stage 2: Secondary | ICD-10-CM | POA: Diagnosis not present

## 2017-02-28 DIAGNOSIS — I2729 Other secondary pulmonary hypertension: Secondary | ICD-10-CM | POA: Diagnosis not present

## 2017-02-28 DIAGNOSIS — N183 Chronic kidney disease, stage 3 (moderate): Secondary | ICD-10-CM | POA: Diagnosis not present

## 2017-02-28 DIAGNOSIS — I4891 Unspecified atrial fibrillation: Secondary | ICD-10-CM

## 2017-02-28 DIAGNOSIS — J84112 Idiopathic pulmonary fibrosis: Secondary | ICD-10-CM | POA: Diagnosis not present

## 2017-02-28 DIAGNOSIS — I130 Hypertensive heart and chronic kidney disease with heart failure and stage 1 through stage 4 chronic kidney disease, or unspecified chronic kidney disease: Secondary | ICD-10-CM | POA: Diagnosis not present

## 2017-02-28 DIAGNOSIS — I259 Chronic ischemic heart disease, unspecified: Secondary | ICD-10-CM | POA: Diagnosis not present

## 2017-02-28 DIAGNOSIS — E440 Moderate protein-calorie malnutrition: Secondary | ICD-10-CM | POA: Diagnosis not present

## 2017-02-28 LAB — POCT INR: INR: 8

## 2017-02-28 NOTE — Patient Instructions (Addendum)
Pre visit review using our clinic review tool, if applicable. No additional management support is needed unless otherwise documented below in the visit note.   Jill Alexanders, RN to have patient hold coumadin through Sunday, 9/30 and to check INR on Monday 10/1.  I gave Kriste Basque, RN the Dr. Glennon Hamilton number [phone]) to report INR to Marcos Eke, NP or Dr. Lawerance Bach.

## 2017-03-01 DIAGNOSIS — I259 Chronic ischemic heart disease, unspecified: Secondary | ICD-10-CM | POA: Diagnosis not present

## 2017-03-01 DIAGNOSIS — E440 Moderate protein-calorie malnutrition: Secondary | ICD-10-CM | POA: Diagnosis not present

## 2017-03-01 DIAGNOSIS — J9621 Acute and chronic respiratory failure with hypoxia: Secondary | ICD-10-CM | POA: Diagnosis not present

## 2017-03-01 DIAGNOSIS — L89152 Pressure ulcer of sacral region, stage 2: Secondary | ICD-10-CM | POA: Diagnosis not present

## 2017-03-01 DIAGNOSIS — M4120 Other idiopathic scoliosis, site unspecified: Secondary | ICD-10-CM | POA: Diagnosis not present

## 2017-03-01 DIAGNOSIS — N183 Chronic kidney disease, stage 3 (moderate): Secondary | ICD-10-CM | POA: Diagnosis not present

## 2017-03-01 DIAGNOSIS — I130 Hypertensive heart and chronic kidney disease with heart failure and stage 1 through stage 4 chronic kidney disease, or unspecified chronic kidney disease: Secondary | ICD-10-CM | POA: Diagnosis not present

## 2017-03-01 DIAGNOSIS — I2729 Other secondary pulmonary hypertension: Secondary | ICD-10-CM | POA: Diagnosis not present

## 2017-03-01 DIAGNOSIS — I4891 Unspecified atrial fibrillation: Secondary | ICD-10-CM | POA: Diagnosis not present

## 2017-03-01 DIAGNOSIS — I5023 Acute on chronic systolic (congestive) heart failure: Secondary | ICD-10-CM | POA: Diagnosis not present

## 2017-03-01 DIAGNOSIS — I5082 Biventricular heart failure: Secondary | ICD-10-CM | POA: Diagnosis not present

## 2017-03-01 DIAGNOSIS — J84112 Idiopathic pulmonary fibrosis: Secondary | ICD-10-CM | POA: Diagnosis not present

## 2017-03-01 DIAGNOSIS — G4733 Obstructive sleep apnea (adult) (pediatric): Secondary | ICD-10-CM | POA: Diagnosis not present

## 2017-03-01 NOTE — Progress Notes (Signed)
I have reviewed and agree with the plan. 

## 2017-03-03 DIAGNOSIS — J9600 Acute respiratory failure, unspecified whether with hypoxia or hypercapnia: Secondary | ICD-10-CM | POA: Diagnosis not present

## 2017-03-03 DIAGNOSIS — I509 Heart failure, unspecified: Secondary | ICD-10-CM | POA: Diagnosis not present

## 2017-03-04 ENCOUNTER — Telehealth: Payer: Self-pay | Admitting: Internal Medicine

## 2017-03-04 DIAGNOSIS — N183 Chronic kidney disease, stage 3 (moderate): Secondary | ICD-10-CM | POA: Diagnosis not present

## 2017-03-04 DIAGNOSIS — J84112 Idiopathic pulmonary fibrosis: Secondary | ICD-10-CM | POA: Diagnosis not present

## 2017-03-04 DIAGNOSIS — J9621 Acute and chronic respiratory failure with hypoxia: Secondary | ICD-10-CM | POA: Diagnosis not present

## 2017-03-04 DIAGNOSIS — I4891 Unspecified atrial fibrillation: Secondary | ICD-10-CM | POA: Diagnosis not present

## 2017-03-04 DIAGNOSIS — I2729 Other secondary pulmonary hypertension: Secondary | ICD-10-CM | POA: Diagnosis not present

## 2017-03-04 DIAGNOSIS — I130 Hypertensive heart and chronic kidney disease with heart failure and stage 1 through stage 4 chronic kidney disease, or unspecified chronic kidney disease: Secondary | ICD-10-CM | POA: Diagnosis not present

## 2017-03-04 DIAGNOSIS — I5023 Acute on chronic systolic (congestive) heart failure: Secondary | ICD-10-CM | POA: Diagnosis not present

## 2017-03-04 DIAGNOSIS — M4120 Other idiopathic scoliosis, site unspecified: Secondary | ICD-10-CM | POA: Diagnosis not present

## 2017-03-04 DIAGNOSIS — I5082 Biventricular heart failure: Secondary | ICD-10-CM | POA: Diagnosis not present

## 2017-03-04 DIAGNOSIS — L89152 Pressure ulcer of sacral region, stage 2: Secondary | ICD-10-CM | POA: Diagnosis not present

## 2017-03-04 DIAGNOSIS — I259 Chronic ischemic heart disease, unspecified: Secondary | ICD-10-CM | POA: Diagnosis not present

## 2017-03-04 DIAGNOSIS — E440 Moderate protein-calorie malnutrition: Secondary | ICD-10-CM | POA: Diagnosis not present

## 2017-03-04 DIAGNOSIS — G4733 Obstructive sleep apnea (adult) (pediatric): Secondary | ICD-10-CM | POA: Diagnosis not present

## 2017-03-04 NOTE — Telephone Encounter (Signed)
[name] with [facility] at home said pts INR 2.1  Beck number [phone]

## 2017-03-04 NOTE — Telephone Encounter (Signed)
Arline Asp is out of the office this week. Please advise.

## 2017-03-05 ENCOUNTER — Ambulatory Visit: Payer: Medicare Other | Admitting: Internal Medicine

## 2017-03-05 DIAGNOSIS — I5023 Acute on chronic systolic (congestive) heart failure: Secondary | ICD-10-CM | POA: Diagnosis not present

## 2017-03-05 DIAGNOSIS — E440 Moderate protein-calorie malnutrition: Secondary | ICD-10-CM | POA: Diagnosis not present

## 2017-03-05 DIAGNOSIS — J84112 Idiopathic pulmonary fibrosis: Secondary | ICD-10-CM | POA: Diagnosis not present

## 2017-03-05 DIAGNOSIS — I5082 Biventricular heart failure: Secondary | ICD-10-CM | POA: Diagnosis not present

## 2017-03-05 DIAGNOSIS — G4733 Obstructive sleep apnea (adult) (pediatric): Secondary | ICD-10-CM | POA: Diagnosis not present

## 2017-03-05 DIAGNOSIS — L89152 Pressure ulcer of sacral region, stage 2: Secondary | ICD-10-CM | POA: Diagnosis not present

## 2017-03-05 DIAGNOSIS — N183 Chronic kidney disease, stage 3 (moderate): Secondary | ICD-10-CM | POA: Diagnosis not present

## 2017-03-05 DIAGNOSIS — I130 Hypertensive heart and chronic kidney disease with heart failure and stage 1 through stage 4 chronic kidney disease, or unspecified chronic kidney disease: Secondary | ICD-10-CM | POA: Diagnosis not present

## 2017-03-05 DIAGNOSIS — I259 Chronic ischemic heart disease, unspecified: Secondary | ICD-10-CM | POA: Diagnosis not present

## 2017-03-05 DIAGNOSIS — M4120 Other idiopathic scoliosis, site unspecified: Secondary | ICD-10-CM | POA: Diagnosis not present

## 2017-03-05 DIAGNOSIS — I2729 Other secondary pulmonary hypertension: Secondary | ICD-10-CM | POA: Diagnosis not present

## 2017-03-05 DIAGNOSIS — J9621 Acute and chronic respiratory failure with hypoxia: Secondary | ICD-10-CM | POA: Diagnosis not present

## 2017-03-05 DIAGNOSIS — I4891 Unspecified atrial fibrillation: Secondary | ICD-10-CM | POA: Diagnosis not present

## 2017-03-06 ENCOUNTER — Emergency Department (HOSPITAL_COMMUNITY): Payer: Medicare Other

## 2017-03-06 ENCOUNTER — Inpatient Hospital Stay (HOSPITAL_COMMUNITY)
Admission: EM | Admit: 2017-03-06 | Discharge: 2017-03-12 | DRG: 291 | Disposition: A | Discharge status: Hospice | Payer: Medicare Other | Attending: Internal Medicine | Admitting: Internal Medicine

## 2017-03-06 ENCOUNTER — Encounter (HOSPITAL_COMMUNITY): Payer: Self-pay

## 2017-03-06 DIAGNOSIS — M5489 Other dorsalgia: Secondary | ICD-10-CM | POA: Diagnosis not present

## 2017-03-06 DIAGNOSIS — M545 Low back pain: Secondary | ICD-10-CM | POA: Diagnosis present

## 2017-03-06 DIAGNOSIS — R739 Hyperglycemia, unspecified: Secondary | ICD-10-CM | POA: Diagnosis not present

## 2017-03-06 DIAGNOSIS — I5082 Biventricular heart failure: Secondary | ICD-10-CM | POA: Diagnosis not present

## 2017-03-06 DIAGNOSIS — Z90710 Acquired absence of both cervix and uterus: Secondary | ICD-10-CM

## 2017-03-06 DIAGNOSIS — Z7901 Long term (current) use of anticoagulants: Secondary | ICD-10-CM | POA: Diagnosis not present

## 2017-03-06 DIAGNOSIS — N179 Acute kidney failure, unspecified: Secondary | ICD-10-CM | POA: Diagnosis not present

## 2017-03-06 DIAGNOSIS — E440 Moderate protein-calorie malnutrition: Secondary | ICD-10-CM | POA: Diagnosis not present

## 2017-03-06 DIAGNOSIS — Z6820 Body mass index (BMI) 20.0-20.9, adult: Secondary | ICD-10-CM

## 2017-03-06 DIAGNOSIS — Z7189 Other specified counseling: Secondary | ICD-10-CM | POA: Diagnosis not present

## 2017-03-06 DIAGNOSIS — R778 Other specified abnormalities of plasma proteins: Secondary | ICD-10-CM | POA: Diagnosis present

## 2017-03-06 DIAGNOSIS — R0989 Other specified symptoms and signs involving the circulatory and respiratory systems: Secondary | ICD-10-CM | POA: Diagnosis not present

## 2017-03-06 DIAGNOSIS — I259 Chronic ischemic heart disease, unspecified: Secondary | ICD-10-CM | POA: Diagnosis not present

## 2017-03-06 DIAGNOSIS — Z8249 Family history of ischemic heart disease and other diseases of the circulatory system: Secondary | ICD-10-CM

## 2017-03-06 DIAGNOSIS — Z881 Allergy status to other antibiotic agents status: Secondary | ICD-10-CM | POA: Diagnosis not present

## 2017-03-06 DIAGNOSIS — N183 Chronic kidney disease, stage 3 unspecified: Secondary | ICD-10-CM | POA: Diagnosis present

## 2017-03-06 DIAGNOSIS — J9621 Acute and chronic respiratory failure with hypoxia: Secondary | ICD-10-CM | POA: Diagnosis not present

## 2017-03-06 DIAGNOSIS — Z803 Family history of malignant neoplasm of breast: Secondary | ICD-10-CM

## 2017-03-06 DIAGNOSIS — I2729 Other secondary pulmonary hypertension: Secondary | ICD-10-CM | POA: Diagnosis not present

## 2017-03-06 DIAGNOSIS — Y92230 Patient room in hospital as the place of occurrence of the external cause: Secondary | ICD-10-CM | POA: Diagnosis present

## 2017-03-06 DIAGNOSIS — Z7952 Long term (current) use of systemic steroids: Secondary | ICD-10-CM

## 2017-03-06 DIAGNOSIS — Z9049 Acquired absence of other specified parts of digestive tract: Secondary | ICD-10-CM | POA: Diagnosis not present

## 2017-03-06 DIAGNOSIS — L89152 Pressure ulcer of sacral region, stage 2: Secondary | ICD-10-CM | POA: Diagnosis not present

## 2017-03-06 DIAGNOSIS — I4891 Unspecified atrial fibrillation: Secondary | ICD-10-CM | POA: Diagnosis present

## 2017-03-06 DIAGNOSIS — J84112 Idiopathic pulmonary fibrosis: Secondary | ICD-10-CM | POA: Diagnosis present

## 2017-03-06 DIAGNOSIS — M4120 Other idiopathic scoliosis, site unspecified: Secondary | ICD-10-CM | POA: Diagnosis not present

## 2017-03-06 DIAGNOSIS — Z9981 Dependence on supplemental oxygen: Secondary | ICD-10-CM

## 2017-03-06 DIAGNOSIS — E43 Unspecified severe protein-calorie malnutrition: Secondary | ICD-10-CM | POA: Diagnosis not present

## 2017-03-06 DIAGNOSIS — Z888 Allergy status to other drugs, medicaments and biological substances status: Secondary | ICD-10-CM

## 2017-03-06 DIAGNOSIS — I130 Hypertensive heart and chronic kidney disease with heart failure and stage 1 through stage 4 chronic kidney disease, or unspecified chronic kidney disease: Secondary | ICD-10-CM | POA: Diagnosis not present

## 2017-03-06 DIAGNOSIS — I482 Chronic atrial fibrillation: Secondary | ICD-10-CM | POA: Diagnosis not present

## 2017-03-06 DIAGNOSIS — Z66 Do not resuscitate: Secondary | ICD-10-CM | POA: Diagnosis present

## 2017-03-06 DIAGNOSIS — Z515 Encounter for palliative care: Secondary | ICD-10-CM | POA: Diagnosis not present

## 2017-03-06 DIAGNOSIS — R52 Pain, unspecified: Secondary | ICD-10-CM | POA: Diagnosis not present

## 2017-03-06 DIAGNOSIS — Z87891 Personal history of nicotine dependence: Secondary | ICD-10-CM | POA: Diagnosis not present

## 2017-03-06 DIAGNOSIS — I110 Hypertensive heart disease with heart failure: Secondary | ICD-10-CM | POA: Diagnosis not present

## 2017-03-06 DIAGNOSIS — S39012A Strain of muscle, fascia and tendon of lower back, initial encounter: Secondary | ICD-10-CM | POA: Diagnosis not present

## 2017-03-06 DIAGNOSIS — T502X5A Adverse effect of carbonic-anhydrase inhibitors, benzothiadiazides and other diuretics, initial encounter: Secondary | ICD-10-CM | POA: Diagnosis not present

## 2017-03-06 DIAGNOSIS — R7989 Other specified abnormal findings of blood chemistry: Secondary | ICD-10-CM | POA: Diagnosis present

## 2017-03-06 DIAGNOSIS — I5033 Acute on chronic diastolic (congestive) heart failure: Secondary | ICD-10-CM | POA: Diagnosis not present

## 2017-03-06 DIAGNOSIS — I447 Left bundle-branch block, unspecified: Secondary | ICD-10-CM | POA: Diagnosis not present

## 2017-03-06 DIAGNOSIS — I5023 Acute on chronic systolic (congestive) heart failure: Secondary | ICD-10-CM | POA: Diagnosis present

## 2017-03-06 DIAGNOSIS — E039 Hypothyroidism, unspecified: Secondary | ICD-10-CM | POA: Diagnosis not present

## 2017-03-06 DIAGNOSIS — K219 Gastro-esophageal reflux disease without esophagitis: Secondary | ICD-10-CM | POA: Diagnosis present

## 2017-03-06 DIAGNOSIS — R748 Abnormal levels of other serum enzymes: Secondary | ICD-10-CM | POA: Diagnosis not present

## 2017-03-06 DIAGNOSIS — R5381 Other malaise: Secondary | ICD-10-CM | POA: Diagnosis not present

## 2017-03-06 DIAGNOSIS — J8410 Pulmonary fibrosis, unspecified: Secondary | ICD-10-CM | POA: Diagnosis not present

## 2017-03-06 DIAGNOSIS — E785 Hyperlipidemia, unspecified: Secondary | ICD-10-CM | POA: Diagnosis not present

## 2017-03-06 DIAGNOSIS — G4733 Obstructive sleep apnea (adult) (pediatric): Secondary | ICD-10-CM | POA: Diagnosis present

## 2017-03-06 DIAGNOSIS — I248 Other forms of acute ischemic heart disease: Secondary | ICD-10-CM | POA: Diagnosis present

## 2017-03-06 DIAGNOSIS — M549 Dorsalgia, unspecified: Secondary | ICD-10-CM | POA: Diagnosis present

## 2017-03-06 DIAGNOSIS — Z825 Family history of asthma and other chronic lower respiratory diseases: Secondary | ICD-10-CM

## 2017-03-06 DIAGNOSIS — Z823 Family history of stroke: Secondary | ICD-10-CM

## 2017-03-06 DIAGNOSIS — Z79899 Other long term (current) drug therapy: Secondary | ICD-10-CM

## 2017-03-06 DIAGNOSIS — J9620 Acute and chronic respiratory failure, unspecified whether with hypoxia or hypercapnia: Secondary | ICD-10-CM | POA: Diagnosis not present

## 2017-03-06 DIAGNOSIS — I5043 Acute on chronic combined systolic (congestive) and diastolic (congestive) heart failure: Secondary | ICD-10-CM

## 2017-03-06 DIAGNOSIS — R0902 Hypoxemia: Secondary | ICD-10-CM | POA: Diagnosis not present

## 2017-03-06 DIAGNOSIS — I509 Heart failure, unspecified: Secondary | ICD-10-CM | POA: Insufficient documentation

## 2017-03-06 DIAGNOSIS — R918 Other nonspecific abnormal finding of lung field: Secondary | ICD-10-CM | POA: Diagnosis not present

## 2017-03-06 DIAGNOSIS — Z79890 Hormone replacement therapy (postmenopausal): Secondary | ICD-10-CM

## 2017-03-06 HISTORY — DX: Pneumonia, unspecified organism: J18.9

## 2017-03-06 LAB — PROTIME-INR
INR: 1.39
PROTHROMBIN TIME: 17 s — AB (ref 11.4–15.2)

## 2017-03-06 LAB — CBC WITH DIFFERENTIAL/PLATELET
Basophils Absolute: 0.1 10*3/uL (ref 0.0–0.1)
Basophils Relative: 1 %
Eosinophils Absolute: 0.1 10*3/uL (ref 0.0–0.7)
Eosinophils Relative: 0 %
HCT: 47 % — ABNORMAL HIGH (ref 36.0–46.0)
HEMOGLOBIN: 15.7 g/dL — AB (ref 12.0–15.0)
LYMPHS ABS: 2.1 10*3/uL (ref 0.7–4.0)
LYMPHS PCT: 12 %
MCH: 33.3 pg (ref 26.0–34.0)
MCHC: 33.4 g/dL (ref 30.0–36.0)
MCV: 99.8 fL (ref 78.0–100.0)
Monocytes Absolute: 1.5 10*3/uL — ABNORMAL HIGH (ref 0.1–1.0)
Monocytes Relative: 9 %
NEUTROS ABS: 13.6 10*3/uL — AB (ref 1.7–7.7)
NEUTROS PCT: 78 %
Platelets: 211 10*3/uL (ref 150–400)
RBC: 4.71 MIL/uL (ref 3.87–5.11)
RDW: 16.4 % — ABNORMAL HIGH (ref 11.5–15.5)
WBC: 17.3 10*3/uL — ABNORMAL HIGH (ref 4.0–10.5)

## 2017-03-06 LAB — COMPREHENSIVE METABOLIC PANEL
ALBUMIN: 3 g/dL — AB (ref 3.5–5.0)
ALK PHOS: 59 U/L (ref 38–126)
ALT: 21 U/L (ref 14–54)
ANION GAP: 14 (ref 5–15)
AST: 34 U/L (ref 15–41)
BUN: 26 mg/dL — ABNORMAL HIGH (ref 6–20)
CALCIUM: 8.7 mg/dL — AB (ref 8.9–10.3)
CO2: 20 mmol/L — AB (ref 22–32)
Chloride: 106 mmol/L (ref 101–111)
Creatinine, Ser: 1.51 mg/dL — ABNORMAL HIGH (ref 0.44–1.00)
GFR calc Af Amer: 36 mL/min — ABNORMAL LOW (ref >60)
GFR calc non Af Amer: 31 mL/min — ABNORMAL LOW (ref >60)
GLUCOSE: 139 mg/dL — AB (ref 65–99)
Potassium: 4.9 mmol/L (ref 3.5–5.1)
SODIUM: 140 mmol/L (ref 135–145)
Total Bilirubin: 1.6 mg/dL — ABNORMAL HIGH (ref 0.3–1.2)
Total Protein: 6.4 g/dL — ABNORMAL LOW (ref 6.5–8.1)

## 2017-03-06 LAB — BRAIN NATRIURETIC PEPTIDE: B Natriuretic Peptide: 2693.7 pg/mL — ABNORMAL HIGH (ref 0.0–100.0)

## 2017-03-06 LAB — TROPONIN I: Troponin I: 0.33 ng/mL (ref <0.03)

## 2017-03-06 MED ORDER — FENTANYL CITRATE (PF) 100 MCG/2ML IJ SOLN
50.0000 ug | Freq: Once | INTRAMUSCULAR | Status: AC
  Administered 2017-03-06: 50 ug via INTRAVENOUS
  Filled 2017-03-06: qty 2

## 2017-03-06 MED ORDER — FUROSEMIDE 10 MG/ML IJ SOLN
40.0000 mg | Freq: Once | INTRAMUSCULAR | Status: AC
  Administered 2017-03-06: 40 mg via INTRAVENOUS
  Filled 2017-03-06: qty 4

## 2017-03-06 NOTE — ED Notes (Signed)
Husband at bedside-other contact is daughter/Lynn [phone]

## 2017-03-06 NOTE — ED Notes (Signed)
Returned from xray

## 2017-03-06 NOTE — ED Provider Notes (Signed)
[facility]-EMERGENCY DEPT Provider Note   CSN: [id number] Arrival date & time: 03/06/17  2034     History   Chief Complaint Chief Complaint  Patient presents with  . Back Pain    HPI [name] is a 83 y.o. female.  The history is provided by the patient.  Back Pain   This is a new problem. The current episode started 2 days ago. The problem occurs constantly. The problem has not changed since onset.Associated with: trying to get up from sitting position. The pain is present in the sacro-iliac joint and lumbar spine. The quality of the pain is described as cramping and aching. The pain does not radiate. The pain is severe. The symptoms are aggravated by bending and certain positions. Pertinent negatives include no chest pain, no numbness, no abdominal pain, no bowel incontinence, no perianal numbness, no bladder incontinence, no pelvic pain, no paresthesias and no weakness.    Last INR of 8 six days ago. Decreased dose.   Past Medical History:  Diagnosis Date  . Atrial fibrillation (HCC)    Paroxysmal  . Cystitis 1974  . DJD (degenerative joint disease)    Dr Kellie Simmering  . Hyperlipidemia 2005   NMR LIPOPROFILE: LDL 144 (1570/732), HDL 61, TG 103., LDL GOAL = < 130  . Idiopathic pulmonary fibrosis    IDIOPATHIC vs CHRONIC ERD  . Left bundle branch block   . Obstructive sleep apnea    CPAP Intolerant  . Pneumonia 01/2017  . Thyroid disease    HYPOTHROIDISM    Patient Active Problem List   Diagnosis Date Noted  . CHF exacerbation (HCC) 03/06/2017  . Long term (current) use of anticoagulants 02/19/2017  . Physical deconditioning 01/29/2017  . Acute on chronic respiratory failure with hypoxia (HCC) 12/15/2016  . Acute on chronic systolic (congestive) heart failure (HCC) 12/15/2016  . Elevated troponin 12/15/2016  . Pressure injury of skin 12/15/2016  . Idiopathic scoliosis 09/12/2015  . Chronic respiratory failure (HCC) 08/19/2014  . Essential hypertension 05/04/2014    . Hyperglycemia 04/20/2014  . Encounter for therapeutic drug monitoring 07/08/2013  . CKD (chronic kidney disease), stage III (HCC) 04/21/2013  . Nonischemic cardiomyopathy (HCC) 11/28/2012  . Anticoagulant long-term use 10/10/2010  . Left bundle branch block   . PULMONARY FIBROSIS 04/20/2009  . GERD 04/20/2009  . EOSINOPHILIA 02/03/2009  . Atrial fibrillation (HCC) 01/18/2009  . Hypothyroidism 07/14/2007  . HYPERLIPIDEMIA 07/14/2007    Past Surgical History:  Procedure Laterality Date  . ABDOMINAL HYSTERECTOMY     Abnormal pap smear; ovaries remaining  . APPENDECTOMY  1966  . CARDIOVERSION N/A 11/07/2012   Procedure: CARDIOVERSION;  Surgeon: Cassell Clement, MD;  Location: [facility] ENDOSCOPY;  Service: Cardiovascular;  Laterality: N/A;  . CHOLECYSTECTOMY  1966  . COLONOSCOPY  2004   negative  . CYSTOSCOPY     Hematuria     OB History    No data available       Home Medications    Prior to Admission medications   Medication Sig Start Date End Date Taking? Authorizing Provider  acetaminophen (TYLENOL) 325 MG tablet Take 325 mg by mouth every 6 (six) hours as needed for pain.   Yes [provider]  albuterol (PROVENTIL HFA;VENTOLIN HFA) 108 (90 Base) MCG/ACT inhaler Inhale 2 puffs into the lungs every 6 (six) hours as needed for wheezing or shortness of breath. 06/19/16  Yes Parrett, Tammy S, NP  furosemide (LASIX) 40 MG tablet Take 1 tablet (40 mg total) by  mouth daily. 01/30/17  Yes Burns, Bobette Mo, MD  levothyroxine (SYNTHROID, LEVOTHROID) 50 MCG tablet Take 1 tablet (50 mcg total) by mouth daily before breakfast. -- Office visit needed for further refills 10/08/16  Yes Burns, Bobette Mo, MD  warfarin (COUMADIN) 5 MG tablet TAKE ONE TABLET BY MOUTH AS DIRECTED BY  ANTICOAGULATION CLINIC Patient taking differently: TAKE ONE TABLET (5 MG) BY MOUTH ON MON AND FRI AND TAKE 0.5 TABLET (2.5 MG) THE REMAINING DAYS OF THE WEEK 08/20/16  Yes Burns, Bobette Mo, MD  nystatin  (MYCOSTATIN/NYSTOP) 100000 UNIT/GM POWD Apply twice a day as needed Patient taking differently: Apply 1 Bottle topically 2 (two) times daily as needed (skin irritation).  09/12/15   Pincus Sanes, MD  predniSONE (DELTASONE) 20 MG tablet Take 1 tablet (20 mg total) by mouth daily with breakfast. 01/29/17   Pincus Sanes, MD    Family History Family History  Problem Relation Age of Onset  . Coronary artery disease Father   . Transient ischemic attack Father   . Stroke Father 39  . Asthma Mother   . Cancer Daughter        R breast; S/P mastectomy  . Diabetes Neg Hx     Social History Social History  Substance Use Topics  . Smoking status: Former Smoker    Packs/day: 0.25    Years: 30.00    Types: Cigarettes    Quit date: 06/04/1965  . Smokeless tobacco: Never Used     Comment: smoked 1953-1967, up to < 1/4 ppd  . Alcohol use Yes     Comment: Socially only     Allergies   Doxycycline; Levaquin [levofloxacin in d5w]; Amoxicillin; Lisinopril; and Metoprolol   Review of Systems Review of Systems  Cardiovascular: Negative for chest pain.  Gastrointestinal: Negative for abdominal pain and bowel incontinence.  Genitourinary: Negative for bladder incontinence and pelvic pain.  Musculoskeletal: Positive for back pain.  Neurological: Negative for weakness, numbness and paresthesias.     Physical Exam Updated Vital Signs BP 138/69   Pulse 87   Temp 98.2 F (36.8 C) (Oral)   Resp (!) 24   Ht 5\' 7"  (1.702 m)   Wt 59 kg (130 lb)   SpO2 97%   BMI 20.36 kg/m   Physical Exam  Constitutional: She is oriented to person, place, and time. She appears well-developed and well-nourished. No distress.  HENT:  Head: Normocephalic and atraumatic.  Nose: Nose normal.  Eyes: Pupils are equal, round, and reactive to light. Conjunctivae and EOM are normal. Right eye exhibits no discharge. Left eye exhibits no discharge. No scleral icterus.  Neck: Normal range of motion. Neck supple.    Cardiovascular: Normal rate and regular rhythm.  Exam reveals no gallop and no friction rub.   No murmur heard. Pulmonary/Chest: Effort normal. No stridor. Tachypnea noted. No respiratory distress. She has rales (diffuse fine rales ).  Abdominal: Soft. She exhibits no distension. There is no tenderness.  Musculoskeletal: She exhibits no edema or tenderness.       Lumbar back: She exhibits no tenderness and no bony tenderness.       Back:  bilateral lower extremity edema  Neurological: She is alert and oriented to person, place, and time.  Spine Exam: Strength: 5/5 throughout LE bilaterally (hip flexion/extension, adduction/abduction; knee flexion/extension; foot dorsiflexion/plantarflexion, inversion/eversion; great toe inversion) Sensation: Intact to light touch in proximal and distal LE bilaterally Reflexes: 1+ quadriceps and achilles reflexes  Skin: Skin is warm and dry.  No rash noted. She is not diaphoretic. No erythema.  Psychiatric: She has a normal mood and affect.  Vitals reviewed.    ED Treatments / Results  Labs (all labs ordered are listed, but only abnormal results are displayed) Labs Reviewed  CBC WITH DIFFERENTIAL/PLATELET - Abnormal; Notable for the following:       Result Value   WBC 17.3 (*)    Hemoglobin 15.7 (*)    HCT 47.0 (*)    RDW 16.4 (*)    Neutro Abs 13.6 (*)    Monocytes Absolute 1.5 (*)    All other components within normal limits  PROTIME-INR - Abnormal; Notable for the following:    Prothrombin Time 17.0 (*)    All other components within normal limits  BRAIN NATRIURETIC PEPTIDE - Abnormal; Notable for the following:    B Natriuretic Peptide 2,693.7 (*)    All other components within normal limits  COMPREHENSIVE METABOLIC PANEL - Abnormal; Notable for the following:    CO2 20 (*)    Glucose, Bld 139 (*)    BUN 26 (*)    Creatinine, Ser 1.51 (*)    Calcium 8.7 (*)    Total Protein 6.4 (*)    Albumin 3.0 (*)    Total Bilirubin 1.6 (*)     GFR calc non Af Amer 31 (*)    GFR calc Af Amer 36 (*)    All other components within normal limits  TROPONIN I - Abnormal; Notable for the following:    Troponin I 0.33 (*)    All other components within normal limits    EKG  EKG Interpretation  Date/Time:  Wednesday March 06 2017 22:02:37 EDT Ventricular Rate:  91 PR Interval:    QRS Duration: 157 QT Interval:  408 QTC Calculation: 474 R Axis:   -84 Text Interpretation:  Sinus or ectopic atrial rhythm Supraventricular bigeminy Borderline prolonged PR interval Probable left atrial enlargement Nonspecific IVCD with LAD LVH with secondary repolarization abnormality Anterolateral infarct, old Otherwise no significant change Confirmed by Drema Pry [phone]) on 03/06/2017 10:38:20 PM       Radiology Dg Chest 2 View  Result Date: 03/06/2017 CLINICAL DATA:  Low back pain EXAM: CHEST  2 VIEW COMPARISON:  12/14/2016, 09/11/2016, 11/08/2015 FINDINGS: Bilateral pulmonary fibrosis. Slight increased diffuse interstitial opacity compared to the prior radiograph. No pleural effusion. Stable cardiomediastinal silhouette with atherosclerosis. No pneumothorax IMPRESSION: Findings consistent with diffuse pulmonary fibrosis. Slight increased interstitial density compared to prior could relate to acute edema or inflammation superimposed on chronic change. Electronically Signed   By: Jasmine Pang M.D.   On: 03/06/2017 22:49   Dg Lumbar Spine Complete  Result Date: 03/06/2017 CLINICAL DATA:  Low back pain EXAM: LUMBAR SPINE - COMPLETE 4+ VIEW COMPARISON:  MRI 06/09/2013 FINDINGS: Mild to moderate levoscoliosis of the lumbar spine. SI joint disease. 8 mm retrolisthesis of L3 on L4. Lumbar vertebral body heights are maintained. Advanced degenerative changes at L1-L2, L2-L3 and L3-L4, and L5-S1. Extensive vascular calcifications in the aorta and left upper quadrant branch vessels IMPRESSION: Moderate levoscoliosis of the lumbar spine with marked  degenerative changes. No acute osseous abnormality Electronically Signed   By: Jasmine Pang M.D.   On: 03/06/2017 22:47    Procedures Procedures (including critical care time)  Medications Ordered in ED Medications  fentaNYL (SUBLIMAZE) injection 50 mcg (50 mcg Intravenous Given 03/06/17 2200)  furosemide (LASIX) injection 40 mg (40 mg Intravenous Given 03/06/17 2310)  fentaNYL (SUBLIMAZE) injection 50 mcg (  50 mcg Intravenous Given 03/06/17 2326)     Initial Impression / Assessment and Plan / ED Course  I have reviewed the triage vital signs and the nursing notes.  Pertinent labs & imaging results that were available during my care of the patient were reviewed by me and considered in my medical decision making (see chart for details).     83 y.o. female presents with back pain in lumbar area for 2 days without signs of radicular pain. No acute traumatic onset. No red flag symptoms of fever, weight loss, saddle anesthesia, weakness, fecal/urinary incontinence or urinary retention.   Plain film without evidence of acute fracture. Suspect MSK etiology.  Given the patient's increased work of breathing, new oxygen requirement, evidence of volume overload in history CHF, cardiac workup was obtained revealing evidence of CHF exacerbation.  EKG without acute ischemic changes. Troponin elevated likely secondary to CHF exacerbation. Patient given IV Lasix. Discussed case with hospitalist will admit the patient for further management.   Final Clinical Impressions(s) / ED Diagnoses   Final diagnoses:  Bilateral rales  Acute on chronic combined systolic and diastolic congestive heart failure (HCC)  Back strain, initial encounter  Pressure injury of sacral region, stage 2      Cardama, Amadeo Garnet, MD 03/07/17 0009

## 2017-03-06 NOTE — Telephone Encounter (Signed)
INR is therapeutic.  Continue current dose of warfarin.

## 2017-03-06 NOTE — Telephone Encounter (Signed)
Ladona Ridgel this nurse is calling back about what to do?  Tammy Sours is out and cindy is out.    Best number [phone]

## 2017-03-06 NOTE — Telephone Encounter (Signed)
LVM with Becky with MDs response.

## 2017-03-06 NOTE — H&P (Signed)
History and Physical    [name] GNF:[id number] DOB: [birth year] DOA: 03/06/2017  PCP: Pincus Sanes, MD Consultants:  Vassie Loll - pulmonology; [name] - cardiology Patient coming from: Home - lives with husband; NOK: Husband, [phone]; daughter, [phone]  Chief Complaint: back pain  HPI: [name] is a 83 y.o. female with medical history significant of hypothyroidism; OSA not on CPAP; IPF; HLD; and afib on Coumadin presenting with back pain.  The history was obtained from the husband and daughter because the patient received pain medication just prior to my evaluation.  She was admitted with CHF from 7/13-18.  She was discharged to [facility] and was there for a month.  She has been home for about a month with home health.  Her back has been bothering her for the last 2 days and has worsneend.  There were concerned that she strained a muscle in her back and she was in a lot of pain.  She had PT on Monday and was doing sitting bicycles and straight leg lifts and bent leg lifts.  The pain started fairly soon after PT.  She has stopped taking her Lasix periodically in the past and there was speculation that she did this time because she was having so much pain that she didn't want to get up.  She takes Coumadin her INR was 8 Friday; her husband reports that the clinic told her to stop taking the Coumadin and also her Lasix at that time.  The clinic called back today and told her to resume the medications.  She was SOB when they arrived in the ER and the family has not noticed this.  +cough tonight, not until today.  Increased LE edema x 2-3 days.   ED Course: Lumbar back pain, Xray negative, suspect MSK etiology.  Also with increased WOB, new O2 requirement, and LE edema with crackles.  EKG negative.  Troponin elevated.  Given 40 mg IV Lasix.  Review of Systems: Unable to perform  Ambulatory Status:   Ambulates with a walker  Past Medical History:  Diagnosis Date  . Atrial  fibrillation (HCC)    Paroxysmal  . Cystitis 1974  . DJD (degenerative joint disease)    Dr Kellie Simmering  . Hyperlipidemia 2005   NMR LIPOPROFILE: LDL 144 (1570/732), HDL 61, TG 103., LDL GOAL = < 130  . Idiopathic pulmonary fibrosis    IDIOPATHIC vs CHRONIC ERD  . Left bundle branch block   . Obstructive sleep apnea    CPAP Intolerant  . Pneumonia 01/2017  . Thyroid disease    HYPOTHROIDISM    Past Surgical History:  Procedure Laterality Date  . ABDOMINAL HYSTERECTOMY     Abnormal pap smear; ovaries remaining  . APPENDECTOMY  1966  . CARDIOVERSION N/A 11/07/2012   Procedure: CARDIOVERSION;  Surgeon: Cassell Clement, MD;  Location: [facility] ENDOSCOPY;  Service: Cardiovascular;  Laterality: N/A;  . CHOLECYSTECTOMY  1966  . COLONOSCOPY  2004   negative  . CYSTOSCOPY     Hematuria     Social History   Social History  . Marital status: Married    Spouse name: N/A  . Number of children: 1  . Years of education: N/A   Occupational History  . Retired Retired    [employer]   Social History Main Topics  . Smoking status: Former Smoker    Packs/day: 0.25    Years: 30.00    Types: Cigarettes    Quit date: 06/04/1965  . Smokeless tobacco:  Never Used     Comment: smoked 1953-1967, up to < 1/4 ppd  . Alcohol use Yes     Comment: Socially only  . Drug use: No  . Sexual activity: Not on file   Other Topics Concern  . Not on file   Social History Narrative   NO REGULAR EXERCISE   11/03/09 DESIGN[employer] FORM SIGNED APPOINTING HUSBAND JAMES Finchum; OK TO LEAVE MESSAGE ON HOME # [phone]    Allergies  Allergen Reactions  . Doxycycline Nausea Only  . Levaquin [Levofloxacin In D5w]     REACTION: leg weakness  . Amoxicillin     REACTION: upset stomach  . Lisinopril     cough  . Metoprolol     diarrhea    Family History  Problem Relation Age of Onset  . Coronary artery disease Father   . Transient ischemic attack Father   . Stroke Father 2  . Asthma Mother   . Cancer  Daughter        R breast; S/P mastectomy  . Diabetes Neg Hx     Prior to Admission medications   Medication Sig Start Date End Date Taking? Authorizing Provider  acetaminophen (TYLENOL) 325 MG tablet Take 325 mg by mouth every 6 (six) hours as needed for pain.   Yes [provider]  albuterol (PROVENTIL HFA;VENTOLIN HFA) 108 (90 Base) MCG/ACT inhaler Inhale 2 puffs into the lungs every 6 (six) hours as needed for wheezing or shortness of breath. 06/19/16  Yes Parrett, Tammy S, NP  furosemide (LASIX) 40 MG tablet Take 1 tablet (40 mg total) by mouth daily. 01/30/17  Yes Burns, Bobette Mo, MD  levothyroxine (SYNTHROID, LEVOTHROID) 50 MCG tablet Take 1 tablet (50 mcg total) by mouth daily before breakfast. -- Office visit needed for further refills 10/08/16  Yes Burns, Bobette Mo, MD  warfarin (COUMADIN) 5 MG tablet TAKE ONE TABLET BY MOUTH AS DIRECTED BY  ANTICOAGULATION CLINIC Patient taking differently: TAKE ONE TABLET (5 MG) BY MOUTH ON MON AND FRI AND TAKE 0.5 TABLET (2.5 MG) THE REMAINING DAYS OF THE WEEK 08/20/16  Yes Burns, Bobette Mo, MD  nystatin (MYCOSTATIN/NYSTOP) 100000 UNIT/GM POWD Apply twice a day as needed Patient taking differently: Apply 1 Bottle topically 2 (two) times daily as needed (skin irritation).  09/12/15   Pincus Sanes, MD  predniSONE (DELTASONE) 20 MG tablet Take 1 tablet (20 mg total) by mouth daily with breakfast. 01/29/17   Pincus Sanes, MD    Physical Exam: Vitals:   03/06/17 2047 03/06/17 2300  BP: 103/65 138/69  Pulse: 84 87  Resp: 20 (!) 24  Temp: 98.2 F (36.8 C)   TempSrc: Oral   SpO2: 90% 97%  Weight: 59 kg (130 lb)   Height: 5\' 7"  (1.702 m)      General:  Appears calm and comfortable and is NAD; her eyes are open but she is generally unresponsive.  She does not attempt to answer any questions but does follow simple commands. Eyes:  PERRL, EOMI, normal lids, iris ENT:  grossly normal hearing, lips & tongue, mmm Neck:  no LAD, masses or  thyromegaly Cardiovascular:  Irregularly irregular, no m/r/g.  Respiratory:  Bibasilar rales diffusely in the lung fields.  Normal respiratory effort. Abdomen:  soft, NT, ND, NABS Skin:  no rash or induration seen on limited exam Musculoskeletal:  grossly normal tone BUE/BLE, good ROM, no bony abnormality.  Marked 3+ LE pitting edema to mid-thigh. Psychiatric: obtunded - eyes are  open but she is mostly unresponsive from narcotics Neurologic:  Unable to assess    Radiological Exams on Admission: Dg Chest 2 View  Result Date: 03/06/2017 CLINICAL DATA:  Low back pain EXAM: CHEST  2 VIEW COMPARISON:  12/14/2016, 09/11/2016, 11/08/2015 FINDINGS: Bilateral pulmonary fibrosis. Slight increased diffuse interstitial opacity compared to the prior radiograph. No pleural effusion. Stable cardiomediastinal silhouette with atherosclerosis. No pneumothorax IMPRESSION: Findings consistent with diffuse pulmonary fibrosis. Slight increased interstitial density compared to prior could relate to acute edema or inflammation superimposed on chronic change. Electronically Signed   By: Jasmine Pang M.D.   On: 03/06/2017 22:49   Dg Lumbar Spine Complete  Result Date: 03/06/2017 CLINICAL DATA:  Low back pain EXAM: LUMBAR SPINE - COMPLETE 4+ VIEW COMPARISON:  MRI 06/09/2013 FINDINGS: Mild to moderate levoscoliosis of the lumbar spine. SI joint disease. 8 mm retrolisthesis of L3 on L4. Lumbar vertebral body heights are maintained. Advanced degenerative changes at L1-L2, L2-L3 and L3-L4, and L5-S1. Extensive vascular calcifications in the aorta and left upper quadrant branch vessels IMPRESSION: Moderate levoscoliosis of the lumbar spine with marked degenerative changes. No acute osseous abnormality Electronically Signed   By: Jasmine Pang M.D.   On: 03/06/2017 22:47    EKG: Independently reviewed.  NSR with rate 91; LVH, IVCD, nonspecific ST changes with no evidence of acute ischemia   Labs on Admission: I have  personally reviewed the available labs and imaging studies at the time of the admission.  Pertinent labs:   CO2 20 Glucose 139 BUN 26/Creatinine 1.51/GFR 31; 48/1.41 on 8/28 Albumin 3.0 Bili 1.6 BNP 2693.7; 1958.3 on 7/13 Troponin 0.33 WBC 17.3 INR 1.39; 8 on 9/27   Assessment/Plan Principal Problem:   Acute on chronic respiratory failure with hypoxia (HCC) Active Problems:   Hypothyroidism   Atrial fibrillation (HCC)   PULMONARY FIBROSIS   Anticoagulant long-term use   CKD (chronic kidney disease), stage III (HCC)   Hyperglycemia   Essential hypertension   Acute on chronic systolic (congestive) heart failure (HCC)   Elevated troponin   Back pain   Acute on chronic respiratory failure related to CHF exacerbation -Patient with known h/o idiopathic pulmonary fibrosis as well as CHF -Echo 7/14 showed EF 50-55% with grade 1 diastolic dysfunction -Patient was compensated according to family until she was told to hold the Lasix and Coumadin due to INR 8 (this may be a misunderstanding) -Patient presenting with back pain but found to have SOB, hypoxia, elevated BNP -Will admit with telemetry -She should not require repeat echocardiogram -Will start ASA -She would benefit from lisinopril if her CKD can support it -No beta blocker due to acute decompensation on presentation but this also likely needs to be started -CHF order set utilized -Was given Lasix 40 mg x 1 in ER and will repeat with 40 mg IV BID -Continue [place] O2 for now -Repeat EKG in AM -Will r/o with serial troponins although doubt ACS based on symptoms; suspect that her troponin elevation is due to demand ischemia   Afib on Coumadin -In review of her INR, it appears that she has wide variation in her INR -She may benefit from novel anti-coagulant therapy instead -She is rate controlled  Back pain -Negative xray -Likely MSK related -Robaxin and Tylenol prn  CKD -Appears to be relatively stable -Recheck BMP in  AM  Hypothyroidism -Check TSH -Continue Synthroid at current dose for now  Hyperglycemia -May be stress response -Will follow with fasting AM labs -It is unlikely  that he will need acute or chronic treatment for this issue  DVT prophylaxis: Lovenox  Code Status:  DNR - confirmed with family Family Communication: Husband and daughter were present throughout evaluation Disposition Plan:  Home once clinically improved Consults called: CM/PT/OT Admission status: Admit - It is my clinical opinion that admission to INPATIENT is reasonable and necessary because this patient will require at least 2 midnights in the hospital to treat this condition based on the medical complexity of the problems presented.  Given the aforementioned information, the predictability of an adverse outcome is felt to be significant.    Jonah Blue MD [facility]  If note is complete, please contact covering daytime or nighttime physician. [email or web address] Password [email or web address]  03/07/2017, 12:37 AM

## 2017-03-06 NOTE — ED Triage Notes (Signed)
Patient arrives by EMS from home with complaints of lower back pain. Patient stated to EMS acute onset lower back pain 2 days ago-has a home health health nurse who comes every day and stated she did not notify anyone of the lower back pain. EMS states unable to ambulate tonight due to pain. Patient states she has not tried any OTC pain meds. EMS gave Fentanyl 25 mcg x 2 doses. Pain decreased after IV pain meds. BP 120/64 HR 88 RR22 O2 sat 88% on O2 4l/[place]-patient has pulmonary fibrosis and COPD and is on chronic O2 at 4l/[place] at home. Hx atrial fib-currently SR with BBB

## 2017-03-07 DIAGNOSIS — R748 Abnormal levels of other serum enzymes: Secondary | ICD-10-CM

## 2017-03-07 DIAGNOSIS — I4891 Unspecified atrial fibrillation: Secondary | ICD-10-CM

## 2017-03-07 DIAGNOSIS — M549 Dorsalgia, unspecified: Secondary | ICD-10-CM | POA: Diagnosis present

## 2017-03-07 DIAGNOSIS — J9620 Acute and chronic respiratory failure, unspecified whether with hypoxia or hypercapnia: Secondary | ICD-10-CM

## 2017-03-07 DIAGNOSIS — I482 Chronic atrial fibrillation: Secondary | ICD-10-CM

## 2017-03-07 DIAGNOSIS — E039 Hypothyroidism, unspecified: Secondary | ICD-10-CM

## 2017-03-07 DIAGNOSIS — N183 Chronic kidney disease, stage 3 (moderate): Secondary | ICD-10-CM

## 2017-03-07 DIAGNOSIS — R0902 Hypoxemia: Secondary | ICD-10-CM

## 2017-03-07 DIAGNOSIS — L89152 Pressure ulcer of sacral region, stage 2: Secondary | ICD-10-CM

## 2017-03-07 DIAGNOSIS — S39012A Strain of muscle, fascia and tendon of lower back, initial encounter: Secondary | ICD-10-CM

## 2017-03-07 DIAGNOSIS — R7989 Other specified abnormal findings of blood chemistry: Secondary | ICD-10-CM

## 2017-03-07 DIAGNOSIS — I5033 Acute on chronic diastolic (congestive) heart failure: Secondary | ICD-10-CM

## 2017-03-07 LAB — CBC
HCT: 47 % — ABNORMAL HIGH (ref 36.0–46.0)
Hemoglobin: 15.7 g/dL — ABNORMAL HIGH (ref 12.0–15.0)
MCH: 33.2 pg (ref 26.0–34.0)
MCHC: 33.4 g/dL (ref 30.0–36.0)
MCV: 99.4 fL (ref 78.0–100.0)
PLATELETS: 189 10*3/uL (ref 150–400)
RBC: 4.73 MIL/uL (ref 3.87–5.11)
RDW: 16.6 % — ABNORMAL HIGH (ref 11.5–15.5)
WBC: 16.1 10*3/uL — ABNORMAL HIGH (ref 4.0–10.5)

## 2017-03-07 LAB — TROPONIN I
TROPONIN I: 0.26 ng/mL — AB (ref <0.03)
Troponin I: 0.25 ng/mL (ref <0.03)
Troponin I: 0.29 ng/mL (ref <0.03)

## 2017-03-07 LAB — BASIC METABOLIC PANEL
Anion gap: 11 (ref 5–15)
BUN: 28 mg/dL — ABNORMAL HIGH (ref 6–20)
CALCIUM: 8.7 mg/dL — AB (ref 8.9–10.3)
CO2: 21 mmol/L — AB (ref 22–32)
CREATININE: 1.61 mg/dL — AB (ref 0.44–1.00)
Chloride: 107 mmol/L (ref 101–111)
GFR calc non Af Amer: 28 mL/min — ABNORMAL LOW (ref >60)
GFR, EST AFRICAN AMERICAN: 33 mL/min — AB (ref >60)
Glucose, Bld: 127 mg/dL — ABNORMAL HIGH (ref 65–99)
Potassium: 4.5 mmol/L (ref 3.5–5.1)
Sodium: 139 mmol/L (ref 135–145)

## 2017-03-07 LAB — T4, FREE: Free T4: 1.68 ng/dL — ABNORMAL HIGH (ref 0.61–1.12)

## 2017-03-07 LAB — GLUCOSE, CAPILLARY: GLUCOSE-CAPILLARY: 127 mg/dL — AB (ref 65–99)

## 2017-03-07 LAB — TSH: TSH: 5.695 u[IU]/mL — AB (ref 0.350–4.500)

## 2017-03-07 MED ORDER — LEVOTHYROXINE SODIUM 50 MCG PO TABS
50.0000 ug | ORAL_TABLET | Freq: Every day | ORAL | Status: DC
  Administered 2017-03-07 – 2017-03-12 (×5): 50 ug via ORAL
  Filled 2017-03-07 (×5): qty 1

## 2017-03-07 MED ORDER — NALOXONE HCL 0.4 MG/ML IJ SOLN
INTRAMUSCULAR | Status: AC
  Administered 2017-03-07: 0.4 mg
  Filled 2017-03-07: qty 1

## 2017-03-07 MED ORDER — NALOXONE HCL 0.4 MG/ML IJ SOLN
0.4000 mg | Freq: Once | INTRAMUSCULAR | Status: AC
  Administered 2017-03-07: 0.4 mg via INTRAVENOUS

## 2017-03-07 MED ORDER — ASPIRIN EC 81 MG PO TBEC
81.0000 mg | DELAYED_RELEASE_TABLET | Freq: Every day | ORAL | Status: DC
  Administered 2017-03-07: 81 mg via ORAL
  Filled 2017-03-07: qty 1

## 2017-03-07 MED ORDER — ONDANSETRON HCL 4 MG/2ML IJ SOLN: 4.0000 mg | Freq: Four times a day (QID) | INTRAMUSCULAR | Status: DC | PRN

## 2017-03-07 MED ORDER — WARFARIN SODIUM 2.5 MG PO TABS
2.5000 mg | ORAL_TABLET | Freq: Once | ORAL | Status: AC
  Administered 2017-03-07: 2.5 mg via ORAL
  Filled 2017-03-07: qty 1

## 2017-03-07 MED ORDER — ACETAMINOPHEN 325 MG PO TABS
650.0000 mg | ORAL_TABLET | Freq: Three times a day (TID) | ORAL | Status: DC
  Administered 2017-03-08 – 2017-03-11 (×9): 650 mg via ORAL
  Filled 2017-03-07 (×12): qty 2

## 2017-03-07 MED ORDER — SODIUM CHLORIDE 0.9% FLUSH
3.0000 mL | Freq: Two times a day (BID) | INTRAVENOUS | Status: DC
Start: 2017-03-07 — End: 2017-03-12
  Administered 2017-03-07 – 2017-03-11 (×11): 3 mL via INTRAVENOUS

## 2017-03-07 MED ORDER — ENSURE ENLIVE PO LIQD
237.0000 mL | Freq: Two times a day (BID) | ORAL | Status: DC
  Administered 2017-03-07: 237 mL via ORAL

## 2017-03-07 MED ORDER — METHOCARBAMOL 500 MG PO TABS
500.0000 mg | ORAL_TABLET | Freq: Once | ORAL | Status: AC
  Administered 2017-03-07: 500 mg via ORAL
  Filled 2017-03-07: qty 1

## 2017-03-07 MED ORDER — NALOXONE HCL 0.4 MG/ML IJ SOLN: 0.4000 mg | INTRAMUSCULAR | Status: DC | PRN

## 2017-03-07 MED ORDER — WARFARIN - PHARMACIST DOSING INPATIENT: Freq: Every day | Status: DC

## 2017-03-07 MED ORDER — FUROSEMIDE 10 MG/ML IJ SOLN: 40.0000 mg | Freq: Two times a day (BID) | INTRAMUSCULAR | Status: DC

## 2017-03-07 MED ORDER — ORAL CARE MOUTH RINSE
15.0000 mL | Freq: Two times a day (BID) | OROMUCOSAL | Status: DC
  Administered 2017-03-07 – 2017-03-11 (×5): 15 mL via OROMUCOSAL

## 2017-03-07 MED ORDER — MORPHINE SULFATE (PF) 4 MG/ML IV SOLN
1.0000 mg | INTRAVENOUS | Status: DC | PRN
  Administered 2017-03-07 (×2): 1 mg via INTRAVENOUS
  Filled 2017-03-07 (×2): qty 1

## 2017-03-07 MED ORDER — SODIUM CHLORIDE 0.9% FLUSH: 3.0000 mL | INTRAVENOUS | Status: DC | PRN

## 2017-03-07 MED ORDER — SODIUM CHLORIDE 0.9 % IV SOLN: 250.0000 mL | INTRAVENOUS | Status: DC | PRN

## 2017-03-07 MED ORDER — ACETAMINOPHEN 325 MG PO TABS: 650.0000 mg | ORAL_TABLET | ORAL | Status: DC | PRN

## 2017-03-07 MED ORDER — METHOCARBAMOL 500 MG PO TABS
500.0000 mg | ORAL_TABLET | Freq: Three times a day (TID) | ORAL | Status: DC | PRN
  Administered 2017-03-08 – 2017-03-11 (×5): 500 mg via ORAL
  Filled 2017-03-07 (×6): qty 1

## 2017-03-07 MED ORDER — METHOCARBAMOL 1000 MG/10ML IJ SOLN
500.0000 mg | Freq: Four times a day (QID) | INTRAVENOUS | Status: DC | PRN
  Administered 2017-03-07: 500 mg via INTRAVENOUS
  Filled 2017-03-07: qty 550
  Filled 2017-03-07: qty 5

## 2017-03-07 MED ORDER — WARFARIN SODIUM 5 MG PO TABS
5.0000 mg | ORAL_TABLET | Freq: Once | ORAL | Status: AC
  Administered 2017-03-07: 5 mg via ORAL
  Filled 2017-03-07: qty 1

## 2017-03-07 MED ORDER — PREDNISONE 20 MG PO TABS
20.0000 mg | ORAL_TABLET | Freq: Every day | ORAL | Status: DC
  Administered 2017-03-07 – 2017-03-12 (×5): 20 mg via ORAL
  Filled 2017-03-07 (×5): qty 1

## 2017-03-07 MED ORDER — HYDROMORPHONE HCL-NACL 0.5-0.9 MG/ML-% IV SOSY
1.0000 mg | PREFILLED_SYRINGE | Freq: Once | INTRAVENOUS | Status: AC
  Administered 2017-03-07: 1 mg via INTRAVENOUS
  Filled 2017-03-07: qty 2

## 2017-03-07 MED ORDER — FUROSEMIDE 10 MG/ML IJ SOLN
20.0000 mg | Freq: Two times a day (BID) | INTRAMUSCULAR | Status: DC
  Administered 2017-03-08: 20 mg via INTRAVENOUS
  Filled 2017-03-07: qty 2

## 2017-03-07 NOTE — Progress Notes (Signed)
ANTICOAGULATION CONSULT NOTE   Pharmacy Consult for warfarin Indication: atrial fibrillation  Allergies  Allergen Reactions  . Dilaudid [Hydromorphone]     Patient went unresponsive  . Doxycycline Nausea Only  . Levaquin [Levofloxacin In D5w]     REACTION: leg weakness  . Amoxicillin     REACTION: upset stomach  . Lisinopril     cough  . Metoprolol     diarrhea    Patient Measurements: Height: 5\' 7"  (170.2 cm) Weight: 132 lb 7.9 oz (60.1 kg) IBW/kg (Calculated) : 61.6   Vital Signs: Temp: 98.2 F (36.8 C) (10/04 0424) Temp Source: Oral (10/04 0424) BP: 98/65 (10/04 0659) Pulse Rate: 112 (10/04 0659)  Labs:  Recent Labs  03/06/17 2155 03/07/17 0138 03/07/17 0739  HGB 15.7*  --  15.7*  HCT 47.0*  --  47.0*  PLT 211  --  189  LABPROT 17.0*  --   --   INR 1.39  --   --   CREATININE 1.51*  --   --   TROPONINI 0.33* 0.25* 0.29*    Estimated Creatinine Clearance: 26.8 mL/min (A) (by C-G formula based on SCr of 1.51 mg/dL (H)).   Infusions:  . sodium chloride    . methocarbamol (ROBAXIN)  IV Stopped (03/07/17 0332)    Assessment: 89 yoF c/o back pain on chronic warfarin for A-fib. INR=1.39 on admission.  Home dose is 5 mg M/F and 2.5 mg other days.  Last dose was 10/1.  Recent INRs as outpatient have been SUPRAtherapeutic.    Today, 03/07/2017  Warfarin 5mg  given at ~1am today  No new INR since last evening (= 1.39)  CBC: Hgb inc, pltc WNL  Goal of Therapy:  INR 2-3    Plan:  Warfarin 2.5 mg x1 tonight as per home dosing Daily PT/INR  Juliette Alcide, PharmD, BCPS.   Pager: [phone] 03/07/2017 8:32 AM

## 2017-03-07 NOTE — Progress Notes (Signed)
OT Cancellation Note  Patient Details Name: [name] MRN: [id number] DOB: [birth year]  He re Cancelled Treatment:    Reason Eval/Treat Not Completed: Patient declined, no reason specified.  Pt states she cannot participate in OT at this time. She reports that she has been up to commode. Will check back tomorrow.  Jasper Hanf 03/07/2017, 2:45 PM  Marica Otter, OTR/L [phone] 03/07/2017

## 2017-03-07 NOTE — Progress Notes (Addendum)
[facility] PROGRESS NOTE  [name] RCB:[id number] DOB: [birth year] DOA: 03/06/2017 PCP: Pincus Sanes, MD  Interim Summary and HPI 83 y.o. female with medical history significant of hypothyroidism; OSA not on CPAP; IPF; HLD; and afib on Coumadin presenting with back pain and worsening breathing/SOB. Patient found with increase LE swelling, elevated BNP and vascular congestion on CXR. Admitted for CHF exacerbation and further assessment/control of back pain.  Assessment/Plan: 1-acute on chronic resp failure: with vascular congestion on x-ray, elevated BNP and physical exam demonstrating increase volume status. Presentation and worsening SOB due to acute on chronic diastolic HF. -will continue IV lasix, dose adjusted given response and increase in Cr trend -follow daily weights and strict intake/output -continue heart healthy diet -patient is allergic to b-blocker and has acute exacerbation -no ACE/ARB due to renal dysfunction -patient denies CP -will follow clinical response -2-D echo in July with preserved EF and grade 1 DD  2-elevated troponin -in absence of CP or acute ischemic changes on EKG -most likely demand ischemia from CHF exacerbation, in the setting of CKD. -will continue to monitor and provide supportive care -will follow clinical response  -given recent elevated INR and no specific indicators of ischemic process, will stop ASA.  3-A. Fib: chronically on Coumadin  -patient INR subtherapeutic now; but up to 8 a week ago -will resume coumadin per pharmacy  -rate controlled.  4-acute lower back pain: -appears to be MSK in nature -worse with physical activity and movements -patient with episode of unresponsiveness while using IV narcotics -will start schedule tylenol, PRN muscle relaxer and PRN tramadol for severe pain -PT/OT ordered  5-hypothyroidism  -with mild elevated TSH -Free T4 no correlating with abnormal TSH findings -most likely sick  thyroid -will recommend repeat thyroid panel in 4 weeks -continue current dose of synthroid   6-chronic resp failure and hx of IPF -will continue oxygen supplementation -continue chronic prednisone use and PRN inhaler therapy  7-CKD -stage 3 at baseline -with slight rising of Cr most likely from diuresis -will monitor trend -lasix dose adjusted   8-stage 2 pressure injury on sacral region -present on admission -will provide preventive measures   Code Status: DNR. Family Communication: no family at bedside. Disposition Plan: remains inpatient; will continue IV lasix, but will adjust dose giving increase in her Cr; avoid use of narcotics (especially IV dosages). Will follow clinical response.   Consultants:  None   Procedures:  See below for X-ray reports   Antibiotics:  None   HPI/Subjective: No CP, no nausea, no vomiting. Patient 1.3L neg. Complaining of lower back pain. Overnight with episode of unresponsiveness after IV narcotics and requiring Narcan.  Objective: Vitals:   03/07/17 1334 03/07/17 2110  BP: 137/70 133/81  Pulse: 73 (!) 125  Resp: 18 18  Temp: 98.3 F (36.8 C) 98.6 F (37 C)  SpO2: 95% 96%    Intake/Output Summary (Last 24 hours) at 03/07/17 2206 Last data filed at 03/07/17 1625  Gross per 24 hour  Intake              115 ml  Output             1450 ml  Net            -1335 ml   Filed Weights   03/06/17 2047 03/07/17 0055 03/07/17 0424  Weight: 59 kg (130 lb) 60 kg (132 lb 4.4 oz) 60.1 kg (132 lb 7.9 oz)    Exam:   General: somnolent,  denying CP and in no resp distress. Patient received IV pain medication overnight that ended requiring use of Narcan later on. No nausea, vomiting, diarrhea or abd pain described. Still complaining of intermittent lower back pain.  Cardiovascular: irregular, no rubs, no gallops, no JVD  Respiratory: fair air movement, normal resp effort, no wheezing, positive bibasilar crackles. Patient using 2L of [place]  oxygen.  Abdomen:soft, NT, ND, positive BS.  Musculoskeletal: 2+ swelling bilaterally, no cyanosis; no clubbing.  Skin: stage 2 pressure injury seen on sacral region; no superimposed infection or drainage appreciated. Injury on skin present on admission.  Data Reviewed: Basic Metabolic Panel:  Recent Labs Lab 03/06/17 2155 03/07/17 0739  NA 140 139  K 4.9 4.5  CL 106 107  CO2 20* 21*  GLUCOSE 139* 127*  BUN 26* 28*  CREATININE 1.51* 1.61*  CALCIUM 8.7* 8.7*   Liver Function Tests:  Recent Labs Lab 03/06/17 2155  AST 34  ALT 21  ALKPHOS 59  BILITOT 1.6*  PROT 6.4*  ALBUMIN 3.0*   CBC:  Recent Labs Lab 03/06/17 2155 03/07/17 0739  WBC 17.3* 16.1*  NEUTROABS 13.6*  --   HGB 15.7* 15.7*  HCT 47.0* 47.0*  MCV 99.8 99.4  PLT 211 189   Cardiac Enzymes:  Recent Labs Lab 03/06/17 2155 03/07/17 0138 03/07/17 0739 03/07/17 1156  TROPONINI 0.33* 0.25* 0.29* 0.26*   BNP (last 3 results)  Recent Labs  12/14/16 2033 03/06/17 2155  BNP 1,958.3* 2,693.7*    ProBNP (last 3 results)  Recent Labs  09/11/16 1455 01/29/17 1432  PROBNP 914.0* 1,247.0*    CBG:  Recent Labs Lab 03/07/17 0638  GLUCAP 127*    Studies: Dg Chest 2 View  Result Date: 03/06/2017 CLINICAL DATA:  Low back pain EXAM: CHEST  2 VIEW COMPARISON:  12/14/2016, 09/11/2016, 11/08/2015 FINDINGS: Bilateral pulmonary fibrosis. Slight increased diffuse interstitial opacity compared to the prior radiograph. No pleural effusion. Stable cardiomediastinal silhouette with atherosclerosis. No pneumothorax IMPRESSION: Findings consistent with diffuse pulmonary fibrosis. Slight increased interstitial density compared to prior could relate to acute edema or inflammation superimposed on chronic change. Electronically Signed   By: Jasmine Pang M.D.   On: 03/06/2017 22:49   Dg Lumbar Spine Complete  Result Date: 03/06/2017 CLINICAL DATA:  Low back pain EXAM: LUMBAR SPINE - COMPLETE 4+ VIEW  COMPARISON:  MRI 06/09/2013 FINDINGS: Mild to moderate levoscoliosis of the lumbar spine. SI joint disease. 8 mm retrolisthesis of L3 on L4. Lumbar vertebral body heights are maintained. Advanced degenerative changes at L1-L2, L2-L3 and L3-L4, and L5-S1. Extensive vascular calcifications in the aorta and left upper quadrant branch vessels IMPRESSION: Moderate levoscoliosis of the lumbar spine with marked degenerative changes. No acute osseous abnormality Electronically Signed   By: Jasmine Pang M.D.   On: 03/06/2017 22:47    Scheduled Meds: . acetaminophen  650 mg Oral TID  . feeding supplement (ENSURE ENLIVE)  237 mL Oral BID BM  . [START ON 03/08/2017] furosemide  20 mg Intravenous Q12H  . levothyroxine  50 mcg Oral QAC breakfast  . mouth rinse  15 mL Mouth Rinse BID  . predniSONE  20 mg Oral Q breakfast  . sodium chloride flush  3 mL Intravenous Q12H  . Warfarin - Pharmacist Dosing Inpatient   Does not apply q1800   Continuous Infusions: . sodium chloride      Principal Problem:   Acute on chronic respiratory failure with hypoxia (HCC) Active Problems:   Hypothyroidism   Atrial fibrillation (  HCC)   PULMONARY FIBROSIS   Anticoagulant long-term use   CKD (chronic kidney disease), stage III (HCC)   Hyperglycemia   Acute on chronic systolic (congestive) heart failure (HCC)   Elevated troponin   Back pain    Time spent: 35 minutes.    Vassie Loll  [facility] Pager [phone]. If 7PM-7AM, please contact night-coverage at [email or web address], password [facility] 03/07/2017, 10:06 PM  LOS: 1 day

## 2017-03-07 NOTE — Progress Notes (Signed)
ANTICOAGULATION CONSULT NOTE - Initial Consult  Pharmacy Consult for warfarin Indication: atrial fibrillation  Allergies  Allergen Reactions  . Doxycycline Nausea Only  . Levaquin [Levofloxacin In D5w]     REACTION: leg weakness  . Amoxicillin     REACTION: upset stomach  . Lisinopril     cough  . Metoprolol     diarrhea    Patient Measurements: Height: 5\' 7"  (170.2 cm) Weight: 132 lb 4.4 oz (60 kg) IBW/kg (Calculated) : 61.6   Vital Signs: Temp: 97.8 F (36.6 C) (10/04 0055) Temp Source: Axillary (10/04 0055) BP: 101/64 (10/04 0055) Pulse Rate: 110 (10/04 0055)  Labs:  Recent Labs  03/06/17 2155  HGB 15.7*  HCT 47.0*  PLT 211  LABPROT 17.0*  INR 1.39  CREATININE 1.51*  TROPONINI 0.33*    Estimated Creatinine Clearance: 26.7 mL/min (A) (by C-G formula based on SCr of 1.51 mg/dL (H)).   Medical History: Past Medical History:  Diagnosis Date  . Atrial fibrillation (HCC)    Paroxysmal  . Cystitis 1974  . DJD (degenerative joint disease)    Dr Kellie Simmering  . Hyperlipidemia 2005   NMR LIPOPROFILE: LDL 144 (1570/732), HDL 61, TG 103., LDL GOAL = < 130  . Idiopathic pulmonary fibrosis    IDIOPATHIC vs CHRONIC ERD  . Left bundle branch block   . Obstructive sleep apnea    CPAP Intolerant  . Pneumonia 01/2017  . Thyroid disease    HYPOTHROIDISM    Medications:  Scheduled:  . aspirin EC  81 mg Oral Daily  . [START ON 03/08/2017] furosemide  40 mg Intravenous Q12H  . levothyroxine  50 mcg Oral QAC breakfast  . predniSONE  20 mg Oral Q breakfast  . sodium chloride flush  3 mL Intravenous Q12H  . warfarin  5 mg Oral Once  . Warfarin - Pharmacist Dosing Inpatient   Does not apply q1800   Infusions:  . sodium chloride    . methocarbamol (ROBAXIN)  IV      Assessment: 73 yoF c/o back pain on chronic warfarin for A-fib. INR=1.39 on admission.  HD 5 mg M/F and 2.5 mg other days.  LD 10/1.  Goal of Therapy:  INR 2-3    Plan:  Warfarin 5 mg x1  now Daily PT/INR  [name] R 03/07/2017,12:59 AM

## 2017-03-07 NOTE — ED Notes (Signed)
Call report to [place], RN at 00:15 [phone]

## 2017-03-07 NOTE — Progress Notes (Signed)
PT Cancellation Note  Patient Details Name: NORMALINDA NOBLITT MRN: [id number] DOB: 10/09/1931   Cancelled Treatment:    Reason Eval/Treat Not Completed: Attempted PT eval. Pt declined participation with therapy on today. Wil check back another day.    Rebeca Alert, MPT Pager: [phone]

## 2017-03-07 NOTE — Progress Notes (Addendum)
Pt c/o uncontrolled back pain 10/10 with current pain regimen. Dr. Rueben Bash notified and verbally ordered one time dose of 1mg  IV dilaudid.   @0625  1mg  IV dilaudid was administered to pt. After administration of drug pt appeared to look dazed with fixed pupils and would only respond to sternal rub. Rapid response was called and Dr. Toniann Fail paged and notified. 0.4mg  IV narcan was administered.  Rapid response came to bedside and assessed pt, after administration of narcan pt became alert and oriented. Vital signs stable. Will continue to monitor pt closely.

## 2017-03-07 NOTE — Care Management Note (Signed)
Case Management Note  CM contacted by Tim with [facility] stating pt is active with [facility] RN, PT, OT.

## 2017-03-07 NOTE — Progress Notes (Signed)
Pt asked for a bedpan at this time to try and urinate. Pt unable to pee on bedpan and states "she "can't get up right now" to urinate on the [facility] or walk to the bathroom. Pt does not have the urge to void at this time after sitting on the bed pan. This RN bladder scanned pt and bladder scan showed . On call Dr. Toniann Fail notified and verbally ordered a in and out cath. Will continue to monitor pt.

## 2017-03-07 NOTE — Progress Notes (Signed)
Pt's AM EKG showed wide QRS complexes and pt's HR sustaining low 100s. On call Dr. Toniann Fail notified.

## 2017-03-07 NOTE — Progress Notes (Signed)
Initial Nutrition Assessment  DOCUMENTATION CODES:   Not applicable  INTERVENTION:   -Continue Ensure Enlive BID. Each supplement provides 350 kcals and 20 grams of protein  NUTRITION DIAGNOSIS:   Increased nutrient needs related to acute illness (acute on chronic respiratory failure) as evidenced by estimated needs.  GOAL:   Patient will meet greater than or equal to 90% of their needs  MONITOR:   PO intake, Supplement acceptance, Weight trends, Labs, Skin  REASON FOR ASSESSMENT:   Malnutrition Screening Tool    ASSESSMENT:   Pt is an 83 year old female admitted for back pain and acute on chronic respiratory failure with hypoxia. PMH of hypothyroidism, OSA not on CPAP, IPF, HLD, A fib on coumadin, and CHF.  Pt was very drowsy upon visit d/t medication, NT reports pt did not consume breakfast as she has been sleeping since admission. Pt reports a good appetite PTA and eating 3 meals per day. She could not recall what she usually eats. Pt states she has no n/v related to back pain. No issues chewing or swallowing.   Per weight records in chart, pt currently weighs 132 lbs, 8/28- 129 lbs, 7/18- 130 lbs, 4/10- 136 lbs. Per H&P note, pt stopped taking Lasix and Coumadin per clinic recommendation and has now resumed.   Medications: Coumadin, Lasix, NS 9%  Labs: Glucose 127 (H), BUN 28 (H), Cr 1.6 (H), Ca 8.7 (L)  Nutrition Focused Physical Exam Findings: Severe fat depletion in upper arm. No muscle depletion. Mild edema in ankles.   Diet Order:  Diet Heart Room service appropriate? Yes; Fluid consistency: Thin  Skin:  Wound (see comment) (pressure injury stage II, coccyx)  Last BM:  03/06/17  Height:   Ht Readings from Last 1 Encounters:  03/07/17 5\' 7"  (1.702 m)    Weight:   Wt Readings from Last 1 Encounters:  03/07/17 132 lb 7.9 oz (60.1 kg)    Ideal Body Weight:  61.4 kg  BMI:  Body mass index is 20.75 kg/m.  Estimated Nutritional Needs:   Kcal:   1200-1400 kcals  Protein:  60-70 grams of protein  Fluid:  >/= 2 L  EDUCATION NEEDS:   No education needs identified at this time  Binta Toellner [facility] Dietetic Intern Pager: [phone] 03/07/2017 1:43 PM

## 2017-03-08 DIAGNOSIS — M545 Low back pain: Secondary | ICD-10-CM

## 2017-03-08 DIAGNOSIS — J8410 Pulmonary fibrosis, unspecified: Secondary | ICD-10-CM

## 2017-03-08 DIAGNOSIS — N179 Acute kidney failure, unspecified: Secondary | ICD-10-CM

## 2017-03-08 LAB — BASIC METABOLIC PANEL
ANION GAP: 15 (ref 5–15)
BUN: 38 mg/dL — ABNORMAL HIGH (ref 6–20)
CHLORIDE: 106 mmol/L (ref 101–111)
CO2: 18 mmol/L — AB (ref 22–32)
Calcium: 8.8 mg/dL — ABNORMAL LOW (ref 8.9–10.3)
Creatinine, Ser: 2.15 mg/dL — ABNORMAL HIGH (ref 0.44–1.00)
GFR calc Af Amer: 23 mL/min — ABNORMAL LOW (ref >60)
GFR calc non Af Amer: 20 mL/min — ABNORMAL LOW (ref >60)
GLUCOSE: 158 mg/dL — AB (ref 65–99)
POTASSIUM: 5.5 mmol/L — AB (ref 3.5–5.1)
Sodium: 139 mmol/L (ref 135–145)

## 2017-03-08 LAB — CBC WITH DIFFERENTIAL/PLATELET
BASOS ABS: 0.1 10*3/uL (ref 0.0–0.1)
Basophils Relative: 1 %
Eosinophils Absolute: 0 10*3/uL (ref 0.0–0.7)
Eosinophils Relative: 0 %
HEMATOCRIT: 47.3 % — AB (ref 36.0–46.0)
HEMOGLOBIN: 15.6 g/dL — AB (ref 12.0–15.0)
LYMPHS PCT: 15 %
Lymphs Abs: 1.9 10*3/uL (ref 0.7–4.0)
MCH: 33.3 pg (ref 26.0–34.0)
MCHC: 33 g/dL (ref 30.0–36.0)
MCV: 100.9 fL — AB (ref 78.0–100.0)
MONO ABS: 1.3 10*3/uL — AB (ref 0.1–1.0)
Monocytes Relative: 10 %
NEUTROS PCT: 74 %
Neutro Abs: 9.3 10*3/uL — ABNORMAL HIGH (ref 1.7–7.7)
Platelets: 193 10*3/uL (ref 150–400)
RBC: 4.69 MIL/uL (ref 3.87–5.11)
RDW: 16.8 % — AB (ref 11.5–15.5)
WBC: 12.6 10*3/uL — ABNORMAL HIGH (ref 4.0–10.5)

## 2017-03-08 LAB — PROTIME-INR
INR: 2.47
Prothrombin Time: 26.6 seconds — ABNORMAL HIGH (ref 11.4–15.2)

## 2017-03-08 MED ORDER — TRAMADOL HCL 50 MG PO TABS
50.0000 mg | ORAL_TABLET | Freq: Two times a day (BID) | ORAL | Status: DC | PRN
  Administered 2017-03-08 – 2017-03-11 (×4): 50 mg via ORAL
  Filled 2017-03-08 (×4): qty 1

## 2017-03-08 MED ORDER — FUROSEMIDE 10 MG/ML IJ SOLN: 20.0000 mg | Freq: Every day | INTRAMUSCULAR | Status: DC

## 2017-03-08 NOTE — Evaluation (Signed)
Occupational Therapy Evaluation Patient Details Name: [name] MRN: [id number] DOB: 14-Mar-1932 Today's Date: 03/08/2017    History of Present Illness 83 y.o. female with medical history significant of hyperlipidemia, chronic respiratory failure on 2-4 liter oxygen at home, hypothyroidism, OSA not on CPAP, left bundle blockage, idiopathic pulmonary fibrosis, atrial fibrillation on Coumadin, sCHF with EF of 35%, who presents with shortness of breath   Clinical Impression   Pt was admitted for the above. She is very limited by back pain but was agreeable to sitting up to increase strength.  Pt has a very poor appetite. She was independent with adls prior to admission; she now needs up to total A because of pain. Will follow in acute setting with min guard to min A level goals    Follow Up Recommendations  SNF (if agreeable)    Equipment Recommendations  3 in 1 bedside commode    Recommendations for Other Services       Precautions / Restrictions Precautions Precautions: Back;Fall Restrictions Weight Bearing Restrictions: No      Mobility Bed Mobility Overal bed mobility: Needs Assistance     Sidelying to sit: Min assist;+2 for physical assistance     Sit to sidelying: Mod assist;+2 for safety/equipment General bed mobility comments: assist for trunk and legs  Transfers Overall transfer level: Needs assistance               General transfer comment: did not transfer with therapy    Balance                                           ADL either performed or assessed with clinical judgement   ADL Overall ADL's : Needs assistance/impaired Eating/Feeding: Set up;Sitting   Grooming: Bed level;Set up   Upper Body Bathing: Set up;Bed level   Lower Body Bathing: Total assistance;Bed level   Upper Body Dressing : Moderate assistance;Bed level   Lower Body Dressing: Total assistance;Bed level                 General ADL Comments: pt  did not transfer at this time; sat eob x 20 minutes with support most of the time due to pain. Sitting upright at 90 is very uncomfortable.  Only ate one bite of food and drank a couple sips of milk     Vision         Perception     Praxis      Pertinent Vitals/Pain Pain Assessment: Faces Faces Pain Scale: Hurts whole lot Pain Location: back Pain Descriptors / Indicators: Aching Pain Intervention(s): Limited activity within patient's tolerance;Monitored during session;Premedicated before session;Repositioned     Hand Dominance     Extremity/Trunk Assessment Upper Extremity Assessment Upper Extremity Assessment: Generalized weakness           Communication Communication Communication: No difficulties   Cognition Arousal/Alertness: Awake/alert Behavior During Therapy: WFL for tasks assessed/performed Overall Cognitive Status: Within Functional Limits for tasks assessed                                     General Comments  on 5 liters 02; sats wfls    Exercises     Shoulder Instructions      Home Living Family/patient expects to be discharged to:: Unsure Living Arrangements: Spouse/significant  other                                      Prior Functioning/Environment Level of Independence: Independent with assistive device(s)        Comments: mostly uses w/c, amb household distances        OT Problem List: Decreased strength;Decreased activity tolerance;Impaired balance (sitting and/or standing);Pain;Decreased knowledge of use of DME or AE;Cardiopulmonary status limiting activity      OT Treatment/Interventions: Self-care/ADL training;DME and/or AE instruction;Patient/family education;Balance training;Therapeutic activities    OT Goals(Current goals can be found in the care plan section) Acute Rehab OT Goals Patient Stated Goal: pain to go away OT Goal Formulation: With patient Time For Goal Achievement: 03/15/17 Potential  to Achieve Goals: Fair ADL Goals Pt Will Transfer to Toilet: with min guard assist;stand pivot transfer;bedside commode Pt Will Perform Toileting - Clothing Manipulation and hygiene: with min assist;sit to/from stand Additional ADL Goal #1: pt will perform UB adls with set up sitting and LB adls with min A, using AE, sit to stand  OT Frequency: Min 2X/week   Barriers to D/C:            Co-evaluation PT/OT/SLP Co-Evaluation/Treatment: Yes Reason for Co-Treatment: For patient/therapist safety PT goals addressed during session: Mobility/safety with mobility;Balance OT goals addressed during session: ADL's and self-care      AM-PAC PT "6 Clicks" Daily Activity     Outcome Measure Help from another person eating meals?: A Little Help from another person taking care of personal grooming?: A Little Help from another person toileting, which includes using toliet, bedpan, or urinal?: A Lot Help from another person bathing (including washing, rinsing, drying)?: A Lot Help from another person to put on and taking off regular upper body clothing?: A Lot Help from another person to put on and taking off regular lower body clothing?: A Lot 6 Click Score: 14   End of Session    Activity Tolerance: Patient limited by pain Patient left: in bed;with call bell/phone within reach;with bed alarm set  OT Visit Diagnosis: Muscle weakness (generalized) (M62.81)                Time: 8592-9244 OT Time Calculation (min): 36 min Charges:  OT General Charges $OT Visit: 1 Visit OT Evaluation $OT Eval Low Complexity: 1 Low G-Codes:     [place], OTR/L [phone] 03/08/2017  Derry Arbogast 03/08/2017, 3:40 PM

## 2017-03-08 NOTE — Evaluation (Signed)
Physical Therapy Evaluation Patient Details Name: TAMANTHA SALINE MRN: [id number] DOB: 22-Dec-1931 Today's Date: 03/08/2017   History of Present Illness  83 y.o. female with medical history significant of hyperlipidemia, chronic respiratory failure on 2-4 liter oxygen at home, hypothyroidism, OSA not on CPAP, left bundle blockage, idiopathic pulmonary fibrosis, atrial fibrillation on Coumadin, sCHF with EF of 35%, who presents with shortness of breath    Clinical Impression  On eval, pt required Mod assist +2 for bed mobility. She sat EOB for at least 15 minutes with Min-Mod assist mostly. She briefly sat for ~1 minutes without external assist. Mobility is limited by pain and dyspnea. Pt remained on 5L [place] O2 during session; dyspnea 3/4 with minimal activity. At this time, recommendation is for SNF, if pt will agree. Will follow and progress activity as pt is able to tolerate.     Follow Up Recommendations SNF    Equipment Recommendations  None recommended by PT    Recommendations for Other Services       Precautions / Restrictions Precautions Precautions: Fall;Back Precaution Comments: rolling for comfort (as pt will allow) Restrictions Weight Bearing Restrictions: No      Mobility  Bed Mobility Overal bed mobility: Needs Assistance     Sidelying to sit: Min assist;+2 for physical assistance     Sit to sidelying: Mod assist;+2 for safety/equipment General bed mobility comments: assist for trunk and legs. Increased time.   Transfers Overall transfer level: Needs assistance               General transfer comment: did not transfer with therapy. Pt unable on today  Ambulation/Gait                Stairs            Wheelchair Mobility    Modified Rankin (Stroke Patients Only)       Balance Overall balance assessment: Needs assistance Sitting-balance support: Bilateral upper extremity supported;Feet supported Sitting balance-Leahy Scale:  Poor Sitting balance - Comments: Poor-fair static sitting balance. Pt sat EOB for at least 15 minutes-most of which she sat propped/leaning on therapist                                     Pertinent Vitals/Pain Pain Assessment: Faces Faces Pain Scale: Hurts whole lot Pain Location: back Pain Descriptors / Indicators: Aching;Sore Pain Intervention(s): Limited activity within patient's tolerance;Repositioned    Home Living Family/patient expects to be discharged to:: Unsure Living Arrangements: Spouse/significant other                    Prior Function Level of Independence: Independent with assistive device(s)         Comments: mostly uses w/c, amb household distances     Hand Dominance        Extremity/Trunk Assessment   Upper Extremity Assessment Upper Extremity Assessment: Defer to OT evaluation    Lower Extremity Assessment Lower Extremity Assessment:  (unable to assess due to pain)    Cervical / Trunk Assessment Cervical / Trunk Assessment: Normal  Communication   Communication: No difficulties  Cognition Arousal/Alertness: Awake/alert Behavior During Therapy: WFL for tasks assessed/performed Overall Cognitive Status: Within Functional Limits for tasks assessed  General Comments General comments (skin integrity, edema, etc.): on 5 liters 02; sats wfls    Exercises     Assessment/Plan    PT Assessment Patient needs continued PT services  PT Problem List Decreased strength;Decreased mobility;Decreased activity tolerance;Decreased balance;Decreased knowledge of use of DME;Pain       PT Treatment Interventions DME instruction;Therapeutic activities;Therapeutic exercise;Patient/family education;Balance training;Gait training;Functional mobility training    PT Goals (Current goals can be found in the Care Plan section)  Acute Rehab PT Goals Patient Stated Goal: pain to go  away PT Goal Formulation: With patient Time For Goal Achievement: 03/22/17 Potential to Achieve Goals: Fair    Frequency Min 3X/week   Barriers to discharge        Co-evaluation   Reason for Co-Treatment: For patient/therapist safety PT goals addressed during session: Mobility/safety with mobility OT goals addressed during session: ADL's and self-care       AM-PAC PT "6 Clicks" Daily Activity  Outcome Measure Difficulty turning over in bed (including adjusting bedclothes, sheets and blankets)?: Unable Difficulty moving from lying on back to sitting on the side of the bed? : Unable Difficulty sitting down on and standing up from a chair with arms (e.g., wheelchair, bedside commode, etc,.)?: Unable Help needed moving to and from a bed to chair (including a wheelchair)?: Total Help needed walking in hospital room?: Total Help needed climbing 3-5 steps with a railing? : Total 6 Click Score: 6    End of Session   Activity Tolerance: Patient limited by pain;Patient limited by fatigue Patient left: in bed;with call bell/phone within reach;with bed alarm set   PT Visit Diagnosis: Muscle weakness (generalized) (M62.81);Difficulty in walking, not elsewhere classified (R26.2)    Time: 9292-4462 PT Time Calculation (min) (ACUTE ONLY): 26 min   Charges:   PT Evaluation $PT Eval Moderate Complexity: 1 Mod     PT G Codes:          Rebeca Alert, MPT Pager: [phone]

## 2017-03-08 NOTE — Progress Notes (Signed)
[facility] PROGRESS NOTE  [name] KYH:[id number] DOB: 02/13/1932 DOA: 03/06/2017 PCP: Pincus Sanes, MD  Interim Summary and HPI 83 y.o. female with medical history significant of hypothyroidism; OSA not on CPAP; IPF; HLD; and afib on Coumadin presenting with back pain and worsening breathing/SOB. Patient found with increase LE swelling, elevated BNP and vascular congestion on CXR. Admitted for CHF exacerbation and further assessment/control of back pain.  Assessment/Plan: 1-acute on chronic resp failure: with vascular congestion on x-ray, elevated BNP and physical exam demonstrating increase volume status. Presentation and worsening SOB due to acute on chronic diastolic HF. -will continue IV lasix, dose adjusted given response and increase in Cr trend, (now dose down to 20 mg daily) -follow daily weights and strict intake/output (difficult as patient urinating on her) -continue heart healthy diet -patient is allergic to b-blocker and has acute exacerbation -no ACE/ARB due to renal dysfunction -patient denies CP -will follow clinical response -2-D echo in July with preserved EF and grade 1 DD  2-elevated troponin -in absence of CP or acute ischemic changes on EKG -most likely demand ischemia from CHF exacerbation, in the setting of CKD. -will continue to monitor and provide supportive care -no CP  3-A. Fib: chronically on Coumadin  -patient INR therapeutic now -will follow pharmacy rec's for coumadin dose -rate controlled.  4-acute lower back pain: -appears to be MSK in nature -worse with physical activity and movements -patient with episode of unresponsiveness while using IV narcotics -will start schedule tylenol, PRN muscle relaxer and PRN tramadol for severe pain -PT/OT ordered, recommending SNF  5-hypothyroidism  -with mild elevated TSH -Free T4 no correlating with abnormal TSH findings -most likely sick thyroid -will recommend repeat thyroid panel in 4  weeks -continue current dose of synthroid   6-chronic resp failure and hx of IPF -will continue oxygen supplementation -continue chronic prednisone use and PRN inhaler therapy  7-acute on CKD: stage 3 at baseline -most likely due to diuresis -will monitor trend -lasix dose adjusted further (now down to 20 mg daily)  8-stage 2 pressure injury on sacral region -present on admission -will provide preventive measures   Code Status: DNR. Family Communication: no family at bedside. Disposition Plan: still SOB and complaining of lower back pain.   Consultants:  None   Procedures:  See below for X-ray reports   Antibiotics:  None   HPI/Subjective: No CP, no nausea, no vomiting. Patient still complaining of SOB and using 4-5 L [place].  Objective: Vitals:   03/08/17 1447 03/08/17 2050  BP: 102/75 (!) 118/49  Pulse: (!) 118 64  Resp: 19 18  Temp: 97.7 F (36.5 C) 98.7 F (37.1 C)  SpO2: 94% 96%    Intake/Output Summary (Last 24 hours) at 03/08/17 2305 Last data filed at 03/08/17 1448  Gross per 24 hour  Intake              120 ml  Output                0 ml  Net              120 ml   Filed Weights   03/07/17 0055 03/07/17 0424 03/08/17 0500  Weight: 60 kg (132 lb 4.4 oz) 60.1 kg (132 lb 7.9 oz) 60 kg (132 lb 4.4 oz)    Exam:   General: AAOx2; no CP. Reporting lower back discomfort and still feeling SOB. Using 4L [place]  Cardiovascular: no rubs, no gallops; irregular irregular; no JVD  Respiratory: no wheezing, positive fine crackles at bases, no using accessory muscles.   Abdomen: soft, NT, ND, positive BS  Musculoskeletal: 1-2 plus edema, no cyanosis   Skin: unchanged, stage 2 pressure injury seen on sacral region; no superimposed infection or drainage appreciated. Injury on skin present on admission.  Data Reviewed: Basic Metabolic Panel:  Recent Labs Lab 03/06/17 2155 03/07/17 0739 03/08/17 0422  NA 140 139 139  K 4.9 4.5 5.5*  CL 106 107 106  CO2  20* 21* 18*  GLUCOSE 139* 127* 158*  BUN 26* 28* 38*  CREATININE 1.51* 1.61* 2.15*  CALCIUM 8.7* 8.7* 8.8*   Liver Function Tests:  Recent Labs Lab 03/06/17 2155  AST 34  ALT 21  ALKPHOS 59  BILITOT 1.6*  PROT 6.4*  ALBUMIN 3.0*   CBC:  Recent Labs Lab 03/06/17 2155 03/07/17 0739 03/07/17 2346  WBC 17.3* 16.1* 12.6*  NEUTROABS 13.6*  --  9.3*  HGB 15.7* 15.7* 15.6*  HCT 47.0* 47.0* 47.3*  MCV 99.8 99.4 100.9*  PLT 211 189 193   Cardiac Enzymes:  Recent Labs Lab 03/06/17 2155 03/07/17 0138 03/07/17 0739 03/07/17 1156  TROPONINI 0.33* 0.25* 0.29* 0.26*   BNP (last 3 results)  Recent Labs  12/14/16 2033 03/06/17 2155  BNP 1,958.3* 2,693.7*    ProBNP (last 3 results)  Recent Labs  09/11/16 1455 01/29/17 1432  PROBNP 914.0* 1,247.0*    CBG:  Recent Labs Lab 03/07/17 0638  GLUCAP 127*    Studies: No results found.  Scheduled Meds: . acetaminophen  650 mg Oral TID  . feeding supplement (ENSURE ENLIVE)  237 mL Oral BID BM  . [START ON 03/09/2017] furosemide  20 mg Intravenous Daily  . levothyroxine  50 mcg Oral QAC breakfast  . mouth rinse  15 mL Mouth Rinse BID  . predniSONE  20 mg Oral Q breakfast  . sodium chloride flush  3 mL Intravenous Q12H  . Warfarin - Pharmacist Dosing Inpatient   Does not apply q1800   Continuous Infusions: . sodium chloride      Principal Problem:   Acute on chronic respiratory failure with hypoxia (HCC) Active Problems:   Hypothyroidism   Atrial fibrillation (HCC)   PULMONARY FIBROSIS   Anticoagulant long-term use   CKD (chronic kidney disease), stage III (HCC)   Hyperglycemia   Acute on chronic systolic (congestive) heart failure (HCC)   Elevated troponin   Back pain    Time spent: 35 minutes.    Vassie Loll  [facility] Pager [phone]. If 7PM-7AM, please contact night-coverage at [email or web address], password [facility] 03/08/2017, 11:05 PM  LOS: 2 days

## 2017-03-08 NOTE — Progress Notes (Addendum)
ANTICOAGULATION CONSULT NOTE   Pharmacy Consult for warfarin Indication: atrial fibrillation  Allergies  Allergen Reactions  . Dilaudid [Hydromorphone]     Patient went unresponsive  . Doxycycline Nausea Only  . Levaquin [Levofloxacin In D5w]     REACTION: leg weakness  . Amoxicillin     REACTION: upset stomach  . Lisinopril     cough  . Metoprolol     diarrhea    Patient Measurements: Height: 5\' 7"  (170.2 cm) Weight: 132 lb 4.4 oz (60 kg) IBW/kg (Calculated) : 61.6   Vital Signs: Temp: 97.6 F (36.4 C) (10/05 0500) Temp Source: Axillary (10/05 0500) BP: 110/73 (10/05 0500) Pulse Rate: 98 (10/05 0500)  Labs:  Recent Labs  03/06/17 2155 03/07/17 0138 03/07/17 0739 03/07/17 1156 03/07/17 2346 03/08/17 0422  HGB 15.7*  --  15.7*  --  15.6*  --   HCT 47.0*  --  47.0*  --  47.3*  --   PLT 211  --  189  --  193  --   LABPROT 17.0*  --   --   --   --  26.6*  INR 1.39  --   --   --   --  2.47  CREATININE 1.51*  --  1.61*  --   --  2.15*  TROPONINI 0.33* 0.25* 0.29* 0.26*  --   --     Estimated Creatinine Clearance: 18.8 mL/min (A) (by C-G formula based on SCr of 2.15 mg/dL (H)).   Infusions:  . sodium chloride      Assessment: 85 yoF c/o back pain on chronic warfarin for A-fib. INR=1.39 on admission.  Home dose is 5 mg M/F and 2.5 mg other days.  Last dose was 10/1.  Recent INRs as outpatient have been SUPRAtherapeutic.    Today, 03/08/2017  INR therapeutic this morning  CBC: Hgb inc, pltc WNL 10/4  Heart healthy diet but intake appears minimal. Per RN, did eat good breakfast 10/5  No major drug interactions, steroids can increase effect but prednisone was home dose  Goal of Therapy:  INR 2-3   Plan:   NO warfarin tonight for rate of INR rise  Suspect may need less warfarin than stated in home regimen as INR has most recently been elevated at warfarin clinic.   PO intake also appears decreased  Daily INR  Juliette Alcide, PharmD, BCPS.    Pager: [phone] 03/08/2017 8:35 AM

## 2017-03-09 LAB — BASIC METABOLIC PANEL
Anion gap: 13 (ref 5–15)
BUN: 51 mg/dL — AB (ref 6–20)
CO2: 22 mmol/L (ref 22–32)
CREATININE: 2.6 mg/dL — AB (ref 0.44–1.00)
Calcium: 8.6 mg/dL — ABNORMAL LOW (ref 8.9–10.3)
Chloride: 105 mmol/L (ref 101–111)
GFR calc Af Amer: 18 mL/min — ABNORMAL LOW (ref >60)
GFR, EST NON AFRICAN AMERICAN: 16 mL/min — AB (ref >60)
GLUCOSE: 99 mg/dL (ref 65–99)
POTASSIUM: 5.5 mmol/L — AB (ref 3.5–5.1)
SODIUM: 140 mmol/L (ref 135–145)

## 2017-03-09 LAB — GLUCOSE, CAPILLARY
GLUCOSE-CAPILLARY: 71 mg/dL (ref 65–99)
Glucose-Capillary: 98 mg/dL (ref 65–99)

## 2017-03-09 LAB — PROTIME-INR
INR: 3.07
PROTHROMBIN TIME: 31.5 s — AB (ref 11.4–15.2)

## 2017-03-09 MED ORDER — LIDOCAINE 5 % EX PTCH
1.0000 | MEDICATED_PATCH | CUTANEOUS | Status: DC
  Administered 2017-03-09 – 2017-03-12 (×4): 1 via TRANSDERMAL
  Filled 2017-03-09 (×4): qty 1

## 2017-03-09 MED ORDER — SODIUM CHLORIDE 0.9 % IV SOLN
1.0000 g | Freq: Once | INTRAVENOUS | Status: AC
  Administered 2017-03-09: 1 g via INTRAVENOUS
  Filled 2017-03-09: qty 10

## 2017-03-09 NOTE — Progress Notes (Signed)
[facility] PROGRESS NOTE  [name] AOZ:[id number] DOB: 1931-11-17 DOA: 03/06/2017 PCP: Pincus Sanes, MD  Interim Summary and HPI 83 y.o. female with medical history significant of hypothyroidism; OSA not on CPAP; IPF; HLD; and afib on Coumadin presenting with back pain and worsening breathing/SOB. Patient found with increase LE swelling, elevated BNP and vascular congestion on CXR. Admitted for CHF exacerbation and further assessment/control of back pain.  Assessment/Plan: 1-acute on chronic resp failure: with vascular congestion on x-ray, elevated BNP and physical exam demonstrating increase volume status. Presentation and worsening SOB due to acute on chronic diastolic HF. -will hold diuretics and watch; breathing improved. Renal function up. -follow daily weights and strict intake/output (difficult as patient urinating on her) -continue heart healthy diet -patient is allergic to b-blocker and has acute exacerbation -no ACE/ARB due to renal dysfunction -patient denies CP -2-D echo in July with preserved EF and grade 1 DD  2-elevated troponin -in absence of CP or acute ischemic changes on EKG; most likely demand ischemia from CHF exacerbation, in the setting of CKD. -will continue to monitor and provide supportive care -remains chest pain free.  3-A. Fib: chronically on Coumadin  -patient INR therapeutic and stable now -will follow pharmacy rec's for coumadin dose; will monitor -rate controlled.  4-acute lower back pain: -appears to be MSK in nature -worse with physical activity and movements -patient with episode of unresponsiveness while using IV narcotics -will start schedule tylenol, PRN muscle relaxer and PRN tramadol for severe pain -PT/OT ordered, recommending SNF -will add lidoderm patch  5-hypothyroidism  -with mild elevated TSH -Free T4 no correlating with abnormal TSH findings -most likely sick thyroid -will recommend repeat thyroid panel in 4  weeks -continue synthroid at current dose.  6-chronic resp failure and hx of IPF -will continue oxygen supplementation -continue chronic prednisone use and PRN inhaler therapy -breathing stable now.  7-acute on CKD: stage 3 at baseline -most likely due to diuresis -will monitor trend; Cr even higher today -patient reported increase in urine output -will hold lasix -follow strict I's and O's.  8-stage 2 pressure injury on sacral region -present on admission -will provide preventive measures    Code Status: DNR. Family Communication: daughter at bedside. Disposition Plan: with improvement in breathing, still complaining of back pain. Renal function worsen.   Consultants:  None   Procedures:  See below for X-ray reports   Antibiotics:  None   HPI/Subjective: No CP, no fever, no nausea, no vomiting. AAOX3 currently. Reporting improvement in her breathing.  Objective: Vitals:   03/09/17 1049 03/09/17 1940  BP: 112/60 104/65  Pulse: 60 (!) 116  Resp: 18 19  Temp:  97.7 F (36.5 C)  SpO2: 96% 94%    Intake/Output Summary (Last 24 hours) at 03/09/17 2141 Last data filed at 03/09/17 1809  Gross per 24 hour  Intake                0 ml  Output              150 ml  Net             -150 ml   Filed Weights   03/07/17 0424 03/08/17 0500 03/09/17 0606  Weight: 60.1 kg (132 lb 7.9 oz) 60 kg (132 lb 4.4 oz) 59.1 kg (130 lb 3.2 oz)    Exam:   General: AAOX3, no CP, no nausea, no vomiting. reporting improvement in her breathing and denying CP. Continue struggling with lower back  pain.   Cardiovascular: no rubs, no gallops, irregular irregular, no JVD seen on exam.  Respiratory: improved air movement, no wheezing, no frank crackles.  Abdomen: soft, NT, ND, positive BS  Musculoskeletal: 11++  Edema, no cyanosis  Skin: unchanged, stage 2 pressure injury seen on sacral region; no superimposed infection or drainage appreciated. Injury on skin present on  admission.  Data Reviewed: Basic Metabolic Panel:  Recent Labs Lab 03/06/17 2155 03/07/17 0739 03/08/17 0422 03/09/17 0516  NA 140 139 139 140  K 4.9 4.5 5.5* 5.5*  CL 106 107 106 105  CO2 20* 21* 18* 22  GLUCOSE 139* 127* 158* 99  BUN 26* 28* 38* 51*  CREATININE 1.51* 1.61* 2.15* 2.60*  CALCIUM 8.7* 8.7* 8.8* 8.6*   Liver Function Tests:  Recent Labs Lab 03/06/17 2155  AST 34  ALT 21  ALKPHOS 59  BILITOT 1.6*  PROT 6.4*  ALBUMIN 3.0*   CBC:  Recent Labs Lab 03/06/17 2155 03/07/17 0739 03/07/17 2346  WBC 17.3* 16.1* 12.6*  NEUTROABS 13.6*  --  9.3*  HGB 15.7* 15.7* 15.6*  HCT 47.0* 47.0* 47.3*  MCV 99.8 99.4 100.9*  PLT 211 189 193   Cardiac Enzymes:  Recent Labs Lab 03/06/17 2155 03/07/17 0138 03/07/17 0739 03/07/17 1156  TROPONINI 0.33* 0.25* 0.29* 0.26*   BNP (last 3 results)  Recent Labs  12/14/16 2033 03/06/17 2155  BNP 1,958.3* 2,693.7*    ProBNP (last 3 results)  Recent Labs  09/11/16 1455 01/29/17 1432  PROBNP 914.0* 1,247.0*    CBG:  Recent Labs Lab 03/07/17 0638 03/09/17 0748 03/09/17 0749  GLUCAP 127* 98 71    Studies: No results found.  Scheduled Meds: . acetaminophen  650 mg Oral TID  . feeding supplement (ENSURE ENLIVE)  237 mL Oral BID BM  . levothyroxine  50 mcg Oral QAC breakfast  . lidocaine  1 patch Transdermal Q24H  . mouth rinse  15 mL Mouth Rinse BID  . predniSONE  20 mg Oral Q breakfast  . sodium chloride flush  3 mL Intravenous Q12H  . Warfarin - Pharmacist Dosing Inpatient   Does not apply q1800   Continuous Infusions: . sodium chloride      Principal Problem:   Acute on chronic respiratory failure with hypoxia (HCC) Active Problems:   Hypothyroidism   Atrial fibrillation (HCC)   PULMONARY FIBROSIS   Anticoagulant long-term use   CKD (chronic kidney disease), stage III (HCC)   Hyperglycemia   Acute on chronic systolic (congestive) heart failure (HCC)   Elevated troponin   Back  pain    Time spent: 30 minutes.    Vassie Loll  [facility] Pager [phone]. If 7PM-7AM, please contact night-coverage at [email or web address], password [facility] 03/09/2017, 9:41 PM  LOS: 3 days

## 2017-03-09 NOTE — Clinical Social Work Note (Signed)
Clinical Social Work Assessment  Patient Details  Name: [name] MRN: [id number] Date of Birth: 02-Aug-1931  Date of referral:  03/09/17               Reason for consult:  Facility Placement, Discharge Planning                Permission sought to share information with:  Case Manager, Magazine features editor, Family Supports Permission granted to share information::  Yes, Verbal Permission Granted  Name::        Agency::     Relationship::  Family at bedside throughout assessment (daughter)  Solicitor Information:     Housing/Transportation Living arrangements for the past 2 months:  Single Family Home Source of Information:  Patient, Medical Team, Case Manager, Adult Children, Spouse Patient Interpreter Needed:  None Criminal Activity/Legal Involvement Pertinent to Current Situation/Hospitalization:  No - Comment as needed Significant Relationships:  Adult Children, Other Family Members, Spouse Lives with:  Spouse Do you feel safe going back to the place where you live?  Yes Need for family participation in patient care:  Yes (Comment)  Care giving concerns:  Patient admitted to [facility] from home with her husband.  She admits with compliant of back pain and Acute on chronic respiratory failure with hypoxia.  Patient wears oxygen at home and primarily  independent with assistive devices/wheelchair.  Patient has worked with therapies, but tires easily and has refused to work the day before due to being tired.  Patient at this time is alert and oriented in bed. She engages with me during assessment and listens mostly to role of SW and family concerns. Family reports patient to have GOC meeting per MD with unknown outcomes at this time regarding disposition. They are interested in SNF [facility]) as she was just placed at facility from 7/30-8/28 using all 20 days and more/copay days.  Patient has not had her sixty days of wellness at this time, however family feels  certain that patient has meet her deductibles.  LCSW will have to explore and this will be done Monday.  Patient reports she would be fine to go back to [place], but daughter would like alternative options pending GOC meeting. They would like to wait on being faxed out until GOC meeting has been completed. They are also aware there are no beds at [facility] until later in the week per facility due to no planned discharges.    Social Worker assessment / plan:  Assessment and consult completed. LCSW spent most of the time explaining role and support for family.  Family reported they are appreciative of consult and time as last admission they felt rushed for planning and this time it allows for more time.  LCSW will have unit CSW follow up on Monday with regards to outcome of GOC and SNF. Will also find out if patient is in co-pay days regarding disposition to SNF if warranted and if there is any cost.  If Hospice or palliative is recommended at facility. Will discuss with family how they want to continue with services and payment.  WIll follow up.  Plan: TBD.  Employment status:  Retired Database administrator PT Recommendations:  Skilled Nursing Facility Information / Referral to community resources:  Skilled Nursing Facility  Patient/Family's Response to care:  Understanding and unclear regarding course of care. The family wants to take it day by day.  Patient/Family's Understanding of and Emotional Response to Diagnosis, Current Treatment, and  Prognosis:  Family voices feeling overwhelmed with plans to be made and current unknown of how patient progresses. All very involved and appreciative of time spent explaining different options. Have asked to hold referrals until Monday (if possible) and plan for GOC meeting and decide after.  Emotional Assessment Appearance:  Appears stated age Attitude/Demeanor/Rapport:  Guarded Affect (typically observed):  Blunt,  Guarded Orientation:  Oriented to Self, Oriented to Place, Oriented to  Time, Oriented to Situation Alcohol / Substance use:  Not Applicable Psych involvement (Current and /or in the community):  No (Comment)  Discharge Needs  Concerns to be addressed:  Care Coordination, Discharge Planning Concerns, Financial / Insurance Concerns Readmission within the last 30 days:  No Current discharge risk:  None Barriers to Discharge:  Continued Medical Work up   Raye Sorrow, LCSW 03/09/2017, 2:49 PM

## 2017-03-09 NOTE — Progress Notes (Signed)
ANTICOAGULATION CONSULT NOTE   Pharmacy Consult for warfarin Indication: atrial fibrillation  Allergies  Allergen Reactions  . Dilaudid [Hydromorphone]     Patient went unresponsive  . Doxycycline Nausea Only  . Levaquin [Levofloxacin In D5w]     REACTION: leg weakness  . Amoxicillin     REACTION: upset stomach  . Lisinopril     cough  . Metoprolol     diarrhea    Patient Measurements: Height: 5\' 7"  (170.2 cm) Weight: 130 lb 3.2 oz (59.1 kg) IBW/kg (Calculated) : 61.6   Vital Signs: Temp: 97.5 F (36.4 C) (10/06 0606) Temp Source: Oral (10/06 0606) BP: 112/56 (10/06 0606) Pulse Rate: 65 (10/06 0606)  Labs:  Recent Labs  03/06/17 2155 03/07/17 0138 03/07/17 0739 03/07/17 1156 03/07/17 2346 03/08/17 0422 03/09/17 0516  HGB 15.7*  --  15.7*  --  15.6*  --   --   HCT 47.0*  --  47.0*  --  47.3*  --   --   PLT 211  --  189  --  193  --   --   LABPROT 17.0*  --   --   --   --  26.6* 31.5*  INR 1.39  --   --   --   --  2.47 3.07  CREATININE 1.51*  --  1.61*  --   --  2.15* 2.60*  TROPONINI 0.33* 0.25* 0.29* 0.26*  --   --   --     Estimated Creatinine Clearance: 15.3 mL/min (A) (by C-G formula based on SCr of 2.6 mg/dL (H)).   Infusions:  . sodium chloride      Assessment: 85 yoF c/o back pain on chronic warfarin for A-fib. INR = 1.39 on admission.  Home dose is 5 mg M/F and 2.5 mg other days.  Last dose PTA was 10/1.  Recent INRs as outpatient have been SUPRAtherapeutic.    Today, 03/09/2017  INR slightly supratherapeutic despite no warfarin yesterday.   CBC: Hgb stable, Pltc WNL 10/4  Heart healthy diet but intake appears minimal per discussion with RN.   No major drug interactions, steroids can increase effect but prednisone was home dose  Goal of Therapy:  INR 2-3   Plan:   NO warfarin tonight for supratherapeutic INR.  Suspect may need less warfarin than stated in home regimen as INR has most recently been elevated at warfarin clinic.    PO intake also appears decreased  Daily PT/INR  Monitor closely for s/sx of bleeding   Greer Pickerel, PharmD, BCPS Pager: [phone] 03/09/2017 10:36 AM

## 2017-03-10 DIAGNOSIS — R5381 Other malaise: Secondary | ICD-10-CM

## 2017-03-10 LAB — PROTIME-INR
INR: 3.55
Prothrombin Time: 35.2 seconds — ABNORMAL HIGH (ref 11.4–15.2)

## 2017-03-10 LAB — BASIC METABOLIC PANEL
Anion gap: 12 (ref 5–15)
BUN: 58 mg/dL — AB (ref 6–20)
CO2: 21 mmol/L — ABNORMAL LOW (ref 22–32)
Calcium: 8.9 mg/dL (ref 8.9–10.3)
Chloride: 106 mmol/L (ref 101–111)
Creatinine, Ser: 2.59 mg/dL — ABNORMAL HIGH (ref 0.44–1.00)
GFR calc Af Amer: 19 mL/min — ABNORMAL LOW (ref >60)
GFR, EST NON AFRICAN AMERICAN: 16 mL/min — AB (ref >60)
GLUCOSE: 113 mg/dL — AB (ref 65–99)
POTASSIUM: 5 mmol/L (ref 3.5–5.1)
Sodium: 139 mmol/L (ref 135–145)

## 2017-03-10 LAB — GLUCOSE, CAPILLARY: Glucose-Capillary: 101 mg/dL — ABNORMAL HIGH (ref 65–99)

## 2017-03-10 MED ORDER — BOOST PLUS PO LIQD
237.0000 mL | Freq: Two times a day (BID) | ORAL | Status: DC
  Administered 2017-03-10 – 2017-03-11 (×2): 237 mL via ORAL
  Filled 2017-03-10 (×5): qty 237

## 2017-03-10 NOTE — Progress Notes (Signed)
ANTICOAGULATION CONSULT NOTE   Pharmacy Consult for warfarin Indication: atrial fibrillation  Allergies  Allergen Reactions  . Dilaudid [Hydromorphone]     Patient went unresponsive  . Doxycycline Nausea Only  . Levaquin [Levofloxacin In D5w]     REACTION: leg weakness  . Amoxicillin     REACTION: upset stomach  . Lisinopril     cough  . Metoprolol     diarrhea    Patient Measurements: Height: 5\' 7"  (170.2 cm) Weight: 130 lb 8.2 oz (59.2 kg) IBW/kg (Calculated) : 61.6   Vital Signs: Temp: 97.6 F (36.4 C) (10/07 0541) Temp Source: Oral (10/07 0541) BP: 112/72 (10/07 0541) Pulse Rate: 56 (10/07 0541)  Labs:  Recent Labs  03/07/17 1156 03/07/17 2346 03/08/17 0422 03/09/17 0516 03/10/17 0452  HGB  --  15.6*  --   --   --   HCT  --  47.3*  --   --   --   PLT  --  193  --   --   --   LABPROT  --   --  26.6* 31.5* 35.2*  INR  --   --  2.47 3.07 3.55  CREATININE  --   --  2.15* 2.60* 2.59*  TROPONINI 0.26*  --   --   --   --     Estimated Creatinine Clearance: 15.4 mL/min (A) (by C-G formula based on SCr of 2.59 mg/dL (H)).   Infusions:  . sodium chloride      Assessment: 85 yoF c/o back pain on chronic warfarin for A-fib. INR = 1.39 on admission.  Home dose is 5 mg M/F and 2.5 mg other days.  Last dose PTA was 10/1.  Recent INRs as outpatient have been SUPRAtherapeutic.    Today, 03/10/2017  INR supratherapeutic and trending up despite no warfarin the past 2 days   CBC: Hgb stable, Pltc WNL 10/4  No bleeding issues reported  Heart healthy diet but intake appears minimal per discussion with RN.   No major drug interactions, steroids can increase effect but prednisone was home dose  Goal of Therapy:  INR 2-3   Plan:   NO warfarin tonight for supratherapeutic INR.  Suspect may need less warfarin than stated in home regimen as INR has most recently been elevated at warfarin clinic.   PO intake also appears decreased  Daily PT/INR  Monitor  closely for s/sx of bleeding   Greer Pickerel, PharmD, BCPS Pager: [phone] 03/10/2017 11:02 AM

## 2017-03-10 NOTE — Progress Notes (Signed)
[facility] PROGRESS NOTE  [name] NFA:[id number] DOB: 1931/12/20 DOA: 03/06/2017 PCP: Pincus Sanes, MD  Interim Summary and HPI 83 y.o. female with medical history significant of hypothyroidism; OSA not on CPAP; IPF; HLD; and afib on Coumadin presenting with back pain and worsening breathing/SOB. Patient found with increase LE swelling, elevated BNP and vascular congestion on CXR. Admitted for CHF exacerbation and further assessment/control of back pain.  Assessment/Plan: 1-acute on chronic resp failure: with vascular congestion on x-ray, elevated BNP and physical exam demonstrating increase volume status. Presentation and worsening SOB due to acute on chronic diastolic HF. -will continue holding diuretics today. Breathing stable. Renal function plateau to slightly down. -follow daily weights and strict intake/output  -continue heart healthy diet -patient is allergic to b-blocker and has acute exacerbation -no ACE/ARB due to renal dysfunction -patient denies CP -2-D echo in July with preserved EF and grade 1 DD  2-elevated troponin -in absence of CP or acute ischemic changes on EKG; most likely demand ischemia from CHF exacerbation, in the setting of CKD. -will continue to monitor and provide supportive care -remains chest pain free.  3-A. Fib: chronically on Coumadin  -patient INR supra-therapeutic; will hold coumadin today -will follow pharmacy rec's for coumadin dose; continue monitor INR (3.5 today) -rate controlled.  4-acute lower back pain: -appears to be MSK in nature -worse with physical activity and movements -patient with episode of unresponsiveness while using IV narcotics -will start schedule tylenol, PRN muscle relaxer and PRN tramadol for severe pain -PT/OT ordered, recommending SNF -will continue lidoderm patch; patient reported good response.  5-hypothyroidism  -with mild elevated TSH -Free T4 no correlating with abnormal TSH findings -most  likely sick thyroid -will recommend repeat thyroid panel in 4 weeks -continue synthroid at current dose.  6-chronic resp failure and hx of IPF -will continue oxygen supplementation -continue chronic prednisone and continue use of PRN inhaler therapy -breathing stable now.  7-acute on CKD: stage 3 at baseline -most likely due to diuresis -will continue to monitor Cr trend and continue hold diuretics/nephrotoxic agents. -urine output improved according to patient  -follow strict I's and O's.  8-stage 2 pressure injury on sacral region -present on admission -will continue preventive measures    Code Status: DNR. Family Communication: daughter at bedside. Disposition Plan: renal function stable; breathing continue to be good. According to patient with increase urine output. Back pain much better.   Consultants:  None   Procedures:  See below for X-ray reports   Antibiotics:  None   HPI/Subjective: Afebrile, CP, no nausea, no vomiting. Patient feeling weak and with poor appetite. Reporting back pain is better and also having some increase in urin output she said.  Objective: Vitals:   03/10/17 0541 03/10/17 1522  BP: 112/72 120/87  Pulse: (!) 56 (!) 126  Resp: 18 (!) 23  Temp: 97.6 F (36.4 C)   SpO2: 98% 95%    Intake/Output Summary (Last 24 hours) at 03/10/17 1543 Last data filed at 03/10/17 1400  Gross per 24 hour  Intake              150 ml  Output              350 ml  Net             -200 ml   Filed Weights   03/08/17 0500 03/09/17 0606 03/10/17 0439  Weight: 60 kg (132 lb 4.4 oz) 59.1 kg (130 lb 3.2 oz) 59.2 kg (130  lb 8.2 oz)    Exam:   General: AAOX3, no fever, no nausea, no vomiting. Reports breathing is stable and denies CP. According to patient having good urine output.  Cardiovascular: no rubs, no gallops, positive irregular irregular sounds; no JVD appreciated.  Respiratory: improved air movement, no wheezing, no frank crackles, normal  resp effort. Wearing home [place] oxygen supplementation.   Abdomen: soft, NT, ND, positive BS  Musculoskeletal: 1+ edema, bilaterally, no cyanosis. TED hose in place.  Skin: skin pressure injury unchanged; (stage 2 pressure injury seen on sacral region; no superimposed infection or drainage appreciated. Injury on skin present on admission).  Data Reviewed: Basic Metabolic Panel:  Recent Labs Lab 03/06/17 2155 03/07/17 0739 03/08/17 0422 03/09/17 0516 03/10/17 0452  NA 140 139 139 140 139  K 4.9 4.5 5.5* 5.5* 5.0  CL 106 107 106 105 106  CO2 20* 21* 18* 22 21*  GLUCOSE 139* 127* 158* 99 113*  BUN 26* 28* 38* 51* 58*  CREATININE 1.51* 1.61* 2.15* 2.60* 2.59*  CALCIUM 8.7* 8.7* 8.8* 8.6* 8.9   Liver Function Tests:  Recent Labs Lab 03/06/17 2155  AST 34  ALT 21  ALKPHOS 59  BILITOT 1.6*  PROT 6.4*  ALBUMIN 3.0*   CBC:  Recent Labs Lab 03/06/17 2155 03/07/17 0739 03/07/17 2346  WBC 17.3* 16.1* 12.6*  NEUTROABS 13.6*  --  9.3*  HGB 15.7* 15.7* 15.6*  HCT 47.0* 47.0* 47.3*  MCV 99.8 99.4 100.9*  PLT 211 189 193   Cardiac Enzymes:  Recent Labs Lab 03/06/17 2155 03/07/17 0138 03/07/17 0739 03/07/17 1156  TROPONINI 0.33* 0.25* 0.29* 0.26*   BNP (last 3 results)  Recent Labs  12/14/16 2033 03/06/17 2155  BNP 1,958.3* 2,693.7*    ProBNP (last 3 results)  Recent Labs  09/11/16 1455 01/29/17 1432  PROBNP 914.0* 1,247.0*    CBG:  Recent Labs Lab 03/07/17 0638 03/09/17 0748 03/09/17 0749 03/10/17 0731  GLUCAP 127* 98 71 101*    Studies: No results found.  Scheduled Meds: . acetaminophen  650 mg Oral TID  . lactose free nutrition  237 mL Oral BID BM  . levothyroxine  50 mcg Oral QAC breakfast  . lidocaine  1 patch Transdermal Q24H  . mouth rinse  15 mL Mouth Rinse BID  . predniSONE  20 mg Oral Q breakfast  . sodium chloride flush  3 mL Intravenous Q12H  . Warfarin - Pharmacist Dosing Inpatient   Does not apply q1800   Continuous  Infusions: . sodium chloride      Principal Problem:   Acute on chronic respiratory failure with hypoxia (HCC) Active Problems:   Hypothyroidism   Atrial fibrillation (HCC)   PULMONARY FIBROSIS   Anticoagulant long-term use   CKD (chronic kidney disease), stage III (HCC)   Hyperglycemia   Acute on chronic systolic (congestive) heart failure (HCC)   Elevated troponin   Back pain    Time spent: 30 minutes.    Vassie Loll  [facility] Pager [phone]. If 7PM-7AM, please contact night-coverage at [email or web address], password [facility] 03/10/2017, 3:43 PM  LOS: 4 days

## 2017-03-10 NOTE — Progress Notes (Signed)
No charge note:   Palliative consult received.   Left message for patient's daughter to schedule meeting time.   Ocie Bob, AGNP-C Palliative Medicine  Please call Palliative Medicine team phone with any questions [phone]. For individual providers please see AMION.

## 2017-03-10 NOTE — Progress Notes (Signed)
No charge note.  Meeting scheduled for 0930 on 10/8 with patient's family for GOC.   Ocie Bob, AGNP-C Palliative Medicine  Please call Palliative Medicine team phone with any questions [phone]. For individual providers please see AMION.

## 2017-03-10 NOTE — Progress Notes (Signed)
pt's HR sustaining in 120s while sleeping. BP stable at 120/87. MD notified.  Earnest Conroy. Clelia Croft, RN

## 2017-03-11 DIAGNOSIS — Z515 Encounter for palliative care: Secondary | ICD-10-CM

## 2017-03-11 DIAGNOSIS — I5023 Acute on chronic systolic (congestive) heart failure: Secondary | ICD-10-CM

## 2017-03-11 DIAGNOSIS — Z7189 Other specified counseling: Secondary | ICD-10-CM

## 2017-03-11 LAB — GLUCOSE, CAPILLARY: GLUCOSE-CAPILLARY: 106 mg/dL — AB (ref 65–99)

## 2017-03-11 LAB — PROTIME-INR
INR: 2.98
Prothrombin Time: 30.8 seconds — ABNORMAL HIGH (ref 11.4–15.2)

## 2017-03-11 LAB — BASIC METABOLIC PANEL
ANION GAP: 14 (ref 5–15)
BUN: 70 mg/dL — ABNORMAL HIGH (ref 6–20)
CALCIUM: 8.6 mg/dL — AB (ref 8.9–10.3)
CO2: 21 mmol/L — AB (ref 22–32)
Chloride: 101 mmol/L (ref 101–111)
Creatinine, Ser: 2.71 mg/dL — ABNORMAL HIGH (ref 0.44–1.00)
GFR, EST AFRICAN AMERICAN: 18 mL/min — AB (ref >60)
GFR, EST NON AFRICAN AMERICAN: 15 mL/min — AB (ref >60)
Glucose, Bld: 110 mg/dL — ABNORMAL HIGH (ref 65–99)
Potassium: 5 mmol/L (ref 3.5–5.1)
Sodium: 136 mmol/L (ref 135–145)

## 2017-03-11 LAB — CBC
HCT: 46.9 % — ABNORMAL HIGH (ref 36.0–46.0)
Hemoglobin: 15.4 g/dL — ABNORMAL HIGH (ref 12.0–15.0)
MCH: 33 pg (ref 26.0–34.0)
MCHC: 32.8 g/dL (ref 30.0–36.0)
MCV: 100.6 fL — ABNORMAL HIGH (ref 78.0–100.0)
Platelets: 180 10*3/uL (ref 150–400)
RBC: 4.66 MIL/uL (ref 3.87–5.11)
RDW: 17.2 % — ABNORMAL HIGH (ref 11.5–15.5)
WBC: 13.7 10*3/uL — ABNORMAL HIGH (ref 4.0–10.5)

## 2017-03-11 MED ORDER — LEVALBUTEROL HCL 0.63 MG/3ML IN NEBU: 0.6300 mg | INHALATION_SOLUTION | Freq: Three times a day (TID) | RESPIRATORY_TRACT | Status: DC | PRN

## 2017-03-11 MED ORDER — MORPHINE SULFATE (CONCENTRATE) 10 MG/0.5ML PO SOLN
2.5000 mg | ORAL | Status: DC | PRN
  Administered 2017-03-11 – 2017-03-12 (×8): 2.6 mg via SUBLINGUAL
  Filled 2017-03-11 (×8): qty 0.5

## 2017-03-11 MED ORDER — WARFARIN SODIUM 2.5 MG PO TABS
2.5000 mg | ORAL_TABLET | Freq: Once | ORAL | Status: AC
  Administered 2017-03-11: 2.5 mg via ORAL
  Filled 2017-03-11: qty 1

## 2017-03-11 NOTE — Progress Notes (Signed)
ANTICOAGULATION CONSULT NOTE   Pharmacy Consult for warfarin Indication: atrial fibrillation  Allergies  Allergen Reactions  . Dilaudid [Hydromorphone]     Patient went unresponsive  . Doxycycline Nausea Only  . Levaquin [Levofloxacin In D5w]     REACTION: leg weakness  . Amoxicillin     REACTION: upset stomach  . Lisinopril     cough  . Metoprolol     diarrhea    Patient Measurements: Height: 5\' 7"  (170.2 cm) Weight: 128 lb 12 oz (58.4 kg) IBW/kg (Calculated) : 61.6   Vital Signs: Temp: 97.8 F (36.6 C) (10/08 0520) Temp Source: Oral (10/08 0520) BP: 142/90 (10/08 0520) Pulse Rate: 87 (10/08 0520)  Labs:  Recent Labs  03/09/17 0516 03/10/17 0452 03/11/17 0506  HGB  --   --  15.4*  HCT  --   --  46.9*  PLT  --   --  180  LABPROT 31.5* 35.2* 30.8*  INR 3.07 3.55 2.98  CREATININE 2.60* 2.59* 2.71*    Estimated Creatinine Clearance: 14.5 mL/min (A) (by C-G formula based on SCr of 2.71 mg/dL (H)).   Infusions:  . sodium chloride      Assessment: 85 yoF c/o back pain on chronic warfarin for A-fib. INR = 1.39 on admission.  Home dose is 5 mg M/F and 2.5 mg other days.  Last dose PTA was 10/1.  Recent INRs as outpatient have been SUPRAtherapeutic.    Today, 03/11/2017  INR high end of therapeutic, now trending down after peaking yesterday   CBC: Hgb stable, Pltc WNL  No bleeding issues reported  Heart healthy diet but intake appears minimal per discussion with RN.   No major drug interactions, steroids can increase effect but prednisone was home dose  Goal of Therapy:  INR 2-3   Plan:   Warfarin 2.5 mg tonight as poor PO intake recently and recent high INRs on home regimen - suspect may need lower maintenance dose (possibly d/t prednisone if started recently)  Daily PT/INR  Monitor closely for s/sx of bleeding   Bernadene Person, PharmD, BCPS Pager: [phone] 03/11/2017, 7:53 AM

## 2017-03-11 NOTE — Consult Note (Signed)
Consultation Note Date: 03/11/2017   Patient Name: [name]  DOB: 05/03/1932  MRN: [id number]  Age / Sex: 83 y.o., female  PCP: Binnie Rail, MD Referring Physician: Barton Dubois, MD  Reason for Consultation: Establishing goals of care  HPI/Patient Profile: 83 y.o. female  with past medical history of IPF, OSA not on CPAP, HLD, afib on coumadin admitted on 03/06/2017 with back pain, SOB, and lower extremity edema. Workup reveals CHF exacerbation in setting of pulmonary fibrosis (pulmonary edema on chest xray) with elevated troponin likely due to demand ischemia, acute on chronic kidney injury (Cr trending up, diuretics on hold today with some worsening of patient's breathing. Palliative medicine consulted for [place].   Clinical Assessment and Goals of Care:  I have reviewed medical records including EPIC notes, labs and imaging,  assessed the patient and then met at the bedside along with patient's spouse, daughter and son-in-law to discuss diagnosis prognosis, GOC, EOL wishes, disposition and options.  I introduced Palliative Medicine as specialized medical care for people living with serious illness. It focuses on providing relief from the symptoms and stress of a serious illness. The goal is to improve quality of life for both the patient and the family.  We discussed a brief life review of the patient. 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 She has lived in [place] most of her life. Her daughter also lives in [place].   As far as functional and nutritional status- she has had a significant decline in the last few months. She was admitted to the hospital in July for a CHF exacerbation and was discharged to rehab. From there she was home for about 30 days before this admission. At home her ambulation was very limited, able to barely get  to the bathroom without taking a rest. She required assistance with all ADL's. Unable to complete any household activities on her own. She is on oxygen at home and has disabling SOB at rest. She has a pressure wound on her sacrum.   We discussed their current illness and what it means in the larger context of their on-going co-morbidities.  Natural disease trajectory and expectations at EOL were discussed. I attempted to elicit values and goals of care important to the patient.   The difference between aggressive medical intervention and comfort care was considered in light of the patient's goals of care.  The patient verbalized that she values quality over quantity and would want a more comfort focused approach to her care. She has a DNR in place.  She says that she feels she is suffering and she is dying. She has told her family she is ready to die. I asked her if she had pneumonia if she would want antibiotics for it and she stated she would not- however, her daughter intervened and stated she would want to discuss it further.      Advanced directives, concepts specific to code status, artifical feeding and  hydration, and rehospitalization were considered and discussed. Patient does not want any form of resuscitation or artificial feeding. Patient would not want dialysis.  Hospice and Palliative Care services outpatient were explained and offered. Family showed significant interest in possibly transitioning care to hospice support provided at home with [place] to be to remain at home and focus on patient's comfort and quality of life.   Questions and concerns were addressed.  Hard Choices booklet left for review. The family was encouraged to call with questions or concerns.    Primary Decision Maker PATIENT    SUMMARY OF RECOMMENDATIONS -Continue current level of care- would not escalate -Patient and family considering disposition options- possibly to include hospice support -PMT to follow-up  with family tomorrow morning Symptom management- SOB- fan blowing at side of face, 2.'5mg'$  SL morphine q2hr prn (noted pt unresponsive with '1mg'$  IV dilaudid in ED- will try very low dose SL morphine to see if this eases significant SOB- recommend monitor very closely for response)    Code Status/Advance Care Planning:  DNR  Palliative Prophylaxis:   Frequent Pain Assessment  Additional Recommendations (Limitations, Scope, Preferences):  No Artificial Feeding, No Surgical Procedures and No Tracheostomy    Prognosis:    < 6 months d/t adv CHF in setting of ideopathic pulmonary fibrosis, sig decreased functional status  Discharge Planning: To Be Determined  Primary Diagnoses: Present on Admission: . Acute on chronic respiratory failure with hypoxia ([place]) . Acute on chronic systolic (congestive) heart failure ([place]) . Atrial fibrillation ([place]) . CKD (chronic kidney disease), stage III ([place]) . Elevated troponin . Hyperglycemia . Hypothyroidism . PULMONARY FIBROSIS . Back pain   I have reviewed the medical record, interviewed the patient and family, and examined the patient. The following aspects are pertinent.  Past Medical History:  Diagnosis Date  . Atrial fibrillation (HCC)    Paroxysmal  . Cystitis 1974  . DJD (degenerative joint disease)    Dr Charlestine Night  . Hyperlipidemia 2005   NMR LIPOPROFILE: LDL 144 (1570/732), HDL 61, TG 103., LDL GOAL = < 130  . Idiopathic pulmonary fibrosis    IDIOPATHIC vs CHRONIC ERD  . Left bundle branch block   . Obstructive sleep apnea    CPAP Intolerant  . Pneumonia 01/2017  . Thyroid disease    HYPOTHROIDISM   Social History   Social History  . Marital status: Married    Spouse name: N/A  . Number of children: 1  . Years of education: N/A   Occupational History  . Retired Retired    [employer]   Social History Main Topics  . Smoking status: Former Smoker    Packs/day: 0.25    Years: 30.00    Types: Cigarettes    Quit  date: 06/04/1965  . Smokeless tobacco: Never Used     Comment: smoked 1953-1967, up to < 1/4 ppd  . Alcohol use Yes     Comment: Socially only  . Drug use: No  . Sexual activity: Not Asked   Other Topics Concern  . None   Social History Narrative   NO REGULAR EXERCISE   11/03/09 [employer] FORM SIGNED APPOINTING HUSBAND JAMES Start; OK TO LEAVE MESSAGE ON HOME # [phone]   Family History  Problem Relation Age of Onset  . Coronary artery disease Father   . Transient ischemic attack Father   . Stroke Father 57  . Asthma Mother   . Cancer Daughter        R breast;  S/P mastectomy  . Diabetes Neg Hx    Scheduled Meds: . acetaminophen  650 mg Oral TID  . lactose free nutrition  237 mL Oral BID BM  . levothyroxine  50 mcg Oral QAC breakfast  . lidocaine  1 patch Transdermal Q24H  . mouth rinse  15 mL Mouth Rinse BID  . predniSONE  20 mg Oral Q breakfast  . sodium chloride flush  3 mL Intravenous Q12H  . warfarin  2.5 mg Oral ONCE-1800  . Warfarin - Pharmacist Dosing Inpatient   Does not apply q1800   Continuous Infusions: . sodium chloride     PRN Meds:.sodium chloride, methocarbamol, morphine CONCENTRATE, ondansetron (ZOFRAN) IV, sodium chloride flush, traMADol Medications Prior to Admission:  Prior to Admission medications   Medication Sig Start Date End Date Taking? Authorizing Provider  acetaminophen (TYLENOL) 325 MG tablet Take 325 mg by mouth every 6 (six) hours as needed for pain.   Yes [provider]  albuterol (PROVENTIL HFA;VENTOLIN HFA) 108 (90 Base) MCG/ACT inhaler Inhale 2 puffs into the lungs every 6 (six) hours as needed for wheezing or shortness of breath. 06/19/16  Yes Parrett, Tammy S, NP  furosemide (LASIX) 40 MG tablet Take 1 tablet (40 mg total) by mouth daily. 01/30/17  Yes Burns, Claudina Lick, MD  levothyroxine (SYNTHROID, LEVOTHROID) 50 MCG tablet Take 1 tablet (50 mcg total) by mouth daily before breakfast. -- Office visit needed for further  refills 10/08/16  Yes Burns, Claudina Lick, MD  nystatin (MYCOSTATIN/NYSTOP) 100000 UNIT/GM POWD Apply twice a day as needed Patient taking differently: Apply 1 Bottle topically 2 (two) times daily as needed (skin irritation).  09/12/15  Yes Burns, Claudina Lick, MD  predniSONE (DELTASONE) 20 MG tablet Take 1 tablet (20 mg total) by mouth daily with breakfast. 01/29/17  Yes Burns, Claudina Lick, MD  warfarin (COUMADIN) 5 MG tablet TAKE ONE TABLET BY MOUTH AS DIRECTED BY  ANTICOAGULATION CLINIC Patient taking differently: TAKE ONE TABLET (5 MG) BY MOUTH ON MON AND FRI AND TAKE 0.5 TABLET (2.5 MG) THE REMAINING DAYS OF THE WEEK 08/20/16  Yes Burns, Claudina Lick, MD   Allergies  Allergen Reactions  . Dilaudid [Hydromorphone]     Patient went unresponsive  . Doxycycline Nausea Only  . Levaquin [Levofloxacin In D5w]     REACTION: leg weakness  . Amoxicillin     REACTION: upset stomach  . Lisinopril     cough  . Metoprolol     diarrhea   Review of Systems  Constitutional: Positive for activity change, appetite change and fatigue.  Respiratory: Positive for shortness of breath and wheezing.   Cardiovascular: Positive for leg swelling. Negative for chest pain.  Gastrointestinal: Negative for constipation.  Psychiatric/Behavioral: Positive for sleep disturbance. Negative for confusion.    Physical Exam  Constitutional: She appears well-developed and well-nourished.  Appears uncomfortable  Cardiovascular: Regular rhythm and intact distal pulses.   Tachycardic, 2+pedal and ankle edema  Pulmonary/Chest: She has wheezes.  Labored breathing  Abdominal: Soft. Bowel sounds are normal.  Genitourinary:  Genitourinary Comments: Dark urine output  Skin: Skin is warm and dry.  Scattered bruising, left elbow erythema with some edema  Psychiatric: She has a normal mood and affect. Her behavior is normal. Judgment and thought content normal.  Nursing note and vitals reviewed.   Vital Signs: BP (!) 142/90 (BP Location: Left  Arm)   Pulse 87   Temp 97.8 F (36.6 C) (Oral)   Resp (!) 24   Ht  $'5\' 7"'B$  (1.702 m)   Wt 58.4 kg (128 lb 12 oz)   SpO2 96%   BMI 20.16 kg/m  Pain Assessment: 0-10   Pain Score: 10-Worst pain ever   SpO2: SpO2: 96 % O2 Device:SpO2: 96 % O2 Flow Rate: .O2 Flow Rate (L/min): 5 L/min  IO: Intake/output summary:  Intake/Output Summary (Last 24 hours) at 03/11/17 1221 Last data filed at 03/11/17 0600  Gross per 24 hour  Intake              220 ml  Output              125 ml  Net               95 ml    LBM: Last BM Date: 03/06/17 (per pt) Baseline Weight: Weight: 59 kg (130 lb) Most recent weight: Weight: 58.4 kg (128 lb 12 oz)     Palliative Assessment/Data: PPS: 30%     Thank you for this consult. Palliative medicine will continue to follow and assist as needed.   Time In: 0930 Time Out: 1130 Time Total: 120 minutes Greater than 50%  of this time was spent counseling and coordinating care related to the above assessment and plan.  Signed by: Mariana Kaufman, AGNP-C Palliative Medicine    Please contact Palliative Medicine Team phone at [phone] for questions and concerns.  For individual provider: See Shea Evans

## 2017-03-11 NOTE — Care Management Important Message (Signed)
Important Message  Patient Details  Name: [name] MRN: [id number] Date of Birth: 29-Mar-1932   Medicare Important Message Given:  Yes    Caren Macadam 03/11/2017, 12:42 PMImportant Message  Patient Details  Name: [name] MRN: [id number] Date of Birth: 29-Nov-1931   Medicare Important Message Given:  Yes    Caren Macadam 03/11/2017, 12:42 PM

## 2017-03-11 NOTE — Progress Notes (Signed)
[facility] PROGRESS NOTE  [name] WUX:[id number] DOB: [birth year] DOA: 03/06/2017 PCP: Pincus Sanes, MD  Interim Summary and HPI 83 y.o. female with medical history significant of hypothyroidism; OSA not on CPAP; IPF; HLD; and afib on Coumadin presenting with back pain and worsening breathing/SOB. Patient found with increase LE swelling, elevated BNP and vascular congestion on CXR. Admitted for CHF exacerbation and further assessment/control of back pain.  Assessment/Plan: 1-acute on chronic resp failure: with vascular congestion on x-ray, elevated BNP and physical exam demonstrating increase volume status. Presentation and worsening SOB due to acute on chronic diastolic HF. -will continue holding diuretics today. Breathing slightly labor. Renal function worse today (2.71). -follow daily weights and strict intake/output  -continue heart healthy diet -patient is allergic to b-blocker and has acute exacerbation -no ACE/ARB due to renal dysfunction -patient denies CP -2-D echo in July with preserved EF and grade 1 DD  2-elevated troponin -in absence of CP or acute ischemic changes on EKG; most likely demand ischemia from CHF exacerbation, in the setting of CKD. -will continue to monitor and provide supportive care -remains chest pain free.  3-A. Fib: chronically on Coumadin  -patient INR therapeutic, but high normal -will follow pharmacy rec's for coumadin dose -continue monitor INR (2.98 today) -rate controlled.  4-acute lower back pain: -appears to be MSK in nature -worse with physical activity and movements -patient with episode of unresponsiveness while using IV narcotics -will start schedule tylenol, PRN muscle relaxer and PRN tramadol for severe pain -palliative care recommend also PRN SL morphine (low dose) -PT/OT ordered, recommending SNF -patient reproted pain is bothering her a lot today   5-hypothyroidism  -with mild elevated TSH -Free T4 no correlating  with abnormal TSH findings -most likely sick thyroid -will recommend repeat thyroid panel in 4 weeks -continue synthroid at current dose.  6-chronic resp failure and hx of IPF -will continue oxygen supplementation -continue chronic prednisone and continue use of PRN xopenex now -breathing slightly more labor today  7-acute on CKD: stage 3 at baseline -most likely due to diuresis; with contribution from initial CHF presentation and maybe progression of her underlying renal disease -will repeat BMET in am -patient Cr 2.7 today -continue holding/minimizing nephrotoxic agents  8-stage 2 pressure injury on sacral region -present on admission -will continue preventive measures    Code Status: DNR. Family Communication: daughter at bedside. Disposition Plan: renal function worse today, despite holding diuretics. Also complaining of SOB and back pain. palliative care meeting/GOC discussion ongoing    Consultants:  None   Procedures:  See below for X-ray reports   Antibiotics:  None   HPI/Subjective: No fever and no CP. Patient with poor PO intake overall. Reporting feeling bad and having some difficulty breathing.  Objective: Vitals:   03/11/17 0520 03/11/17 1434  BP: (!) 142/90 115/76  Pulse: 87 73  Resp: (!) 24 20  Temp: 97.8 F (36.6 C) 98.1 F (36.7 C)  SpO2: 96% 95%    Intake/Output Summary (Last 24 hours) at 03/11/17 1837 Last data filed at 03/11/17 1434  Gross per 24 hour  Intake              360 ml  Output              151 ml  Net              209 ml   Filed Weights   03/09/17 0606 03/10/17 0439 03/11/17 0520  Weight: 59.1 kg (130 lb  3.2 oz) 59.2 kg (130 lb 8.2 oz) 58.4 kg (128 lb 12 oz)    Exam:   General: no fever, no CP, no nausea or vomiting. Reports pain in her back was bad today. Also complaining of increase difficulty breathing.  Cardiovascular: no rubs, no gallops, no JVD, positive SEM  Respiratory: positive tachypnea, positive wheezing,  no frank crackles. No using accessory muscles.  Abdomen: soft, NT, positive BS  Musculoskeletal: LE exam is unchanged: 1+ edema, bilaterally, no cyanosis. TED hose in place.  Skin: skin pressure injury unchanged; (stage 2 pressure injury seen on sacral region; no superimposed infection or drainage appreciated. Injury on skin present on admission).  Data Reviewed: Basic Metabolic Panel:  Recent Labs Lab 03/07/17 0739 03/08/17 0422 03/09/17 0516 03/10/17 0452 03/11/17 0506  NA 139 139 140 139 136  K 4.5 5.5* 5.5* 5.0 5.0  CL 107 106 105 106 101  CO2 21* 18* 22 21* 21*  GLUCOSE 127* 158* 99 113* 110*  BUN 28* 38* 51* 58* 70*  CREATININE 1.61* 2.15* 2.60* 2.59* 2.71*  CALCIUM 8.7* 8.8* 8.6* 8.9 8.6*   Liver Function Tests:  Recent Labs Lab 03/06/17 2155  AST 34  ALT 21  ALKPHOS 59  BILITOT 1.6*  PROT 6.4*  ALBUMIN 3.0*   CBC:  Recent Labs Lab 03/06/17 2155 03/07/17 0739 03/07/17 2346 03/11/17 0506  WBC 17.3* 16.1* 12.6* 13.7*  NEUTROABS 13.6*  --  9.3*  --   HGB 15.7* 15.7* 15.6* 15.4*  HCT 47.0* 47.0* 47.3* 46.9*  MCV 99.8 99.4 100.9* 100.6*  PLT 211 189 193 180   Cardiac Enzymes:  Recent Labs Lab 03/06/17 2155 03/07/17 0138 03/07/17 0739 03/07/17 1156  TROPONINI 0.33* 0.25* 0.29* 0.26*   BNP (last 3 results)  Recent Labs  12/14/16 2033 03/06/17 2155  BNP 1,958.3* 2,693.7*    ProBNP (last 3 results)  Recent Labs  09/11/16 1455 01/29/17 1432  PROBNP 914.0* 1,247.0*    CBG:  Recent Labs Lab 03/07/17 0638 03/09/17 0748 03/09/17 0749 03/10/17 0731 03/11/17 0730  GLUCAP 127* 98 71 101* 106*    Studies: No results found.  Scheduled Meds: . acetaminophen  650 mg Oral TID  . lactose free nutrition  237 mL Oral BID BM  . levothyroxine  50 mcg Oral QAC breakfast  . lidocaine  1 patch Transdermal Q24H  . mouth rinse  15 mL Mouth Rinse BID  . predniSONE  20 mg Oral Q breakfast  . sodium chloride flush  3 mL Intravenous Q12H  .  Warfarin - Pharmacist Dosing Inpatient   Does not apply q1800   Continuous Infusions: . sodium chloride      Principal Problem:   Acute on chronic respiratory failure with hypoxia (HCC) Active Problems:   Hypothyroidism   Atrial fibrillation (HCC)   PULMONARY FIBROSIS   Anticoagulant long-term use   CKD (chronic kidney disease), stage III (HCC)   Hyperglycemia   Acute on chronic systolic (congestive) heart failure (HCC)   Elevated troponin   Back pain   Palliative care by specialist   Goals of care, counseling/discussion   Advance care planning    Time spent: 30 minutes.    Vassie Loll  [facility] Pager [phone]. If 7PM-7AM, please contact night-coverage at [email or web address], password [facility] 03/11/2017, 6:37 PM  LOS: 5 days

## 2017-03-12 ENCOUNTER — Telehealth: Payer: Self-pay | Admitting: Internal Medicine

## 2017-03-12 DIAGNOSIS — Z7189 Other specified counseling: Secondary | ICD-10-CM

## 2017-03-12 DIAGNOSIS — Z7901 Long term (current) use of anticoagulants: Secondary | ICD-10-CM

## 2017-03-12 DIAGNOSIS — N183 Chronic kidney disease, stage 3 (moderate): Secondary | ICD-10-CM

## 2017-03-12 DIAGNOSIS — I5033 Acute on chronic diastolic (congestive) heart failure: Secondary | ICD-10-CM

## 2017-03-12 DIAGNOSIS — N179 Acute kidney failure, unspecified: Secondary | ICD-10-CM

## 2017-03-12 LAB — BASIC METABOLIC PANEL
ANION GAP: 15 (ref 5–15)
BUN: 71 mg/dL — ABNORMAL HIGH (ref 6–20)
CALCIUM: 8.7 mg/dL — AB (ref 8.9–10.3)
CO2: 19 mmol/L — AB (ref 22–32)
Chloride: 103 mmol/L (ref 101–111)
Creatinine, Ser: 2.49 mg/dL — ABNORMAL HIGH (ref 0.44–1.00)
GFR, EST AFRICAN AMERICAN: 19 mL/min — AB (ref >60)
GFR, EST NON AFRICAN AMERICAN: 17 mL/min — AB (ref >60)
Glucose, Bld: 120 mg/dL — ABNORMAL HIGH (ref 65–99)
POTASSIUM: 5.4 mmol/L — AB (ref 3.5–5.1)
Sodium: 137 mmol/L (ref 135–145)

## 2017-03-12 LAB — PROTIME-INR
INR: 2.33
Prothrombin Time: 25.4 s — ABNORMAL HIGH (ref 11.4–15.2)

## 2017-03-12 MED ORDER — POLYVINYL ALCOHOL 1.4 % OP SOLN
1.0000 [drp] | Freq: Four times a day (QID) | OPHTHALMIC | Status: DC
Start: 2017-03-12 — End: 2017-03-12
  Filled 2017-03-12: qty 15

## 2017-03-12 MED ORDER — WARFARIN SODIUM 2 MG PO TABS: ORAL_TABLET | ORAL | 1 refills | Status: AC

## 2017-03-12 MED ORDER — TRAMADOL HCL 50 MG PO TABS: 50.0000 mg | ORAL_TABLET | Freq: Two times a day (BID) | ORAL | 0 refills | Status: AC | PRN

## 2017-03-12 MED ORDER — LIDOCAINE 5 % EX PTCH: 1.0000 | MEDICATED_PATCH | CUTANEOUS | 0 refills | Status: AC

## 2017-03-12 MED ORDER — MORPHINE SULFATE (CONCENTRATE) 10 MG/0.5ML PO SOLN: 2.6000 mg | ORAL | 0 refills | Status: AC | PRN

## 2017-03-12 MED ORDER — METHOCARBAMOL 500 MG PO TABS: 500.0000 mg | ORAL_TABLET | Freq: Three times a day (TID) | ORAL | 0 refills | Status: AC | PRN

## 2017-03-12 MED ORDER — BOOST PLUS PO LIQD: 237.0000 mL | Freq: Two times a day (BID) | ORAL | Status: AC

## 2017-03-12 MED ORDER — WARFARIN SODIUM 4 MG PO TABS
4.0000 mg | ORAL_TABLET | Freq: Once | ORAL | Status: AC
  Administered 2017-03-12: 4 mg via ORAL
  Filled 2017-03-12: qty 1

## 2017-03-12 MED ORDER — FUROSEMIDE 20 MG PO TABS: 20.0000 mg | ORAL_TABLET | Freq: Every day | ORAL | 1 refills | Status: AC

## 2017-03-12 MED ORDER — LEVALBUTEROL HCL 0.63 MG/3ML IN NEBU: 0.6300 mg | INHALATION_SOLUTION | Freq: Three times a day (TID) | RESPIRATORY_TRACT | 2 refills | Status: AC | PRN

## 2017-03-12 NOTE — Progress Notes (Signed)
ANTICOAGULATION CONSULT NOTE   Pharmacy Consult for warfarin Indication: atrial fibrillation  Allergies  Allergen Reactions  . Dilaudid [Hydromorphone]     Patient went unresponsive  . Doxycycline Nausea Only  . Levaquin [Levofloxacin In D5w]     REACTION: leg weakness  . Amoxicillin     REACTION: upset stomach  . Lisinopril     cough  . Metoprolol     diarrhea    Patient Measurements: Height: 5\' 7"  (170.2 cm) Weight: 126 lb 15.8 oz (57.6 kg) IBW/kg (Calculated) : 61.6   Vital Signs: Temp: 98.7 F (37.1 C) (10/09 0500) Temp Source: Oral (10/09 0500) BP: 114/76 (10/09 0500) Pulse Rate: 75 (10/09 0500)  Labs:  Recent Labs  03/10/17 0452 03/11/17 0506 03/12/17 0528  HGB  --  15.4*  --   HCT  --  46.9*  --   PLT  --  180  --   LABPROT 35.2* 30.8* 25.4*  INR 3.55 2.98 2.33  CREATININE 2.59* 2.71* 2.49*    Estimated Creatinine Clearance: 15.6 mL/min (A) (by C-G formula based on SCr of 2.49 mg/dL (H)).   Assessment: 31 yoF c/o back pain on chronic warfarin for A-fib. INR = 1.39 on admission.  Home dose is 5 mg M/F and 2.5 mg other days.  Last dose PTA was 10/1.  Recent INRs as outpatient have been SUPRAtherapeutic.    Today, 03/12/2017  INR therapeutic 2.33   CBC: Hgb stable, Pltc WNL  No bleeding issues reported  Goal of Therapy:  INR 2-3  Plan:   Warfarin 4 mg po x 1 dose today  Daily PT/INR  Monitor closely for s/sx of bleeding  Herby Abraham, Pharm.D. [phone] 03/12/2017 8:17 AM

## 2017-03-12 NOTE — Progress Notes (Signed)
Nutrition Follow-up  DOCUMENTATION CODES:   Not applicable  INTERVENTION:  - Continue Boost Plus BID, each supplement provides 360 kcal and 14 grams of protein. - Continue to encourage PO intakes of meals and supplements.   NUTRITION DIAGNOSIS:   Increased nutrient needs related to acute illness (acute on chronic respiratory failure) as evidenced by estimated needs. -ongoing  GOAL:   Patient will meet greater than or equal to 90% of their needs -unmet  MONITOR:   PO intake, Supplement acceptance, Weight trends, Labs, Skin  ASSESSMENT:   Pt is an 83 year old female admitted for back pain and acute on chronic respiratory failure with hypoxia. PMH of hypothyroidism, OSA not on CPAP, IPF, HLD, A fib on coumadin, and CHF.  10/9 Per chart review, pt has been consuming 25% or less of meals, on average. Yesterday she consumed 100% of breakfast but this meal was only toast and coffee. Weight -4 lbs/1.4 kg since admission. Reviewed chart, especially Palliative Care and Hospice Care notes. Prognosis <3 months stated in Palliative Care note. Will continue to monitor for plan for d/c.  Medications reviewed; 50 mcg oral Synthroid/day, 20 mg oral Deltasone/day. Labs reviewed; CBG: 106 mg/dL today, K: 5.4 mmol/L, BUN: 71 mg/dL, creatinine: 8.76 mg/dL, Ca: 8.7 mg/dL, GFR: 17 mL/min.     10/4 - Pt was very drowsy upon visit d/t medication. - NT reports pt did not consume breakfast as she has been sleeping since admission. - Pt reports a good appetite PTA and eating 3 meals per day.  - She could not recall what she usually eats.  - She has no n/v related to back pain.  - No issues chewing or swallowing.  - Per weight records in chart, pt currently weighs 132 lbs, 8/28- 129 lbs, 7/18- 130 lbs, 4/10- 136 lbs.  - Per H&P note, pt stopped taking Lasix and Coumadin per clinic recommendation and has now resumed.   Nutrition Focused Physical Exam Findings: Severe fat depletion in upper arm. No  muscle depletion. Mild edema in ankles.         Diet Order:  Diet Heart Room service appropriate? Yes; Fluid consistency: Thin  Skin:  Wound (see comment) (Stage 2 coccyx and Stage 3 L buttocks pressure injuries)  Last BM:  10/8  Height:   Ht Readings from Last 1 Encounters:  03/07/17 5\' 7"  (1.702 m)    Weight:   Wt Readings from Last 1 Encounters:  03/12/17 126 lb 15.8 oz (57.6 kg)    Ideal Body Weight:  61.4 kg  BMI:  Body mass index is 19.89 kg/m.  Estimated Nutritional Needs:   Kcal:  1200-1400 kcals  Protein:  60-70 grams of protein  Fluid:  >/= 2 L  EDUCATION NEEDS:   No education needs identified at this time    Trenton Gammon, MS, RD, LDN, CNSC Inpatient Clinical Dietitian Pager # [phone] After hours/weekend pager # [phone]

## 2017-03-12 NOTE — Progress Notes (Signed)
Daily Progress Note   Patient Name: [name]       Date: 03/12/2017 DOB: March 20, 1932  Age: 83 y.o. MRN#: [id number] Attending Physician: Vassie Loll, MD Primary Care Physician: Pincus Sanes, MD Admit Date: 03/06/2017  Reason for Consultation/Follow-up: Establishing goals of care, Non pain symptom management and Pain control  Subjective: Patient in bed. Daughter and spouse at bedside. Morphine is improving symptoms of SOB.  Patient states "I don't feel anything". She does not engage in GOC conversation today.  Family tells me she was discussing with them earlier and had concerns regarding facility cost.  Family has decided to proceed with Hospice care. Their primary GOC is patient's comfort and to ensure that patient is not suffering. We discussed patient's prognosis. Answered their questions regarding disposition options. MOST form was completed options selected were: DNR, Comfort Care, Avoid Hospitalization, determine antibiotics at the time, no feeding tube, IV fluids if indicated. Original given to family and a copy was sent to be scanned into patient's chart.   Review of Systems  Unable to perform ROS: Mental status change    Length of Stay: 6  Current Medications: Scheduled Meds:  . acetaminophen  650 mg Oral TID  . lactose free nutrition  237 mL Oral BID BM  . levothyroxine  50 mcg Oral QAC breakfast  . lidocaine  1 patch Transdermal Q24H  . mouth rinse  15 mL Mouth Rinse BID  . predniSONE  20 mg Oral Q breakfast  . sodium chloride flush  3 mL Intravenous Q12H  . warfarin  4 mg Oral ONCE-1800  . Warfarin - Pharmacist Dosing Inpatient   Does not apply q1800    Continuous Infusions: . sodium chloride      PRN Meds: sodium chloride, levalbuterol, methocarbamol,  morphine CONCENTRATE, ondansetron (ZOFRAN) IV, sodium chloride flush, traMADol  Physical Exam  Constitutional: No distress.  Pulmonary/Chest: Effort normal. She has wheezes.  Abdominal: Soft. Bowel sounds are normal.  Neurological:  Lethargic, drifts to sleep during conversation  Skin:  Scattered bruises  Nursing note and vitals reviewed.           Vital Signs: BP 114/76 (BP Location: Left Arm)   Pulse 75   Temp 98.7 F (37.1 C) (Oral)   Resp 20   Ht  (1.702 m)  Wt 57.6 kg (126 lb 15.8 oz)   SpO2 97%   BMI 19.89 kg/m  SpO2: SpO2: 97 % O2 Device: O2 Device: Nasal Cannula O2 Flow Rate: O2 Flow Rate (L/min): 5 L/min  Intake/output summary:  Intake/Output Summary (Last 24 hours) at 03/12/17 1018 Last data filed at 03/12/17 0603  Gross per 24 hour  Intake              120 ml  Output              126 ml  Net               -6 ml   LBM: Last BM Date: 03/11/17 Baseline Weight: Weight: 59 kg (130 lb) Most recent weight: Weight: 57.6 kg (126 lb 15.8 oz)       Palliative Assessment/Data: PPS: 30%      Patient Active Problem List   Diagnosis Date Noted  . Palliative care by specialist   . Goals of care, counseling/discussion   . Advance care planning   . Back pain 03/07/2017  . CHF exacerbation (HCC) 03/06/2017  . Physical deconditioning 01/29/2017  . Acute on chronic respiratory failure with hypoxia (HCC) 12/15/2016  . Acute on chronic systolic (congestive) heart failure (HCC) 12/15/2016  . Elevated troponin 12/15/2016  . Pressure injury of skin 12/15/2016  . Idiopathic scoliosis 09/12/2015  . Chronic respiratory failure (HCC) 08/19/2014  . Essential hypertension 05/04/2014  . Hyperglycemia 04/20/2014  . Encounter for therapeutic drug monitoring 07/08/2013  . CKD (chronic kidney disease), stage III (HCC) 04/21/2013  . Nonischemic cardiomyopathy (HCC) 11/28/2012  . Anticoagulant long-term use 10/10/2010  . Left bundle branch block   . PULMONARY FIBROSIS  04/20/2009  . GERD 04/20/2009  . EOSINOPHILIA 02/03/2009  . Atrial fibrillation (HCC) 01/18/2009  . Hypothyroidism 07/14/2007  . HYPERLIPIDEMIA 07/14/2007    Palliative Care Assessment & Plan   Patient Profile: 83 y.o. female  with past medical history of IPF, OSA not on CPAP, HLD, afib on coumadin admitted on 03/06/2017 with back pain, SOB, and lower extremity edema. Workup reveals CHF exacerbation in setting of pulmonary fibrosis (pulmonary edema on chest xray) with elevated troponin likely due to demand ischemia, acute on chronic kidney injury (Cr trending up, diuretics on hold today (10/8) with some worsening of patient's breathing. 10/9- pt Cr improving, breathing improved with morphine. Palliative medicine consulted for GOC.   Assessment/Recommendations/Plan   Continue morphine 2.5mg  SL q2hr prn for SOB  Family and patient have chosen to proceed with Hospice care- wish to maximize medically and then discharge  Discharge disposition to be determined- complicated by fact that patient's prognosis precludes residential Hospice and patient's insurance will only cover rehab. If patient is to go to SNF with hospice family will need to pay privately for SNF bed. Appreciate social work assistance during meeting explaining this to family. Family is concerned they cannot provide adequate custodial care for patient in the home.  Referral to care manager for Hospice at home options and to provide a list of private caregiver options for them to review as well.  Yellow DNR form completed and placed on chart  Goals of Care and Additional Recommendations:  Limitations on Scope of Treatment: Avoid Hospitalization, Minimize Medications, No Artificial Feeding, No Surgical Procedures and No Tracheostomy  Code Status:  DNR  Prognosis:   < 3 months likely less due to CHF in setting of pulmonary fibrosis and acute on chronic kidney disease. Patient with significant functional decline in last  3  months, now living bed to chair.   Discharge Planning:  To Be Determined  Care plan was discussed with patient, family, attending physician, social work.   Thank you for allowing the Palliative Medicine Team to assist in the care of this patient.   Time In: 0900 Time Out: 1030 Total Time 90 minutes Prolonged Time Billed Yes      Greater than 50%  of this time was spent counseling and coordinating care related to the above assessment and plan.  Ocie Bob, AGNP-C Palliative Medicine   Please contact Palliative Medicine Team phone at [phone] for questions and concerns.

## 2017-03-12 NOTE — Discharge Summary (Signed)
Physician Discharge Summary  [name] ZOX:[id number] DOB: Jul 21, 1931 DOA: 03/06/2017  PCP: Pincus Sanes, MD  Admit date: 03/06/2017 Discharge date: 03/12/2017  Time spent: 35 minutes  Recommendations for Outpatient Follow-up:  Symptomatic management  Discharge home with hospice care Follow patient symptoms control and comfort Minimize/avoid further hospitalization  Clean medication list and tailored it for comfort management only as condition progresses.  Discharge Diagnoses:  Principal Problem:   Acute on chronic respiratory failure with hypoxia (HCC) Active Problems:   Hypothyroidism   Atrial fibrillation (HCC)   PULMONARY FIBROSIS   Anticoagulant long-term use   CKD (chronic kidney disease), stage III (HCC)   Hyperglycemia   Acute on chronic systolic (congestive) heart failure (HCC)   Elevated troponin   Back pain   Palliative care by specialist   Goals of care, counseling/discussion   Advance care planning   Acute renal failure superimposed on stage 3 chronic kidney disease (HCC)   Acute on chronic diastolic CHF (congestive heart failure) (HCC)   Discharge Condition: stable. Patient discharge home with hospice care. Will follow up with PCP in 10 days.  Diet recommendation: watch sodium diet; follow feeding supplements as recommended by nutritional service   Filed Weights   03/10/17 0439 03/11/17 0520 03/12/17 0500  Weight: 59.2 kg (130 lb 8.2 oz) 58.4 kg (128 lb 12 oz) 57.6 kg (126 lb 15.8 oz)    History of present illness:  83 y.o.femalewith medical history significant of hypothyroidism; OSA not on CPAP; IPF; HLD; and afib on Coumadin presenting with back pain and worsening breathing/SOB. Patient found with increase LE swelling, elevated BNP and vascular congestion on CXR. Admitted for CHF exacerbation and further assessment/control of back pain.  Hospital Course:  1-acute on chronic resp failure: with vascular congestion on x-ray, elevated BNP and  physical exam demonstrating increase volume status. Presentation and worsening SOB due to acute on chronic diastolic HF. -will discharge on lasix  daily (even if that means higher Cr down the road; plan is for symptom management and volume control). Renal function 2.49 -follow daily weights and watch sodium intake -continue heart healthy diet -patient is allergic to b-blocker. -no ACE/ARB due to renal dysfunction -patient denies CP and at discharge expressed breathing to be stable. -2-D echo in July with preserved EF and grade 1 DD  2-elevated troponin -in absence of CP or acute ischemic changes on EKG; most likely demand ischemia from CHF exacerbation, in the setting of CKD. -remained chest pain free.  3-A. Fib: chronically on Coumadin  -patient INR therapeutic at discharge -coumadin dose adjusted to prevent supra-therapeutic levels.  -continue monitor INR (2.33 today); probably stop coumadin when moving forward into full comfort. -rate controlled at discharge.  4-acute lower back pain: -appears to be MSK in nature -worse with physical activity and movements -patient with episode of unresponsiveness while using IV narcotics at time of admission; resolved with use of Narcan. -will discharge with schedule tylenol, PRN muscle relaxer and PRN tramadol for severe pain -palliative care recommended also PRN SL morphine (low dose); which per patient has helped controlling discomfort. -PT/OT ordered, recommended SNF; family opted for symptoms management and to take patient home with hospice.  5-Hypothyroidism  -with mild elevated TSH -Free T4 no correlating with abnormal TSH findings -most likely sick thyroid in setting of acute distress on presentation. -continue synthroid at current dose. -main goal of care is for symptomatic management   6-chronic resp failure and hx of IPF -will continue oxygen supplementation -continue chronic  prednisone and continue use of PRN xopenex  now -breathing improved -will discharge on concentrated morphine to assist with SOB and discomfort. -main goal after GOC discussion is for symptoms management.  7-acute on CKD: stage 3 at baseline -most likely due to diuresis; with contribution from initial CHF presentation and maybe progression of her underlying renal disease -at discharge Cr 2.4 -base on GOC decision and plan to treat symptomatically will not recommend further blood work at this point to follow renal function. -will discharge on lasix 20 mg daily and ask patient to maintain adequate hydration.   8-stage 2 pressure injury on sacral region -present on admission -will recommend continue preventive measures   9-severe protein calorie malnutrition  -will continue Boost Patient encourage to increase PO intake  Procedures: See below for x-ray reports   Consultations:  Palliative care   Discharge Exam: Vitals:   03/12/17 0500 03/12/17 1436  BP: 114/76 133/87  Pulse: 75 74  Resp: 20 19  Temp: 98.7 F (37.1 C) (!) 97.4 F (36.3 C)  SpO2: 97% 97%    General: no fever, no CP, no nausea or vomiting. Reports pain in her back is under better control. Patient also with improvement in her breathing. Continue to have poor appetite and PO intake.  Cardiovascular: no rubs, no gallops, no JVD, positive SEM  Respiratory: fair air movement, no using accessory muscles, mild end exp wheezing, no frank crackles.  Abdomen: soft, NT, positive BS  Musculoskeletal: LE exam is unchanged: 1+ edema, bilaterally, no cyanosis. TED hose in place.  Skin: skin pressure injury unchanged; (stage 2 pressure injury seen on sacral region; no superimposed infection or drainage appreciated. Injury on skin present on admission).   Discharge Instructions   Discharge Instructions    Diet - low sodium heart healthy    Complete by:  As directed    Discharge instructions    Complete by:  As directed    Maintain adequate hydration Watch  sodium in her diet Check weight on daily basis     Current Discharge Medication List    START taking these medications   Details  lactose free nutrition (BOOST PLUS) LIQD Take 237 mLs by mouth 2 (two) times daily between meals.    levalbuterol (XOPENEX) 0.63 MG/3ML nebulizer solution Take 3 mLs (0.63 mg total) by nebulization every 8 (eight) hours as needed for wheezing or shortness of breath. Qty: 30 mL, Refills: 2    lidocaine (LIDODERM) 5 % Place 1 patch onto the skin daily. Remove & Discard patch within 12 hours or as directed by MD Qty: 30 patch, Refills: 0    methocarbamol (ROBAXIN) 500 MG tablet Take 1 tablet (500 mg total) by mouth every 8 (eight) hours as needed for muscle spasms. Qty: 40 tablet, Refills: 0    Morphine Sulfate (MORPHINE CONCENTRATE) 10 MG/0.5ML SOLN concentrated solution Place 0.13 mLs (2.6 mg total) under the tongue every 4 (four) hours as needed for severe pain or shortness of breath. Qty: 30 mL, Refills: 0    traMADol (ULTRAM) 50 MG tablet Take 1 tablet (50 mg total) by mouth every 12 (twelve) hours as needed for moderate pain. Qty: 20 tablet, Refills: 0      CONTINUE these medications which have CHANGED   Details  furosemide (LASIX) 20 MG tablet Take 1 tablet (20 mg total) by mouth daily. Qty: 30 tablet, Refills: 1    warfarin (COUMADIN) 2 MG tablet Take 4 mg on Thursday and Sunday; take 2 mg daily the  others day. Qty: 30 tablet, Refills: 1      CONTINUE these medications which have NOT CHANGED   Details  acetaminophen (TYLENOL) 325 MG tablet Take 325 mg by mouth every 6 (six) hours as needed for pain.    levothyroxine (SYNTHROID, LEVOTHROID) 50 MCG tablet Take 1 tablet (50 mcg total) by mouth daily before breakfast. -- Office visit needed for further refills Qty: 90 tablet, Refills: 0    predniSONE (DELTASONE) 20 MG tablet Take 1 tablet (20 mg total) by mouth daily with breakfast.      STOP taking these medications     albuterol (PROVENTIL  HFA;VENTOLIN HFA) 108 (90 Base) MCG/ACT inhaler      nystatin (MYCOSTATIN/NYSTOP) 100000 UNIT/GM POWD        Allergies  Allergen Reactions  . Dilaudid [Hydromorphone]     Patient went unresponsive  . Doxycycline Nausea Only  . Levaquin [Levofloxacin In D5w]     REACTION: leg weakness  . Amoxicillin     REACTION: upset stomach  . Lisinopril     cough  . Metoprolol     diarrhea   Follow-up Information    [facility].   Specialty:  Hospice and Palliative Medicine Why:  Home nurse Contact information: [address] [place] [address] [phone]        Pincus Sanes, MD. Schedule an appointment as soon as possible for a visit in 2 week(s).   Specialty:  Internal Medicine Contact information: [address] [place] [address] [phone]           The results of significant diagnostics from this hospitalization (including imaging, microbiology, ancillary and laboratory) are listed below for reference.    Significant Diagnostic Studies: Dg Chest 2 View  Result Date: 03/06/2017 CLINICAL DATA:  Low back pain EXAM: CHEST  2 VIEW COMPARISON:  12/14/2016, 09/11/2016, 11/08/2015 FINDINGS: Bilateral pulmonary fibrosis. Slight increased diffuse interstitial opacity compared to the prior radiograph. No pleural effusion. Stable cardiomediastinal silhouette with atherosclerosis. No pneumothorax IMPRESSION: Findings consistent with diffuse pulmonary fibrosis. Slight increased interstitial density compared to prior could relate to acute edema or inflammation superimposed on chronic change. Electronically Signed   By: Jasmine Pang M.D.   On: 03/06/2017 22:49   Dg Lumbar Spine Complete  Result Date: 03/06/2017 CLINICAL DATA:  Low back pain EXAM: LUMBAR SPINE - COMPLETE 4+ VIEW COMPARISON:  MRI 06/09/2013 FINDINGS: Mild to moderate levoscoliosis of the lumbar spine. SI joint disease. 8 mm retrolisthesis of L3 on L4. Lumbar vertebral body heights are  maintained. Advanced degenerative changes at L1-L2, L2-L3 and L3-L4, and L5-S1. Extensive vascular calcifications in the aorta and left upper quadrant branch vessels IMPRESSION: Moderate levoscoliosis of the lumbar spine with marked degenerative changes. No acute osseous abnormality Electronically Signed   By: Jasmine Pang M.D.   On: 03/06/2017 22:47    Microbiology: No results found for this or any previous visit (from the past 240 hour(s)).   Labs: Basic Metabolic Panel:  Recent Labs Lab 03/08/17 0422 03/09/17 0516 03/10/17 0452 03/11/17 0506 03/12/17 0528  NA 139 140 139 136 137  K 5.5* 5.5* 5.0 5.0 5.4*  CL 106 105 106 101 103  CO2 18* 22 21* 21* 19*  GLUCOSE 158* 99 113* 110* 120*  BUN 38* 51* 58* 70* 71*  CREATININE 2.15* 2.60* 2.59* 2.71* 2.49*  CALCIUM 8.8* 8.6* 8.9 8.6* 8.7*   Liver Function Tests:  Recent Labs Lab 03/06/17 2155  AST 34  ALT  21  ALKPHOS 59  BILITOT 1.6*  PROT 6.4*  ALBUMIN 3.0*   CBC:  Recent Labs Lab 03/06/17 2155 03/07/17 0739 03/07/17 2346 03/11/17 0506  WBC 17.3* 16.1* 12.6* 13.7*  NEUTROABS 13.6*  --  9.3*  --   HGB 15.7* 15.7* 15.6* 15.4*  HCT 47.0* 47.0* 47.3* 46.9*  MCV 99.8 99.4 100.9* 100.6*  PLT 211 189 193 180   Cardiac Enzymes:  Recent Labs Lab 03/06/17 2155 03/07/17 0138 03/07/17 0739 03/07/17 1156  TROPONINI 0.33* 0.25* 0.29* 0.26*   BNP: BNP (last 3 results)  Recent Labs  12/14/16 2033 03/06/17 2155  BNP 1,958.3* 2,693.7*    ProBNP (last 3 results)  Recent Labs  09/11/16 1455 01/29/17 1432  PROBNP 914.0* 1,247.0*    CBG:  Recent Labs Lab 03/07/17 0638 03/09/17 0748 03/09/17 0749 03/10/17 0731 03/11/17 0730  GLUCAP 127* 98 71 101* 106*    Signed:  Vassie Loll MD.  [facility] 03/12/2017, 4:12 PM

## 2017-03-12 NOTE — Progress Notes (Signed)
Per [facility] rep-DME on schedule for delivery to patient's home between 2-6p.

## 2017-03-12 NOTE — Progress Notes (Signed)
Spoke with patient's daughter, [name] who stated that the medical equipment has arrived from [facility].  Writer spoke with [facility] to see about arranging a nurse to come out to the house tonight but will not be able to do so until the morning at 10 am.  Will inform patient's daughter of this.  Attempted to arrange transportation with [facility] who stated that they are unable to arrange transportation with [facility] but for me to call [facility] who were called and transportation has been arranged.

## 2017-03-12 NOTE — Telephone Encounter (Signed)
Notified Yvette w/MD response...Raechel Chute

## 2017-03-12 NOTE — Progress Notes (Signed)
PT Cancellation Note  Patient Details Name: [name] MRN: [id number] DOB: 12/30/1931   Cancelled Treatment:    Reason Eval/Treat Not Completed: Pt set to d/c home with hospice. Will sign off at this time.    Rebeca Alert, MPT Pager: [phone]

## 2017-03-12 NOTE — Progress Notes (Signed)
[facility]) Hospital Liaison:  RN visit  Notified by Olegario Messier [facility], of patient/family request for [facility] services at home after discharge. Chart and patient information under review by [facility] physician. Hospice eligibility pending at this time.  Writer spoke with patient and family at bedside to initiate education related to hospice philosophy, services and team approach to care.  Patient/family verbalized understanding of information given.  Per discussion, plan is for discharge to home by [facility] on 03/13/2017.  Please send signed and completed DNR form home with patient/family.  Patient will need prescriptions for discharge comfort medications.  DME needs have been discussed, patient currently has the following equipment in the home:  Dan Humphreys, W/C, 3N1, and shower chair.  Patient/family requests the following DME for delivery to the home: hospital bed with gel overlay and OBT, Oxygen concentrator.  [facility] equipment manager has been notified and will contact [facility] to arrange delivery to the home.  Home address has been verified and is correct in the chart.  Olesia Mckinstry is the family member to contact to arrange time of delivery.  [facility] aware of the above.  Completed discharge summary will need to be faxed to [facility] at [phone] when final.  Please notify [facility] when patient is ready to leave the unit at discharge. (Call [phone] or [phone] after 5pm.)  [facility] information and contact numbers given to husband and daughter during this visit.  Above information shared with Olegario Messier, [facility].  Please call with any hospice related questions.  Thank you for this referral.  Elsie Saas, RN, CCM [facility] Liaison [phone] liaisons are now on AMION.

## 2017-03-12 NOTE — Progress Notes (Signed)
CSW spoke with patient's daughter to provide bed offers, patient's daughter reported that they have decided to dc home with hospice. CSW signing off, no other needs identified at this time.  Celso Sickle, [place] Clinical Social Worker [facility] Cell#: [phone]

## 2017-03-12 NOTE — [place] FL2 (Signed)
[place] MEDICAID FL2 LEVEL OF CARE SCREENING TOOL     IDENTIFICATION  Patient Name: [name] Birthdate: April 26, 1932 Sex: female Admission Date (Current Location): 03/06/2017  [facility] and [place] Number:  Producer, television/film/video and Address:  [facility],  [address] [place], [place] [address]      Provider Number: [id number]  Attending Physician Name and Address:  Vassie Loll, MD  Relative Name and Phone Number:       Current Level of Care: Hospital Recommended Level of Care: Skilled Nursing Facility Prior Approval Number:    Date Approved/Denied:   PASRR Number: [id number]  Discharge Plan: SNF    Current Diagnoses: Patient Active Problem List   Diagnosis Date Noted  . Palliative care by specialist   . Goals of care, counseling/discussion   . Advance care planning   . Back pain 03/07/2017  . CHF exacerbation (HCC) 03/06/2017  . Physical deconditioning 01/29/2017  . Acute on chronic respiratory failure with hypoxia (HCC) 12/15/2016  . Acute on chronic systolic (congestive) heart failure (HCC) 12/15/2016  . Elevated troponin 12/15/2016  . Pressure injury of skin 12/15/2016  . Idiopathic scoliosis 09/12/2015  . Chronic respiratory failure (HCC) 08/19/2014  . Essential hypertension 05/04/2014  . Hyperglycemia 04/20/2014  . Encounter for therapeutic drug monitoring 07/08/2013  . CKD (chronic kidney disease), stage III (HCC) 04/21/2013  . Nonischemic cardiomyopathy (HCC) 11/28/2012  . Anticoagulant Takeyah Wieman-term use 10/10/2010  . Left bundle branch block   . PULMONARY FIBROSIS 04/20/2009  . GERD 04/20/2009  . EOSINOPHILIA 02/03/2009  . Atrial fibrillation (HCC) 01/18/2009  . Hypothyroidism 07/14/2007  . HYPERLIPIDEMIA 07/14/2007    Orientation RESPIRATION BLADDER Height & Weight     Self, Situation, Place  O2 Incontinent Weight: 126 lb 15.8 oz (57.6 kg) Height:   (170.2 cm)  BEHAVIORAL SYMPTOMS/MOOD NEUROLOGICAL BOWEL NUTRITION STATUS       Diet (Heart)  AMBULATORY STATUS COMMUNICATION OF NEEDS Skin   Total Care Verbally PU Stage and Appropriate Care   PU Stage 2 Dressing:  (PressureInjury10/04/18StageII-Partialthicknesslossofdermispresentingasashallowopenulcerwithared,pinkwoundbedwithoutslough. Location: Coccyx Location Orientation: Posterior;Mid Foam dressing PRN)                   Personal Care Assistance Level of Assistance  Bathing, Feeding, Dressing Bathing Assistance: Maximum assistance Feeding assistance: Maximum assistance Dressing Assistance: Maximum assistance     Functional Limitations Info             SPECIAL CARE FACTORS FREQUENCY                       Contractures Contractures Info: Not present    Additional Factors Info  Code Status, Allergies Code Status Info: DNR Allergies Info: Doxycycline;Levaquin Levofloxacin In D5w;Amoxicillin;Dilaudid Hydromorphone;Lisinopril;Metoprolol           Current Medications (03/12/2017):  This is the current hospital active medication list Current Facility-Administered Medications  Medication Dose Route Frequency Provider Last Rate Last Dose  . 0.9 %  sodium chloride infusion  250 mL Intravenous PRN Jonah Blue, MD      . acetaminophen (TYLENOL) tablet 650 mg  650 mg Oral TID Vassie Loll, MD   650 mg at 03/11/17 1516  . lactose free nutrition (BOOST PLUS) liquid 237 mL  237 mL Oral BID BM Vassie Loll, MD   237 mL at 03/11/17 1059  . levalbuterol (XOPENEX) nebulizer solution 0.63 mg  0.63 mg Nebulization Q8H PRN Vassie Loll, MD      . levothyroxine (SYNTHROID, LEVOTHROID)  tablet 50 mcg  50 mcg Oral QAC breakfast Jonah Blue, MD   50 mcg at 03/12/17 [phone]  . lidocaine (LIDODERM) 5 % 1 patch  1 patch Transdermal Q24H Vassie Loll, MD   1 patch at 03/11/17 1717  . MEDLINE mouth rinse  15 mL Mouth Rinse BID Jonah Blue, MD   15 mL at 03/11/17 2257  . methocarbamol (ROBAXIN) tablet 500 mg  500 mg Oral Q8H  PRN Vassie Loll, MD   500 mg at 03/11/17 1725  . morphine CONCENTRATE 10 MG/0.5ML oral solution 2.6 mg  2.6 mg Sublingual Q2H PRN Barbara Cower, NP   2.6 mg at 03/12/17 1107  . ondansetron (ZOFRAN) injection 4 mg  4 mg Intravenous Q6H PRN Jonah Blue, MD      . predniSONE (DELTASONE) tablet 20 mg  20 mg Oral Q breakfast Jonah Blue, MD   20 mg at 03/12/17 0811  . sodium chloride flush (NS) 0.9 % injection 3 mL  3 mL Intravenous Q12H Jonah Blue, MD   3 mL at 03/11/17 2257  . sodium chloride flush (NS) 0.9 % injection 3 mL  3 mL Intravenous PRN Jonah Blue, MD      . traMADol Janean Sark) tablet 50 mg  50 mg Oral Q12H PRN Vassie Loll, MD   50 mg at 03/11/17 0825  . warfarin (COUMADIN) tablet 4 mg  4 mg Oral ONCE-1800 Herby Abraham, RPH      . Warfarin - Pharmacist Dosing Inpatient   Does not apply q1800 Lorenza Evangelist, [facility]   Stopped at 03/08/17 1800     Discharge Medications: Please see discharge summary for a list of discharge medications.  Relevant Imaging Results:  Relevant Lab Results:   Additional Information  SSN [id number]  Antionette Poles, LCSW

## 2017-03-12 NOTE — Telephone Encounter (Signed)
Called stating the pt is scheduled to discharge from the hosp tomorrow and will going home with hospice and they need to verify that [name] will be the attending. Please advise and call back

## 2017-03-12 NOTE — Care Management Note (Signed)
Case Management Note  Patient Details  Name: ALLIENE ASHWORTH MRN: [id number] Date of Birth: [birth year]  Subjective/Objective:CM referral for Home hospice choice-Provided patient/dtr/spouse w/Home hospice agency list-chose [facility]-rep Chales Abrahams has evaluated & accepted-they will manage all dme w/family for home. Hospital bed,overlay gel mattress to arrive in home prior to patient d/c to home. Patient already has home 02 through [facility] will arrange to have switched out-they contract w/[facility] for all dme. PTAR for ambulance non emergency forms on shadow chart. Out of facility DNR also on shadow chart. No further CM needs.                   Action/Plan:d/c home w/hospice   Expected Discharge Date:                  Expected Discharge Plan:  Home w Hospice Care  In-House Referral:     Discharge planning Services  CM Consult  Post Acute Care Choice:  Home Health Choice offered to:  Patient  DME Arranged:    DME Agency:     HH Arranged:    HH Agency:  [facility]  Status of Service:  Completed, signed off  If discussed at Long Length of Stay Meetings, dates discussed:    Additional Comments:  Lanier Clam, RN 03/12/2017, 12:31 PM

## 2017-03-12 NOTE — Progress Notes (Signed)
OT Cancellation Note  Patient Details Name: [name] MRN: [id number] DOB: 1931-08-02   Cancelled Treatment:    Reason Eval/Treat Not Completed: Other (comment).  Plan is for home with hospice. RN reports pt continues to have high pain with any movement. Will sign off.   Adalia Pettis 03/12/2017, 1:59 PM  Marica Otter, OTR/L [phone] 03/12/2017

## 2017-03-12 NOTE — Telephone Encounter (Signed)
ok 

## 2017-03-12 NOTE — Progress Notes (Signed)
CSW spoke with patient's family at bedside, patient alert and did not participate in assessment. Patient's daughter reported that they were unaware of discharge plans and are considering home with hospice vs SNF with hospice. CSW explained to patient's family that the payor source for SNF with hospice would be private pay or [facility] term care policy, patient's daughter reported that patient does not have a [facility] term care policy. Patient's daughter requested bed offers to have options regarding discharge planning. CSW completed FL2 and will provide bed offers.   Celso Sickle, [place] Clinical Social Worker [facility] Cell#: [phone]

## 2017-03-13 ENCOUNTER — Telehealth: Payer: Self-pay | Admitting: *Deleted

## 2017-03-13 NOTE — Telephone Encounter (Signed)
Pt was on TCM list admitted 03/06/17 for CHF exacerbation and further assessment/control of back pain. Dx w/  Acute on chronic respiratory failure with hypoxia (HCC). Pt d/c 03/12/17 to hospice care. Dr. Lawerance Bach agreed to be the attending MD already...[name]

## 2017-03-18 ENCOUNTER — Telehealth: Payer: Self-pay

## 2017-03-18 ENCOUNTER — Telehealth: Payer: Self-pay | Admitting: Internal Medicine

## 2017-03-18 NOTE — Telephone Encounter (Signed)
On 03/18/17 I received a d/c from [employer] ([employer]). The d/c is for cremation. The patient is a patient of Doctor Burns. The d/c will be taken to [facility] this pm for signature.  On 03/20/17 I received the d/c back from [facility]. I got the d/c ready and called the funeral home to let them know the d/c is ready for pickup. I also faxed a copy to the funeral home per the funeral home request.

## 2017-03-18 NOTE — Telephone Encounter (Signed)
Pt daceased on 03/27/17 at 1:38pm at home

## 2017-04-04 DEATH — deceased

## 2018-12-08 IMAGING — CR DG CHEST 2V
2 series · 2 of 2 positions shown · non-contrast
Comparison: 12/14/2016, 09/11/2016, 11/08/2015

CLINICAL DATA: Low back pain

EXAM:
CHEST  2 VIEW

[w chest lat]
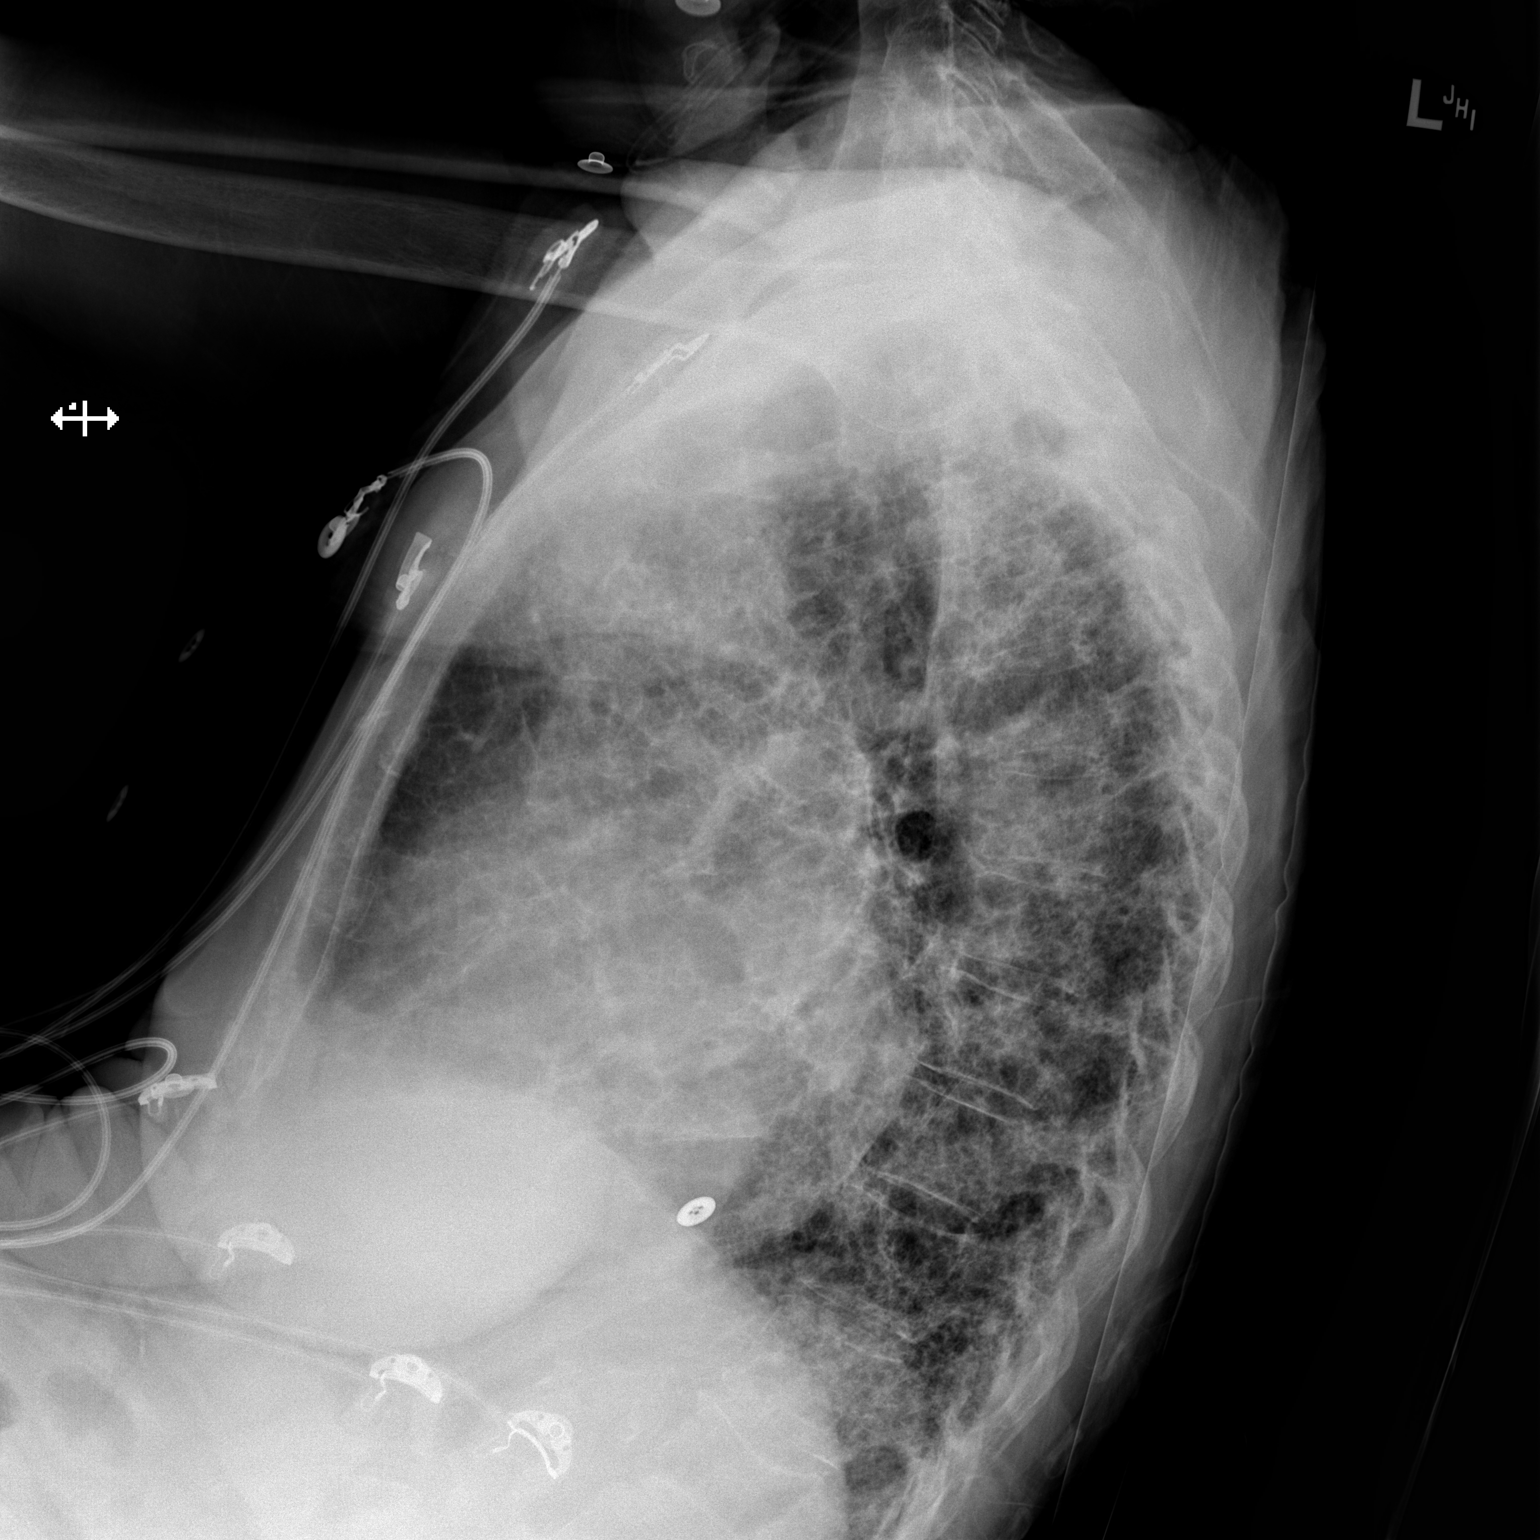

[x chest ap]
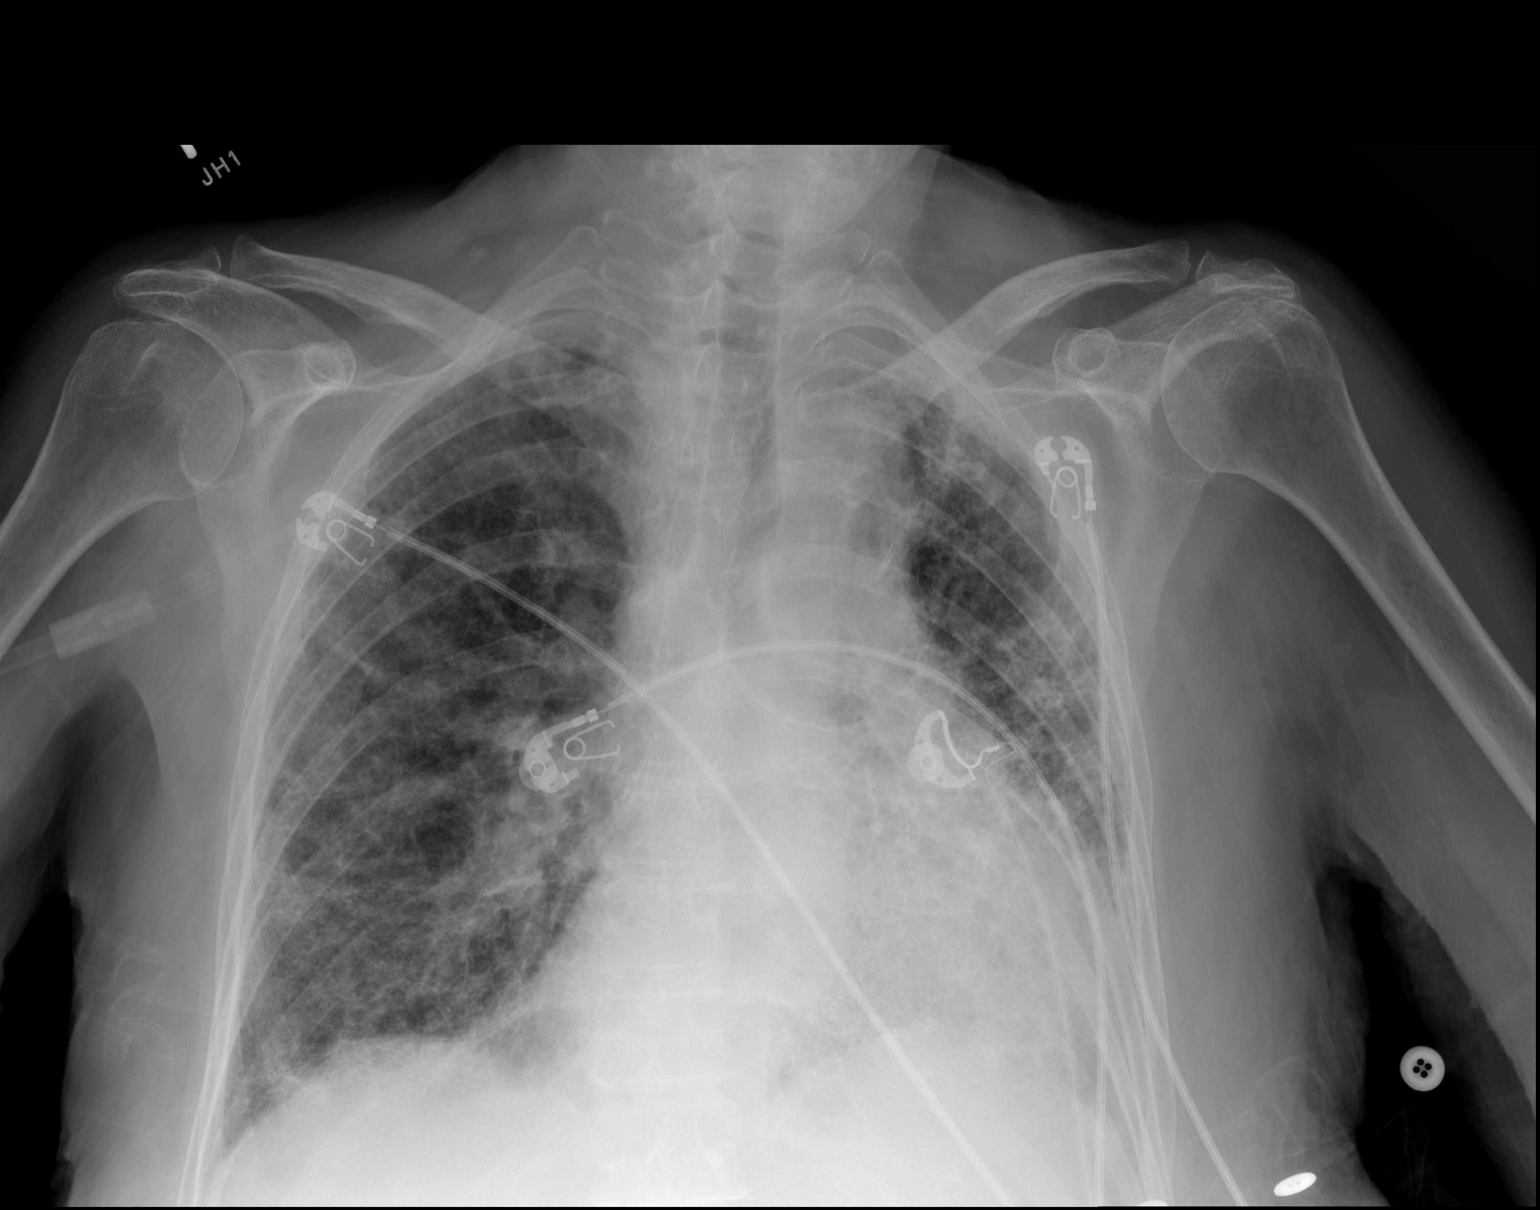

[2 of 2 positions shown; findings below may reference images not displayed]

FINDINGS: Bilateral pulmonary fibrosis. Slight increased diffuse interstitial
opacity compared to the prior radiograph. No pleural effusion.
Stable cardiomediastinal silhouette with atherosclerosis. No
pneumothorax
IMPRESSION: Findings consistent with diffuse pulmonary fibrosis. Slight
increased interstitial density compared to prior could relate to
acute edema or inflammation superimposed on chronic change.

## 2018-12-08 IMAGING — CR DG LUMBAR SPINE COMPLETE 4+V
5 series · 5 of 5 positions shown · non-contrast
Comparison: MRI 06/09/2013

CLINICAL DATA: Low back pain

EXAM:
LUMBAR SPINE - COMPLETE 4+ VIEW

[t lumbar spine ap]
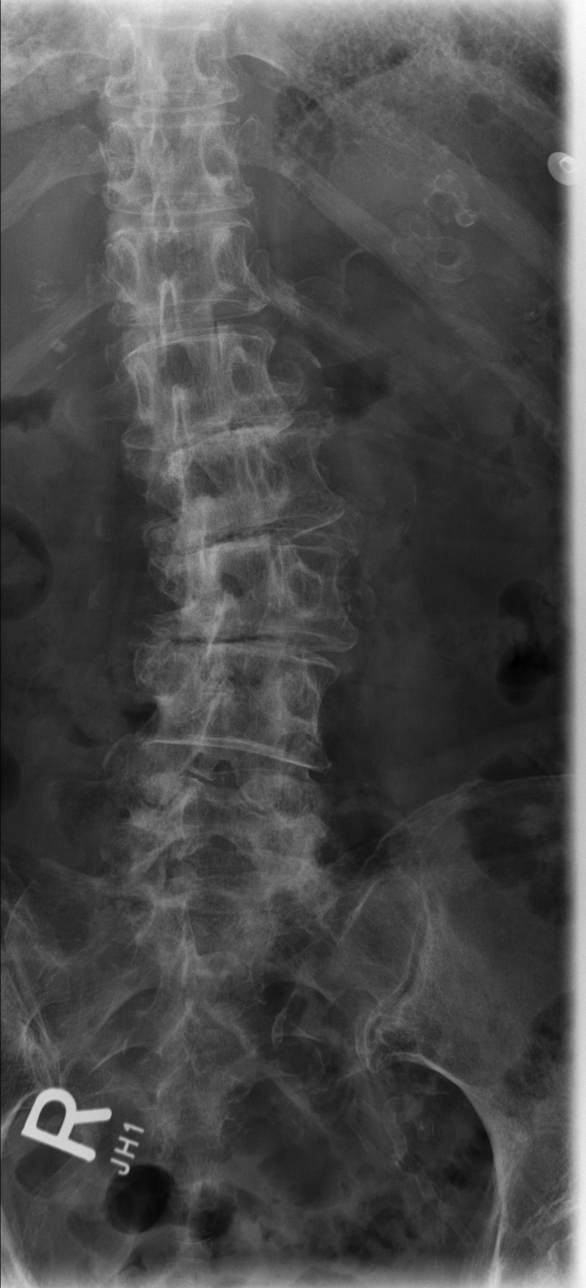

[t lumbar spine obl (1 of 2)]
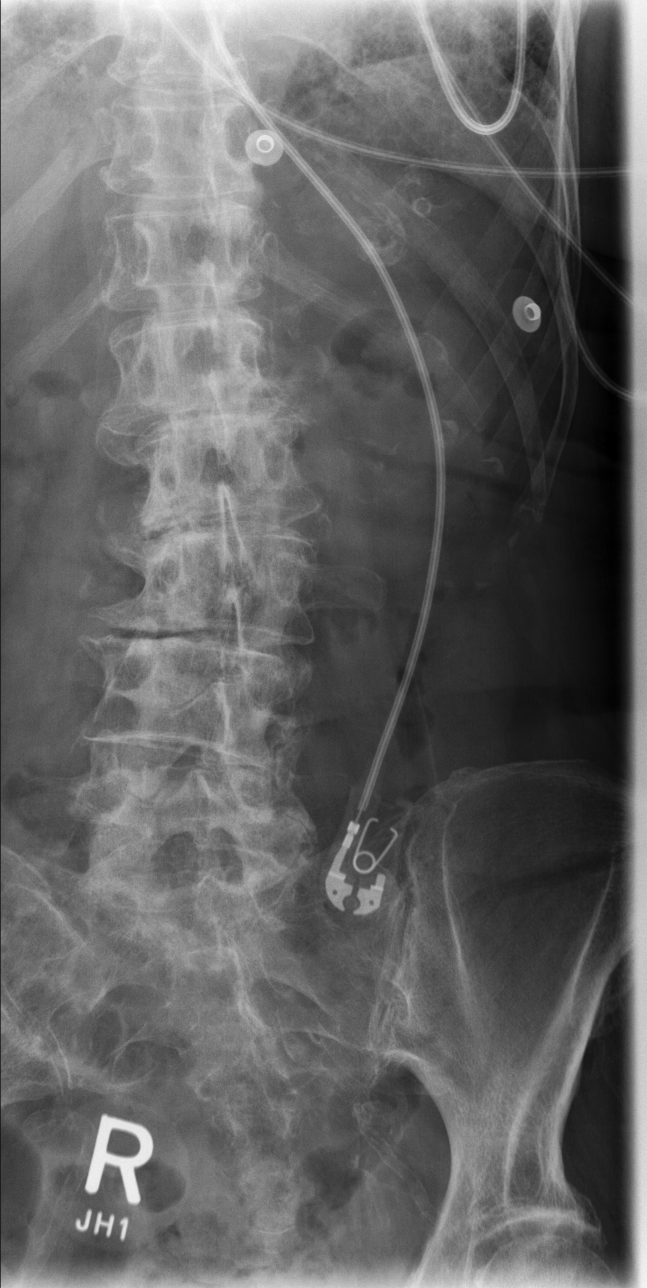

[t lumbar spine obl (2 of 2)]
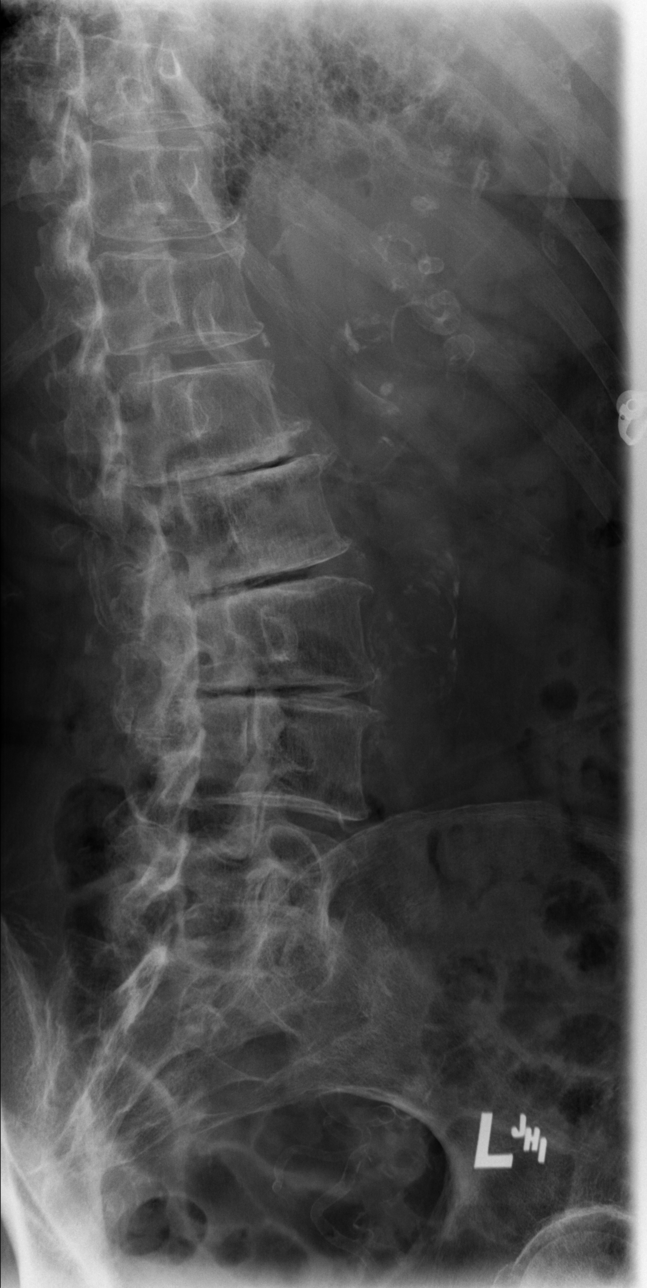

[t lumbar spine lat]
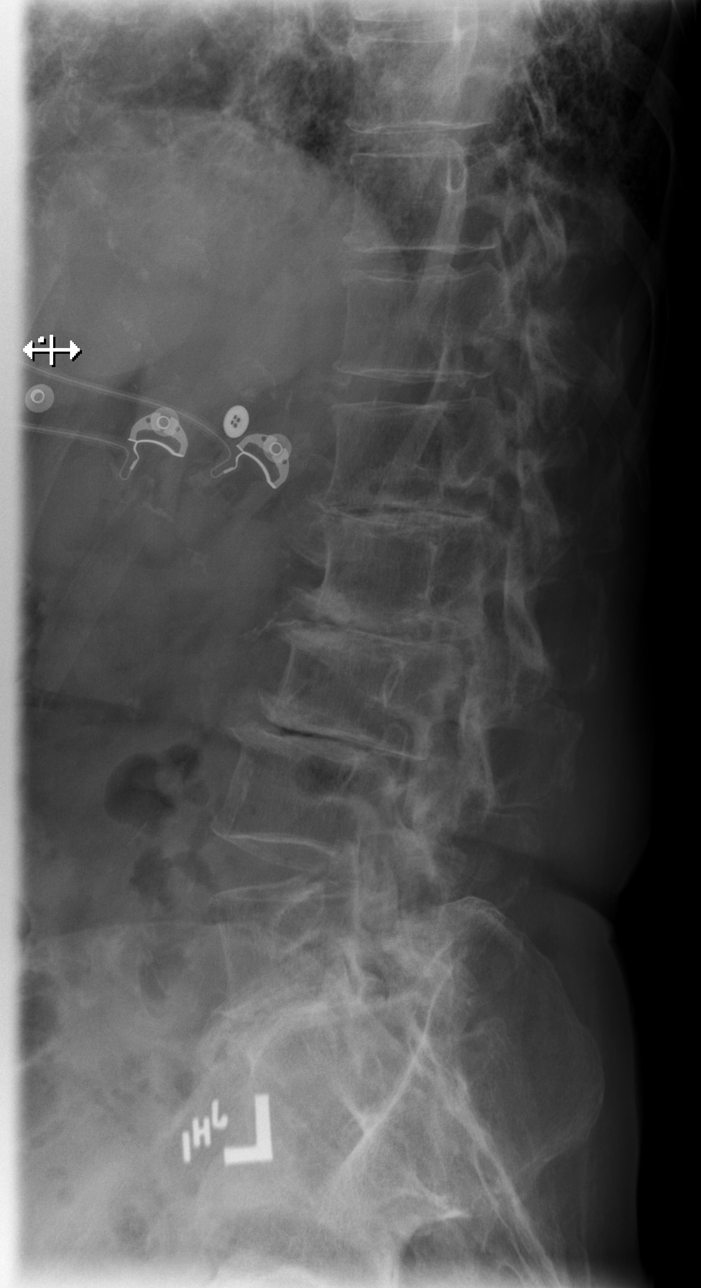

[t lumbar l-5 s-1 spot]
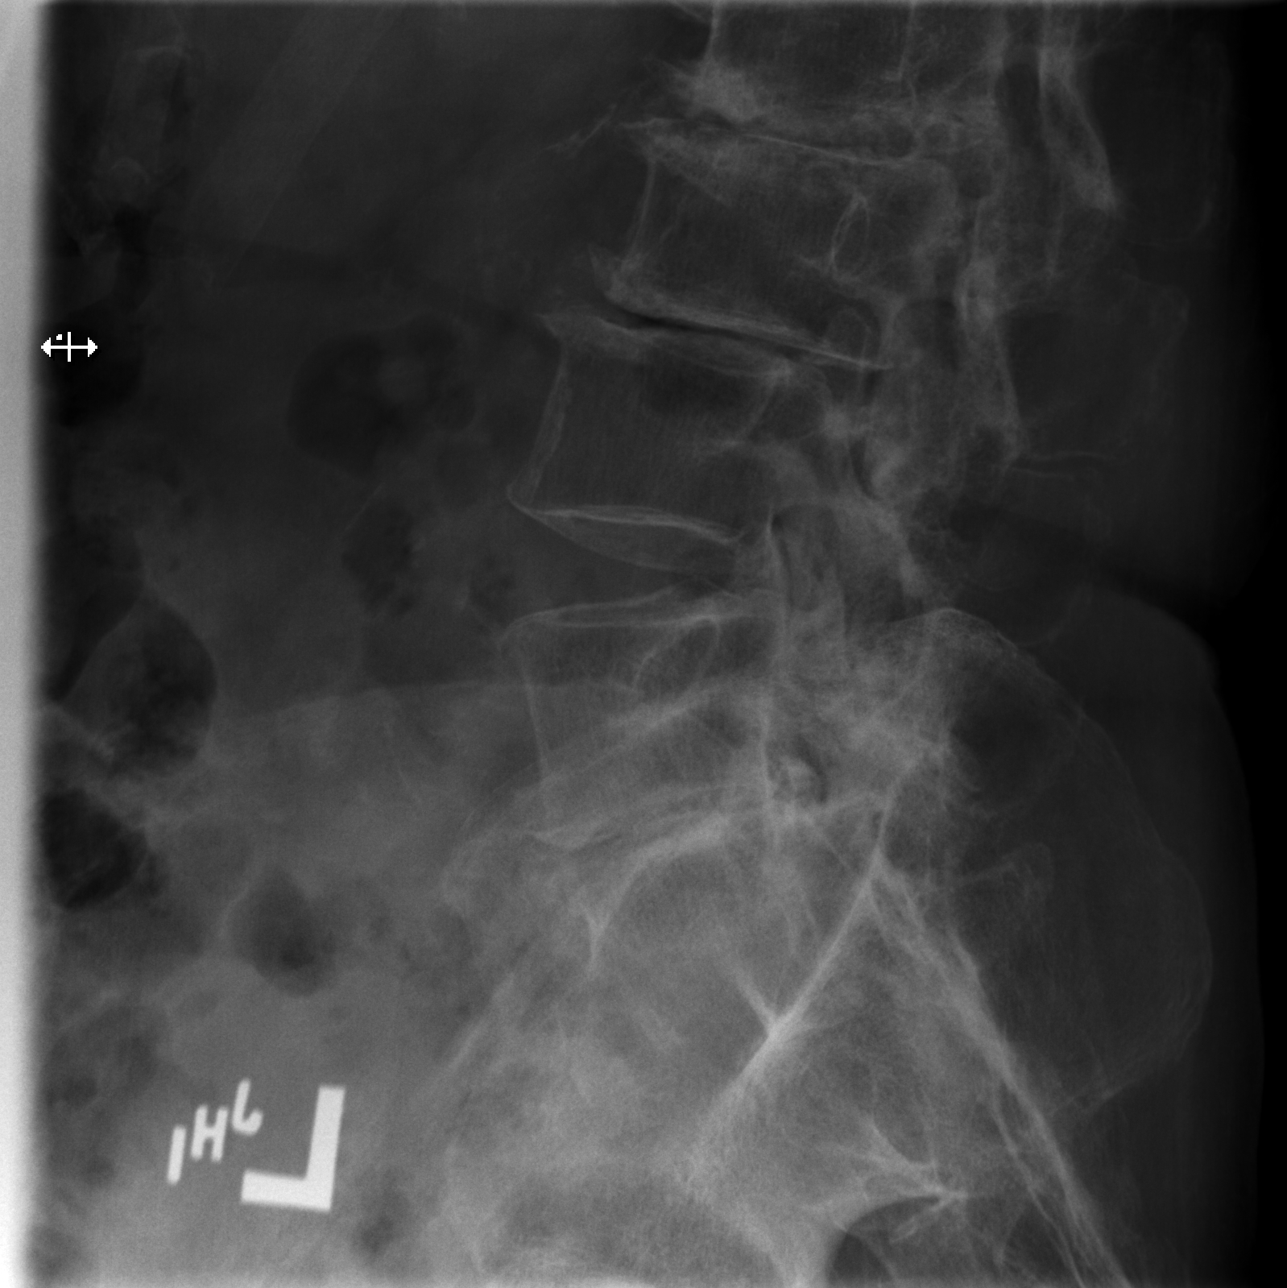

[5 of 5 positions shown; findings below may reference images not displayed]

FINDINGS: Mild to moderate levoscoliosis of the lumbar spine. SI joint
disease. 8 mm retrolisthesis of L3 on L4. Lumbar vertebral body
heights are maintained. Advanced degenerative changes at L1-L2,
L2-L3 and L3-L4, and L5-S1. Extensive vascular calcifications in the
aorta and left upper quadrant branch vessels
IMPRESSION: Moderate levoscoliosis of the lumbar spine with marked degenerative
changes. No acute osseous abnormality
